# Patient Record
Sex: Female | Born: 1975
Health system: Southern US, Community
[De-identification: ages and names within clinical notes are randomized; demographics above are authoritative.]

## PROBLEM LIST (undated history)

## (undated) ENCOUNTER — Inpatient Hospital Stay (HOSPITAL_COMMUNITY): Payer: 59

## (undated) DIAGNOSIS — M791 Myalgia, unspecified site: Secondary | ICD-10-CM

## (undated) DIAGNOSIS — Z8489 Family history of other specified conditions: Secondary | ICD-10-CM

## (undated) DIAGNOSIS — Z800 Family history of malignant neoplasm of digestive organs: Secondary | ICD-10-CM

## (undated) DIAGNOSIS — F419 Anxiety disorder, unspecified: Secondary | ICD-10-CM

## (undated) DIAGNOSIS — B019 Varicella without complication: Secondary | ICD-10-CM

## (undated) DIAGNOSIS — F32A Depression, unspecified: Secondary | ICD-10-CM

## (undated) DIAGNOSIS — T7840XA Allergy, unspecified, initial encounter: Secondary | ICD-10-CM

## (undated) DIAGNOSIS — N809 Endometriosis, unspecified: Secondary | ICD-10-CM

## (undated) DIAGNOSIS — M1990 Unspecified osteoarthritis, unspecified site: Secondary | ICD-10-CM

## (undated) DIAGNOSIS — G43909 Migraine, unspecified, not intractable, without status migrainosus: Secondary | ICD-10-CM

## (undated) DIAGNOSIS — Z803 Family history of malignant neoplasm of breast: Secondary | ICD-10-CM

## (undated) DIAGNOSIS — Z808 Family history of malignant neoplasm of other organs or systems: Secondary | ICD-10-CM

## (undated) DIAGNOSIS — Z8051 Family history of malignant neoplasm of kidney: Secondary | ICD-10-CM

## (undated) DIAGNOSIS — M545 Low back pain, unspecified: Secondary | ICD-10-CM

## (undated) DIAGNOSIS — K219 Gastro-esophageal reflux disease without esophagitis: Secondary | ICD-10-CM

## (undated) DIAGNOSIS — N390 Urinary tract infection, site not specified: Secondary | ICD-10-CM

## (undated) DIAGNOSIS — J189 Pneumonia, unspecified organism: Secondary | ICD-10-CM

## (undated) DIAGNOSIS — N6019 Diffuse cystic mastopathy of unspecified breast: Secondary | ICD-10-CM

## (undated) DIAGNOSIS — F329 Major depressive disorder, single episode, unspecified: Secondary | ICD-10-CM

## (undated) DIAGNOSIS — C801 Malignant (primary) neoplasm, unspecified: Secondary | ICD-10-CM

## (undated) DIAGNOSIS — C50919 Malignant neoplasm of unspecified site of unspecified female breast: Secondary | ICD-10-CM

## (undated) DIAGNOSIS — M722 Plantar fascial fibromatosis: Secondary | ICD-10-CM

## (undated) DIAGNOSIS — Z923 Personal history of irradiation: Secondary | ICD-10-CM

## (undated) DIAGNOSIS — Z1379 Encounter for other screening for genetic and chromosomal anomalies: Secondary | ICD-10-CM

## (undated) HISTORY — DX: Myalgia, unspecified site: M79.10

## (undated) HISTORY — DX: Low back pain: M54.5

## (undated) HISTORY — DX: Varicella without complication: B01.9

## (undated) HISTORY — PX: BREAST SURGERY: SHX581

## (undated) HISTORY — PX: OTHER SURGICAL HISTORY: SHX169

## (undated) HISTORY — DX: Family history of malignant neoplasm of kidney: Z80.51

## (undated) HISTORY — DX: Plantar fascial fibromatosis: M72.2

## (undated) HISTORY — DX: Migraine, unspecified, not intractable, without status migrainosus: G43.909

## (undated) HISTORY — DX: Family history of malignant neoplasm of breast: Z80.3

## (undated) HISTORY — PX: SHOULDER SURGERY: SHX246

## (undated) HISTORY — DX: Major depressive disorder, single episode, unspecified: F32.9

## (undated) HISTORY — DX: Family history of malignant neoplasm of other organs or systems: Z80.8

## (undated) HISTORY — DX: Urinary tract infection, site not specified: N39.0

## (undated) HISTORY — DX: Depression, unspecified: F32.A

## (undated) HISTORY — DX: Family history of malignant neoplasm of digestive organs: Z80.0

## (undated) HISTORY — DX: Anxiety disorder, unspecified: F41.9

## (undated) HISTORY — DX: Endometriosis, unspecified: N80.9

## (undated) HISTORY — DX: Low back pain, unspecified: M54.50

## (undated) HISTORY — DX: Diffuse cystic mastopathy of unspecified breast: N60.19

## (undated) HISTORY — DX: Allergy, unspecified, initial encounter: T78.40XA

---
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
## 2002-02-24 ENCOUNTER — Encounter: Payer: Self-pay | Admitting: Obstetrics and Gynecology

## 2002-02-24 ENCOUNTER — Encounter: Admission: RE | Admit: 2002-02-24 | Discharge: 2002-02-24 | Payer: Self-pay | Admitting: Obstetrics and Gynecology

## 2002-06-15 HISTORY — PX: LAPAROSCOPY: SHX197

## 2002-09-29 ENCOUNTER — Encounter: Admission: RE | Admit: 2002-09-29 | Discharge: 2002-12-28 | Payer: Self-pay | Admitting: Family Medicine

## 2003-03-24 ENCOUNTER — Encounter: Payer: Self-pay | Admitting: Family Medicine

## 2003-03-24 ENCOUNTER — Encounter: Admission: RE | Admit: 2003-03-24 | Discharge: 2003-03-24 | Payer: Self-pay | Admitting: Family Medicine

## 2003-06-09 ENCOUNTER — Encounter: Payer: Self-pay | Admitting: Obstetrics and Gynecology

## 2003-06-09 ENCOUNTER — Encounter: Admission: RE | Admit: 2003-06-09 | Discharge: 2003-06-09 | Payer: Self-pay | Admitting: Obstetrics and Gynecology

## 2004-02-27 ENCOUNTER — Emergency Department (HOSPITAL_COMMUNITY): Admission: EM | Admit: 2004-02-27 | Discharge: 2004-02-27 | Payer: Self-pay | Admitting: Emergency Medicine

## 2004-07-18 ENCOUNTER — Encounter: Admission: RE | Admit: 2004-07-18 | Discharge: 2004-07-18 | Payer: Self-pay | Admitting: Family Medicine

## 2004-09-27 ENCOUNTER — Ambulatory Visit: Payer: Self-pay | Admitting: *Deleted

## 2004-10-04 ENCOUNTER — Ambulatory Visit: Payer: Self-pay | Admitting: Internal Medicine

## 2004-10-04 ENCOUNTER — Ambulatory Visit: Payer: Self-pay | Admitting: *Deleted

## 2004-10-11 ENCOUNTER — Ambulatory Visit: Payer: Self-pay | Admitting: *Deleted

## 2004-10-18 ENCOUNTER — Ambulatory Visit: Payer: Self-pay | Admitting: *Deleted

## 2004-11-01 ENCOUNTER — Ambulatory Visit: Payer: Self-pay | Admitting: *Deleted

## 2004-11-08 ENCOUNTER — Ambulatory Visit: Payer: Self-pay | Admitting: *Deleted

## 2004-11-15 ENCOUNTER — Ambulatory Visit: Payer: Self-pay | Admitting: *Deleted

## 2004-11-22 ENCOUNTER — Ambulatory Visit: Payer: Self-pay | Admitting: *Deleted

## 2004-11-29 ENCOUNTER — Ambulatory Visit: Payer: Self-pay | Admitting: *Deleted

## 2004-12-26 ENCOUNTER — Ambulatory Visit: Payer: Self-pay | Admitting: *Deleted

## 2005-01-09 ENCOUNTER — Ambulatory Visit: Payer: Self-pay | Admitting: Family Medicine

## 2005-01-20 ENCOUNTER — Ambulatory Visit: Payer: Self-pay | Admitting: *Deleted

## 2005-03-13 ENCOUNTER — Ambulatory Visit: Payer: Self-pay | Admitting: Family Medicine

## 2005-04-21 ENCOUNTER — Ambulatory Visit: Payer: Self-pay | Admitting: Family Medicine

## 2005-04-23 ENCOUNTER — Ambulatory Visit: Payer: Self-pay | Admitting: Family Medicine

## 2005-04-23 ENCOUNTER — Encounter: Admission: RE | Admit: 2005-04-23 | Discharge: 2005-04-23 | Payer: Self-pay | Admitting: Family Medicine

## 2005-05-15 ENCOUNTER — Ambulatory Visit: Payer: Self-pay | Admitting: Family Medicine

## 2005-06-17 ENCOUNTER — Ambulatory Visit: Payer: Self-pay | Admitting: Family Medicine

## 2005-06-23 ENCOUNTER — Ambulatory Visit: Payer: Self-pay | Admitting: Family Medicine

## 2005-08-05 ENCOUNTER — Ambulatory Visit: Payer: Self-pay | Admitting: Family Medicine

## 2005-09-03 ENCOUNTER — Encounter: Payer: Self-pay | Admitting: Family Medicine

## 2005-09-10 ENCOUNTER — Encounter: Admission: RE | Admit: 2005-09-10 | Discharge: 2005-09-10 | Payer: Self-pay | Admitting: Orthopedic Surgery

## 2005-09-12 ENCOUNTER — Ambulatory Visit: Payer: Self-pay | Admitting: Family Medicine

## 2005-09-12 ENCOUNTER — Other Ambulatory Visit: Admission: RE | Admit: 2005-09-12 | Discharge: 2005-09-12 | Payer: Self-pay | Admitting: Family Medicine

## 2005-09-12 ENCOUNTER — Encounter: Payer: Self-pay | Admitting: Family Medicine

## 2005-10-22 ENCOUNTER — Ambulatory Visit: Payer: Self-pay | Admitting: Family Medicine

## 2005-10-28 ENCOUNTER — Encounter: Admission: RE | Admit: 2005-10-28 | Discharge: 2005-10-28 | Payer: Self-pay | Admitting: Orthopedic Surgery

## 2005-11-18 ENCOUNTER — Ambulatory Visit: Payer: Self-pay | Admitting: Family Medicine

## 2006-01-01 ENCOUNTER — Encounter: Admission: RE | Admit: 2006-01-01 | Discharge: 2006-01-01 | Payer: Self-pay | Admitting: Family Medicine

## 2006-01-13 HISTORY — PX: BREAST BIOPSY: SHX20

## 2006-01-15 ENCOUNTER — Encounter (INDEPENDENT_AMBULATORY_CARE_PROVIDER_SITE_OTHER): Payer: Self-pay | Admitting: Specialist

## 2006-01-15 ENCOUNTER — Encounter: Admission: RE | Admit: 2006-01-15 | Discharge: 2006-01-15 | Payer: Self-pay | Admitting: Family Medicine

## 2006-02-12 ENCOUNTER — Ambulatory Visit: Payer: Self-pay | Admitting: Family Medicine

## 2006-03-09 ENCOUNTER — Ambulatory Visit (HOSPITAL_COMMUNITY): Admission: AD | Admit: 2006-03-09 | Discharge: 2006-03-09 | Payer: Self-pay | Admitting: Obstetrics and Gynecology

## 2006-07-22 ENCOUNTER — Ambulatory Visit (HOSPITAL_COMMUNITY): Admission: RE | Admit: 2006-07-22 | Discharge: 2006-07-22 | Payer: Self-pay | Admitting: Obstetrics and Gynecology

## 2006-09-07 ENCOUNTER — Emergency Department (HOSPITAL_COMMUNITY): Admission: EM | Admit: 2006-09-07 | Discharge: 2006-09-07 | Payer: Self-pay | Admitting: Family Medicine

## 2006-09-26 ENCOUNTER — Inpatient Hospital Stay (HOSPITAL_COMMUNITY): Admission: AD | Admit: 2006-09-26 | Discharge: 2006-09-29 | Payer: Self-pay | Admitting: Obstetrics and Gynecology

## 2006-10-28 ENCOUNTER — Ambulatory Visit: Payer: Self-pay | Admitting: Family Medicine

## 2006-11-13 ENCOUNTER — Ambulatory Visit: Payer: Self-pay | Admitting: Family Medicine

## 2006-12-22 ENCOUNTER — Encounter: Payer: Self-pay | Admitting: Family Medicine

## 2006-12-22 DIAGNOSIS — J309 Allergic rhinitis, unspecified: Secondary | ICD-10-CM | POA: Insufficient documentation

## 2006-12-22 DIAGNOSIS — F4323 Adjustment disorder with mixed anxiety and depressed mood: Secondary | ICD-10-CM | POA: Insufficient documentation

## 2006-12-22 DIAGNOSIS — N809 Endometriosis, unspecified: Secondary | ICD-10-CM

## 2006-12-22 DIAGNOSIS — N6019 Diffuse cystic mastopathy of unspecified breast: Secondary | ICD-10-CM

## 2006-12-22 HISTORY — DX: Diffuse cystic mastopathy of unspecified breast: N60.19

## 2006-12-22 HISTORY — DX: Endometriosis, unspecified: N80.9

## 2007-01-08 ENCOUNTER — Ambulatory Visit: Payer: Self-pay | Admitting: Family Medicine

## 2007-01-29 ENCOUNTER — Telehealth (INDEPENDENT_AMBULATORY_CARE_PROVIDER_SITE_OTHER): Payer: Self-pay | Admitting: *Deleted

## 2007-02-10 ENCOUNTER — Ambulatory Visit: Payer: Self-pay | Admitting: Family Medicine

## 2007-02-10 DIAGNOSIS — G4700 Insomnia, unspecified: Secondary | ICD-10-CM | POA: Insufficient documentation

## 2007-02-15 ENCOUNTER — Encounter: Payer: Self-pay | Admitting: Family Medicine

## 2007-03-01 ENCOUNTER — Emergency Department (HOSPITAL_COMMUNITY): Admission: EM | Admit: 2007-03-01 | Discharge: 2007-03-01 | Payer: Self-pay | Admitting: Family Medicine

## 2007-03-17 ENCOUNTER — Encounter: Admission: RE | Admit: 2007-03-17 | Discharge: 2007-03-17 | Payer: Self-pay | Admitting: Orthopedic Surgery

## 2007-04-02 ENCOUNTER — Encounter: Admission: RE | Admit: 2007-04-02 | Discharge: 2007-04-02 | Payer: Self-pay | Admitting: Orthopedic Surgery

## 2007-04-21 ENCOUNTER — Encounter: Admission: RE | Admit: 2007-04-21 | Discharge: 2007-04-21 | Payer: Self-pay | Admitting: Orthopedic Surgery

## 2007-05-04 ENCOUNTER — Telehealth (INDEPENDENT_AMBULATORY_CARE_PROVIDER_SITE_OTHER): Payer: Self-pay | Admitting: *Deleted

## 2007-06-18 ENCOUNTER — Encounter: Payer: Self-pay | Admitting: Family Medicine

## 2007-06-29 ENCOUNTER — Encounter: Payer: Self-pay | Admitting: Family Medicine

## 2007-07-01 ENCOUNTER — Ambulatory Visit: Payer: Self-pay | Admitting: Family Medicine

## 2007-07-01 LAB — CONVERTED CEMR LAB: Rapid Strep: NEGATIVE

## 2007-07-05 ENCOUNTER — Encounter (INDEPENDENT_AMBULATORY_CARE_PROVIDER_SITE_OTHER): Payer: Self-pay | Admitting: Internal Medicine

## 2007-07-16 ENCOUNTER — Ambulatory Visit: Payer: Self-pay | Admitting: Family Medicine

## 2007-07-22 ENCOUNTER — Encounter: Admission: RE | Admit: 2007-07-22 | Discharge: 2007-07-22 | Payer: Self-pay | Admitting: Obstetrics and Gynecology

## 2007-10-22 ENCOUNTER — Telehealth: Payer: Self-pay | Admitting: Family Medicine

## 2007-11-08 ENCOUNTER — Ambulatory Visit: Payer: Self-pay | Admitting: Family Medicine

## 2007-11-09 ENCOUNTER — Telehealth: Payer: Self-pay | Admitting: Family Medicine

## 2007-11-11 ENCOUNTER — Ambulatory Visit: Payer: Self-pay | Admitting: Family Medicine

## 2007-11-13 ENCOUNTER — Ambulatory Visit: Payer: Self-pay | Admitting: Family Medicine

## 2008-05-01 ENCOUNTER — Telehealth: Payer: Self-pay | Admitting: Family Medicine

## 2008-06-30 ENCOUNTER — Ambulatory Visit: Payer: Self-pay | Admitting: Family Medicine

## 2008-12-01 ENCOUNTER — Ambulatory Visit: Payer: Self-pay | Admitting: Family Medicine

## 2008-12-06 ENCOUNTER — Ambulatory Visit: Payer: Self-pay | Admitting: Family Medicine

## 2008-12-07 LAB — CONVERTED CEMR LAB
Basophils Relative: 1.1 % (ref 0.0–3.0)
CO2: 29 meq/L (ref 19–32)
Calcium: 9.1 mg/dL (ref 8.4–10.5)
Creatinine, Ser: 0.7 mg/dL (ref 0.4–1.2)
Eosinophils Relative: 1.5 % (ref 0.0–5.0)
HCT: 42.6 % (ref 36.0–46.0)
Hemoglobin: 14.8 g/dL (ref 12.0–15.0)
Lymphs Abs: 2.7 10*3/uL (ref 0.7–4.0)
Monocytes Absolute: 0.6 10*3/uL (ref 0.1–1.0)
Monocytes Relative: 10.5 % (ref 3.0–12.0)
RBC: 4.73 M/uL (ref 3.87–5.11)
TSH: 0.69 microintl units/mL (ref 0.35–5.50)
WBC: 6.1 10*3/uL (ref 4.5–10.5)

## 2008-12-08 ENCOUNTER — Telehealth (INDEPENDENT_AMBULATORY_CARE_PROVIDER_SITE_OTHER): Payer: Self-pay | Admitting: *Deleted

## 2009-01-08 ENCOUNTER — Telehealth: Payer: Self-pay | Admitting: Family Medicine

## 2009-01-16 ENCOUNTER — Ambulatory Visit: Payer: Self-pay | Admitting: Family Medicine

## 2009-06-28 ENCOUNTER — Telehealth: Payer: Self-pay | Admitting: Family Medicine

## 2009-08-02 ENCOUNTER — Ambulatory Visit: Payer: Self-pay | Admitting: Family Medicine

## 2009-08-02 LAB — CONVERTED CEMR LAB: Beta hcg, urine, semiquantitative: NEGATIVE

## 2009-11-09 ENCOUNTER — Inpatient Hospital Stay (HOSPITAL_COMMUNITY): Admission: AD | Admit: 2009-11-09 | Discharge: 2009-11-09 | Payer: Self-pay | Admitting: Obstetrics and Gynecology

## 2009-12-05 ENCOUNTER — Ambulatory Visit: Payer: Self-pay | Admitting: Family Medicine

## 2009-12-05 LAB — CONVERTED CEMR LAB: Rapid Strep: NEGATIVE

## 2009-12-08 ENCOUNTER — Emergency Department: Payer: Self-pay | Admitting: Emergency Medicine

## 2009-12-11 ENCOUNTER — Ambulatory Visit: Payer: Self-pay | Admitting: Family Medicine

## 2010-04-17 ENCOUNTER — Ambulatory Visit (HOSPITAL_COMMUNITY): Admission: RE | Admit: 2010-04-17 | Discharge: 2010-04-17 | Payer: Self-pay | Admitting: Obstetrics and Gynecology

## 2010-05-29 ENCOUNTER — Inpatient Hospital Stay (HOSPITAL_COMMUNITY): Admission: AD | Admit: 2010-05-29 | Discharge: 2010-05-29 | Payer: Self-pay | Admitting: Obstetrics and Gynecology

## 2010-06-16 ENCOUNTER — Inpatient Hospital Stay (HOSPITAL_COMMUNITY): Admission: AD | Admit: 2010-06-16 | Discharge: 2010-06-18 | Payer: Self-pay | Admitting: Obstetrics and Gynecology

## 2010-08-12 ENCOUNTER — Ambulatory Visit: Payer: Self-pay | Admitting: Family Medicine

## 2010-08-12 DIAGNOSIS — M722 Plantar fascial fibromatosis: Secondary | ICD-10-CM

## 2010-08-12 HISTORY — DX: Plantar fascial fibromatosis: M72.2
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
## 2010-11-11 ENCOUNTER — Encounter: Payer: Self-pay | Admitting: Family Medicine

## 2010-11-26 NOTE — Letter (Signed)
Summary: [facility]   Imported By: Kassie Mends 11/18/2010 10:01:42  _____________________________________________________________________  External Attachment:    Type:   Image     Comment:   External Document

## 2010-11-28 LAB — RH IMMUNE GLOB WKUP(>/=20WKS)(NOT WOMEN'S HOSP)

## 2010-11-28 LAB — GC/CHLAMYDIA PROBE AMP, GENITAL: GC Probe Amp, Genital: NEGATIVE

## 2010-11-28 LAB — URINE CULTURE: Colony Count: 80000

## 2010-11-28 LAB — WET PREP, GENITAL
Clue Cells Wet Prep HPF POC: NONE SEEN
Trich, Wet Prep: NONE SEEN
Yeast Wet Prep HPF POC: NONE SEEN

## 2010-11-28 LAB — CBC
HCT: 35.6 % — ABNORMAL LOW (ref 36.0–46.0)
HCT: 39 % (ref 36.0–46.0)
Hemoglobin: 13.2 g/dL (ref 12.0–15.0)
MCH: 31.6 pg (ref 26.0–34.0)
MCV: 93.1 fL (ref 78.0–100.0)
Platelets: 201 10*3/uL (ref 150–400)
RBC: 3.81 MIL/uL — ABNORMAL LOW (ref 3.87–5.11)
RDW: 13.9 % (ref 11.5–15.5)
WBC: 10 10*3/uL (ref 4.0–10.5)

## 2010-11-28 LAB — STREP B DNA PROBE

## 2010-11-28 LAB — URINALYSIS, ROUTINE W REFLEX MICROSCOPIC
Glucose, UA: NEGATIVE mg/dL
Protein, ur: NEGATIVE mg/dL
Specific Gravity, Urine: 1.01 (ref 1.005–1.030)
Urobilinogen, UA: 0.2 mg/dL (ref 0.0–1.0)

## 2010-11-28 LAB — URINE MICROSCOPIC-ADD ON

## 2010-11-29 LAB — RH IMMUNE GLOBULIN WORKUP (NOT WOMEN'S HOSP): Antibody Screen: NEGATIVE

## 2010-12-04 LAB — HCG, QUANTITATIVE, PREGNANCY: hCG, Beta Chain, Quant, S: 18105 m[IU]/mL — ABNORMAL HIGH (ref <5)

## 2010-12-04 LAB — RH IMMUNE GLOBULIN WORKUP (NOT WOMEN'S HOSP)

## 2011-01-02 ENCOUNTER — Telehealth: Payer: Self-pay | Admitting: *Deleted

## 2011-01-02 NOTE — Telephone Encounter (Signed)
Patient notified as instructed by telephone. Pt already has appt scheduled for 01/20/11. Margaret Rojas put pt on the cancellation list to call if appt becomes available before 01/20/11. Pt also understands if cannot wait until the 7th may possibly be worked in with one of the other drs here.

## 2011-01-02 NOTE — Telephone Encounter (Signed)
Pt is taking 100 mg's of zoloft daily but she is having problems with anxiety. She is asking if she can have something to take in addition to that to help her.  She is losing her job and is having a lot of stress.  [facility].

## 2011-01-02 NOTE — Telephone Encounter (Signed)
Needs office visit to discuss symptoms in detail and make treatment plan

## 2011-01-03 ENCOUNTER — Encounter: Payer: Self-pay | Admitting: Family Medicine

## 2011-01-06 ENCOUNTER — Ambulatory Visit (INDEPENDENT_AMBULATORY_CARE_PROVIDER_SITE_OTHER): Payer: 59 | Admitting: Family Medicine

## 2011-01-06 ENCOUNTER — Encounter: Payer: Self-pay | Admitting: Family Medicine

## 2011-01-06 DIAGNOSIS — F411 Generalized anxiety disorder: Secondary | ICD-10-CM

## 2011-01-06 DIAGNOSIS — L709 Acne, unspecified: Secondary | ICD-10-CM | POA: Insufficient documentation

## 2011-01-06 DIAGNOSIS — G4709 Other insomnia: Secondary | ICD-10-CM

## 2011-01-06 DIAGNOSIS — L708 Other acne: Secondary | ICD-10-CM

## 2011-01-06 MED ORDER — ZOLPIDEM TARTRATE 10 MG PO TABS: 5.0000 mg | ORAL_TABLET | Freq: Every evening | ORAL | Status: DC | PRN

## 2011-01-06 MED ORDER — SERTRALINE HCL 100 MG PO TABS: 150.0000 mg | ORAL_TABLET | Freq: Every day | ORAL | Status: DC

## 2011-01-06 MED ORDER — ALPRAZOLAM 0.5 MG PO TABS: 0.5000 mg | ORAL_TABLET | Freq: Every day | ORAL | Status: DC | PRN

## 2011-01-06 MED ORDER — ADAPALENE-BENZOYL PEROXIDE 0.1-2.5 % EX GEL: 1.0000 | Freq: Every evening | CUTANEOUS | Status: AC | PRN

## 2011-01-06 NOTE — Assessment & Plan Note (Signed)
refil epiduo for prn use on acne/ aff areas Pt is on OC also

## 2011-01-06 NOTE — Assessment & Plan Note (Signed)
Worse with increased situational stressors Overall fair coping techniques Disc opt for tx in detail >25 min spent with face to face with patient counseling and coordinating care  ref to counseling - Dr Laymond Purser if covered Inc zoloft to 150 mg- watching for side eff Xanax .5 for emergencies only-- disc cautions of this - habit forming and sedating - not long term  Also refilled ambien when needed

## 2011-01-06 NOTE — Patient Instructions (Signed)
We will do counseling referral at check out  Increase zoloft to 150 mg - update me if any problems Follow up with me in 3 months Use xanax for emergencies as needed - do not drive or mix with [place]
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
## 2011-01-06 NOTE — Assessment & Plan Note (Signed)
Worsened by stress refil ambien for prn use - will not use on same day as xanax Disc sleep hygiene Counseling ref made

## 2011-01-20 ENCOUNTER — Ambulatory Visit: Payer: Self-pay | Admitting: Family Medicine

## 2011-01-23 ENCOUNTER — Other Ambulatory Visit: Payer: Self-pay | Admitting: *Deleted

## 2011-01-23 MED ORDER — ALPRAZOLAM 0.5 MG PO TABS: 0.5000 mg | ORAL_TABLET | Freq: Every day | ORAL | Status: AC | PRN

## 2011-01-23 NOTE — Telephone Encounter (Signed)
Px written for call in   

## 2011-01-23 NOTE — Telephone Encounter (Signed)
Medication phoned to [facility] as instructed.  

## 2011-01-23 NOTE — Telephone Encounter (Signed)
PLEASE CALL INTO [facility], [facility] IS NOT WORKING.

## 2011-01-24 ENCOUNTER — Other Ambulatory Visit: Payer: Self-pay | Admitting: *Deleted

## 2011-01-24 NOTE — Telephone Encounter (Signed)
Spoke with pharmacist at [facility] and pt did have refills so Kendal Hymen said to disregard refill request.

## 2011-01-24 NOTE — Telephone Encounter (Signed)
Did that just get refilled on 4/23 for 30 with 3 refils?

## 2011-01-28 ENCOUNTER — Ambulatory Visit (INDEPENDENT_AMBULATORY_CARE_PROVIDER_SITE_OTHER): Payer: 59 | Admitting: Psychology

## 2011-01-28 DIAGNOSIS — F4323 Adjustment disorder with mixed anxiety and depressed mood: Secondary | ICD-10-CM

## 2011-01-28 NOTE — Assessment & Plan Note (Signed)
[facility]                                 ON-CALL NOTE   DASJA, BRASE                 MRN:          [id number]  DATE:11/13/2007                            DOB:          Oct 01, 1975    TIME OF CALL:  5:24 p.m.   PHONE NUMBER:  [phone].   OBJECTIVE:  The patient thinks she has pinkeye.  Saw Dr. Milinda Antis on Monday  and saw Everrett Coombe, FNP, yesterday, Friday.  Woke up this morning with  itching.  Works at [employer]. .  Was told by 3 nurses she has pinkeye.   ASSESSMENT:  Upper respiratory infection with possible conjunctivitis.   PLAN:  Come in to be seen.   PRIMARY CARE Trenna Kiely:  Dr. Milinda Antis.  Home office is [facility].     Arta Silence, MD  Electronically Signed    RNS/MedQ  DD: 11/13/2007  DT: 11/14/2007  Job #: [id number]

## 2011-01-28 NOTE — Assessment & Plan Note (Signed)
[facility]                                 ON-CALL NOTE   Margaret Rojas, Margaret Rojas               MRN:          [id number]  DATE:11/12/2007                            DOB:          [birth year]    DATE/TIME:  November 12, 2007 at 5:24 p.m.   PHONE:  [phone].   The patient was seen by Dr. Milinda Antis on Monday with a URI, and then again  yesterday by [facility], has not been given antibiotics and sounds  reasonably clear.  She woke up this morning with itching of her eye  which progressed to erythema throughout the day.  She works at [employer] and was told by 3 nurses that she has pink eye, wants  to know if she can be treated, if I will call in the antibiotic.   OBJECTIVE:  Conjunctivitis, most likely viral versus bacterial.   PLAN:  Need to be seen in order to rule out other things.  Probably will  prescribed antibiotics but want to see her first.  We will see her  tomorrow morning at [facility] at 10:30.   PRIMARY CARE PHYSICIAN:  Dr. Milinda Antis and home office is [facility]     Arta Silence, MD  Electronically Signed    RNS/MedQ  DD: 11/12/2007  DT: 11/13/2007  Job #: [id number]
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
## 2011-02-06 ENCOUNTER — Ambulatory Visit: Payer: 59 | Admitting: Psychology

## 2011-02-12 ENCOUNTER — Ambulatory Visit: Payer: 59 | Admitting: Psychology

## 2011-06-11 ENCOUNTER — Telehealth: Payer: Self-pay | Admitting: *Deleted

## 2011-06-11 NOTE — Telephone Encounter (Signed)
Pt has dropped off a copy of labs from city [place].  She is asking that you review and advise on whether she should be concerned about anything.  This is on your shelf.

## 2011-06-11 NOTE — Telephone Encounter (Signed)
Cholesterol is mildly high with LDL in 150s Avoid red meat/ fried foods/ egg yolks/ fatty breakfast meats/ butter, cheese and high fat dairy/ and shellfish  Come in to disc when able

## 2011-06-13 ENCOUNTER — Telehealth: Payer: Self-pay | Admitting: *Deleted

## 2011-06-13 NOTE — Telephone Encounter (Signed)
Patient advised of results and will schedule appt

## 2011-06-16 NOTE — Telephone Encounter (Signed)
error 

## 2011-07-11 ENCOUNTER — Other Ambulatory Visit: Payer: Self-pay | Admitting: *Deleted

## 2011-07-11 MED ORDER — ZOLPIDEM TARTRATE 10 MG PO TABS: 5.0000 mg | ORAL_TABLET | Freq: Every evening | ORAL | Status: DC | PRN

## 2011-07-11 NOTE — Telephone Encounter (Signed)
Px written for call in   

## 2011-07-11 NOTE — Telephone Encounter (Signed)
Rx called in as directed.   

## 2011-07-11 NOTE — Telephone Encounter (Signed)
Faxed request from [facility] [address], last filled 06/13/11.

## 2011-08-12 ENCOUNTER — Ambulatory Visit (INDEPENDENT_AMBULATORY_CARE_PROVIDER_SITE_OTHER): Payer: PRIVATE HEALTH INSURANCE | Admitting: Family Medicine

## 2011-08-12 ENCOUNTER — Encounter: Payer: Self-pay | Admitting: Family Medicine

## 2011-08-12 DIAGNOSIS — R05 Cough: Secondary | ICD-10-CM

## 2011-08-12 MED ORDER — BENZONATATE 200 MG PO CAPS: 200.0000 mg | ORAL_CAPSULE | Freq: Three times a day (TID) | ORAL | Status: AC | PRN

## 2011-08-12 MED ORDER — AZITHROMYCIN 250 MG PO TABS: ORAL_TABLET | ORAL | Status: AC

## 2011-08-12 NOTE — Progress Notes (Signed)
duration of symptoms: 6 weeks of waxing and waning sx.  Saturday night, vomiting with fever.  [facility] and was told to f/u with PMD or primary clinic.   Rhinorrhea: yes, intermittently Congestion: yes ear pain: no sore throat: yes, from the cough Cough: yes, occ sputum Myalgias: yes, esp in back Fatigued.  No h/o asthma.  No known wheeze.  Pain with deep breath.   Sweats noted by patient.   1 cig a day.  Working on quitting.   ROS: See HPI.  Otherwise negative.    Meds, vitals, and allergies reviewed.   GEN: nad, alert and oriented HEENT: mucous membranes moist, TM w/o erythema, nasal epithelium injected, OP with cobblestoning but no exudates.  NECK: supple w/o LA CV: rrr. PULM: ctab, no inc wob but cough noted ABD: soft, +bs EXT: no edema

## 2011-08-12 NOTE — Patient Instructions (Signed)
Start the zithromax today and this should gradually get better.  Take the tessalon for cough.  Let us know if you don't improve over the next week.

## 2011-08-13 ENCOUNTER — Encounter: Payer: Self-pay | Admitting: Family Medicine

## 2011-08-13 NOTE — Assessment & Plan Note (Signed)
Nontoxic with clear lungs but given the duration, I would treat for a presumed atypical pna and then f/u prn.  She agrees. Tessalon for cough.  Okay for outpatient f/u.

## 2011-09-05 ENCOUNTER — Ambulatory Visit (INDEPENDENT_AMBULATORY_CARE_PROVIDER_SITE_OTHER): Payer: 59 | Admitting: Family Medicine

## 2011-09-05 ENCOUNTER — Encounter: Payer: Self-pay | Admitting: Family Medicine

## 2011-09-05 VITALS — BP 122/76 | HR 72 | Temp 98.4°F | Wt 184.2 lb

## 2011-09-05 DIAGNOSIS — IMO0002 Reserved for concepts with insufficient information to code with codable children: Secondary | ICD-10-CM

## 2011-09-05 DIAGNOSIS — S29011A Strain of muscle and tendon of front wall of thorax, initial encounter: Secondary | ICD-10-CM

## 2011-09-05 MED ORDER — TRAMADOL HCL 50 MG PO TABS: 50.0000 mg | ORAL_TABLET | Freq: Three times a day (TID) | ORAL | Status: AC | PRN

## 2011-09-05 MED ORDER — PSEUDOEPHEDRINE-GUAIFENESIN ER 120-1200 MG PO TB12: 1.0000 | ORAL_TABLET | Freq: Two times a day (BID) | ORAL | Status: DC | PRN

## 2011-09-05 NOTE — Assessment & Plan Note (Signed)
From coughing, lungs clear. Treat with tramadol Update [place] if not improving as expected.

## 2011-09-05 NOTE — Progress Notes (Signed)
  Subjective:    Patient ID: Margaret Rojas, female    DOB: May 23, 1976, 35 y.o.   MRN: [id number]  HPI CC: chest soreness  1 wk h/o left side chest wall pain.  H/o cough for last 1 1/2 mo (see below).  Pain worse with coughing.  Not currently on cough syrup other than robitussin.  Takes ambien at bedtime so hesitant to take stronger med.  Last 2 days noticing increased SOB.  Left chest staying sore.  So far has tried nothing for this other than ibuprofen.  Seen here late 07/2011 with walking PNA, placed on zpack.   08/23/2011 had sinus infection, went to [facility] and placed on augmentin for sinusitis.  Review of Systems Per HPI    Objective:   Physical Exam  Nursing note and vitals reviewed. Constitutional: She appears well-developed and well-nourished. No distress.  HENT:  Head: Normocephalic and atraumatic.  Right Ear: Hearing, tympanic membrane, external ear and ear canal normal.  Left Ear: Hearing, tympanic membrane, external ear and ear canal normal.  Nose: No mucosal edema or rhinorrhea. Right sinus exhibits no maxillary sinus tenderness and no frontal sinus tenderness. Left sinus exhibits no maxillary sinus tenderness and no frontal sinus tenderness.  Mouth/Throat: Uvula is midline, oropharynx is clear and moist and mucous membranes are normal. No oropharyngeal exudate, posterior oropharyngeal edema, posterior oropharyngeal erythema or tonsillar abscesses.  Eyes: Conjunctivae and EOM are normal. Pupils are equal, round, and reactive to light. No scleral icterus.  Neck: Normal range of motion. Neck supple.  Cardiovascular: Normal rate, regular rhythm, normal heart sounds and intact distal pulses.   No murmur heard. Pulmonary/Chest: Effort normal and breath sounds normal. No respiratory distress. She has no wheezes. She has no rales. She exhibits tenderness.       Left intercostal between 3rd/4th ribs chest tender to palpation  Lymphadenopathy:    She has no cervical adenopathy.   Skin: Skin is warm and dry. No rash noted.      Assessment & Plan:

## 2011-09-05 NOTE — Patient Instructions (Signed)
I think yo uhave chest wall strain - pulled muscle. Treat with tramadol.  This should also help as cough suppressant. Update [place] if not improving as expected. Sent in mucinex D as well, use nasal saline as well.

## 2011-09-08 ENCOUNTER — Ambulatory Visit: Payer: PRIVATE HEALTH INSURANCE | Admitting: Family Medicine

## 2011-11-11 ENCOUNTER — Ambulatory Visit: Payer: Self-pay | Admitting: Obstetrics and Gynecology

## 2011-11-24 ENCOUNTER — Ambulatory Visit (INDEPENDENT_AMBULATORY_CARE_PROVIDER_SITE_OTHER): Payer: 59 | Admitting: Obstetrics and Gynecology

## 2011-11-24 DIAGNOSIS — R635 Abnormal weight gain: Secondary | ICD-10-CM

## 2011-11-24 DIAGNOSIS — Z01419 Encounter for gynecological examination (general) (routine) without abnormal findings: Secondary | ICD-10-CM

## 2011-11-24 DIAGNOSIS — R5381 Other malaise: Secondary | ICD-10-CM

## 2011-11-24 DIAGNOSIS — E569 Vitamin deficiency, unspecified: Secondary | ICD-10-CM

## 2011-11-24 DIAGNOSIS — R5383 Other fatigue: Secondary | ICD-10-CM

## 2011-11-28 ENCOUNTER — Other Ambulatory Visit: Payer: Self-pay

## 2011-11-28 MED ORDER — ZOLPIDEM TARTRATE 10 MG PO TABS: 5.0000 mg | ORAL_TABLET | Freq: Every evening | ORAL | Status: DC | PRN

## 2011-11-28 NOTE — Telephone Encounter (Signed)
Px written for call in   

## 2011-11-28 NOTE — Telephone Encounter (Signed)
Medication phoned to [facility] as instructed.  

## 2011-11-28 NOTE — Telephone Encounter (Signed)
[facility] faxed request Zolpidem tartrate 10 mg. Pt last seen 09/05/11.Please advise.

## 2011-12-24 ENCOUNTER — Other Ambulatory Visit: Payer: Self-pay

## 2011-12-24 MED ORDER — NORGESTIM-ETH ESTRAD TRIPHASIC 0.18/0.215/0.25 MG-35 MCG PO TABS: 1.0000 | ORAL_TABLET | Freq: Every day | ORAL | Status: DC

## 2011-12-24 NOTE — Telephone Encounter (Signed)
Pt has written rx for Tri Sprintec good for 1 yr; however, she prefers it to be sent to [facility] due to cost. Informed pt will consult with provider. Spoke with pt informed her rx can be rf at [facility] for $9. Pt states she will try [facility]. Pt will call back if she needs [place] to send [facility].

## 2011-12-25 ENCOUNTER — Telehealth: Payer: Self-pay

## 2011-12-25 NOTE — Telephone Encounter (Signed)
Error

## 2012-01-09 ENCOUNTER — Other Ambulatory Visit: Payer: Self-pay | Admitting: *Deleted

## 2012-01-09 ENCOUNTER — Encounter: Payer: 59 | Admitting: Obstetrics and Gynecology

## 2012-01-09 MED ORDER — SERTRALINE HCL 100 MG PO TABS: ORAL_TABLET | ORAL | Status: DC

## 2012-01-09 NOTE — Telephone Encounter (Signed)
Will refill electronically  Needs to f/u with me this spring or summer please Thanks  I think she takes 11/2 daily- let me know if this is wrong

## 2012-01-09 NOTE — Telephone Encounter (Signed)
Patient advised as instructed via telephone, she stated that she has been trying to cut back and only takes one tablet by mouth.  She will call back to schedule f/u appt.

## 2012-01-09 NOTE — Telephone Encounter (Signed)
Last refilled 10/30/2011.

## 2012-02-23 ENCOUNTER — Ambulatory Visit (INDEPENDENT_AMBULATORY_CARE_PROVIDER_SITE_OTHER): Payer: 59 | Admitting: Family Medicine

## 2012-02-23 ENCOUNTER — Encounter: Payer: Self-pay | Admitting: Family Medicine

## 2012-02-23 VITALS — BP 104/62 | HR 64 | Temp 97.8°F | Ht 68.5 in | Wt 185.2 lb

## 2012-02-23 DIAGNOSIS — L719 Rosacea, unspecified: Secondary | ICD-10-CM

## 2012-02-23 DIAGNOSIS — F411 Generalized anxiety disorder: Secondary | ICD-10-CM

## 2012-02-23 DIAGNOSIS — L710 Perioral dermatitis: Secondary | ICD-10-CM

## 2012-02-23 MED ORDER — ZOLPIDEM TARTRATE 10 MG PO TABS: 5.0000 mg | ORAL_TABLET | Freq: Every evening | ORAL | Status: DC | PRN

## 2012-02-23 MED ORDER — METRONIDAZOLE 1 % EX GEL: Freq: Every day | CUTANEOUS | Status: DC

## 2012-02-23 NOTE — Patient Instructions (Addendum)
Take your remaining 50 mg of zoloft and cut to 25 mg - take that once daily for 2 weeks and then stop  I'm glad you are doing well  Try the metro gel for facial rash and update if not improving
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
## 2012-02-24 ENCOUNTER — Telehealth: Payer: Self-pay

## 2012-02-24 MED ORDER — METRONIDAZOLE 0.75 % EX GEL: Freq: Every day | CUTANEOUS | Status: DC

## 2012-02-24 NOTE — Telephone Encounter (Signed)
What do they recommend? Is is a cost issue - please ask the pharmacist- tell them I am treating peri oral dermatitis thanks

## 2012-02-24 NOTE — Telephone Encounter (Signed)
Px written for call in   Thanks  

## 2012-02-24 NOTE — Telephone Encounter (Signed)
Pt called back about metrogel. Spoke with Tim pharmacist [facility]; he recommends Metronidazole 0.75% gel to apply once daily; is generic and less expensive. Pt will ck with pharmacy later tonight or in AM.Please advise.

## 2012-02-24 NOTE — Telephone Encounter (Signed)
Phoned in to pharmacy/[facility]

## 2012-02-24 NOTE — Telephone Encounter (Signed)
[facility] faxed request alternative med for Metrogel topical 1 % gel. Please advise.

## 2012-02-27 NOTE — Assessment & Plan Note (Signed)
Rev use of products without color or fragrance and imp of sunscreen Px metronizadole gel to try once daily-can adv to bid if needed Update if not starting to improve in a week or if worsening

## 2012-02-27 NOTE — Assessment & Plan Note (Signed)
Doing much better with less situational stress  Will continue to cut zoloft to 25 mg qd for 2 wk and then stop  Update if problems or if mood worsens

## 2012-06-14 ENCOUNTER — Telehealth: Payer: Self-pay | Admitting: Family Medicine

## 2012-06-14 NOTE — Telephone Encounter (Signed)
That is fine with me, thanks

## 2012-06-14 NOTE — Telephone Encounter (Signed)
Margaret Rojas is Dr. Milinda Antis pt. They have moved from [place] to [place], so she would like to transfer to Dr. Selena Batten here at [place]. Please let me know if this is ok with the both of you. Thank you.

## 2012-06-14 NOTE — Telephone Encounter (Signed)
Fine with me. Thanks.   Dr. Selena Batten

## 2012-06-18 ENCOUNTER — Encounter: Payer: Self-pay | Admitting: Family Medicine

## 2012-06-18 ENCOUNTER — Ambulatory Visit (INDEPENDENT_AMBULATORY_CARE_PROVIDER_SITE_OTHER): Payer: 59 | Admitting: Family Medicine

## 2012-06-18 VITALS — BP 102/60 | HR 64 | Temp 97.9°F | Ht 68.5 in | Wt 190.0 lb

## 2012-06-18 DIAGNOSIS — Z23 Encounter for immunization: Secondary | ICD-10-CM

## 2012-06-18 DIAGNOSIS — R5381 Other malaise: Secondary | ICD-10-CM

## 2012-06-18 DIAGNOSIS — R5383 Other fatigue: Secondary | ICD-10-CM

## 2012-06-18 DIAGNOSIS — Z0000 Encounter for general adult medical examination without abnormal findings: Secondary | ICD-10-CM

## 2012-06-18 LAB — LIPID PANEL
Cholesterol: 213 mg/dL — ABNORMAL HIGH (ref 0–200)
HDL: 61.8 mg/dL (ref >39.00)
Total CHOL/HDL Ratio: 3
Triglycerides: 113 mg/dL (ref 0.0–149.0)
VLDL: 22.6 mg/dL (ref 0.0–40.0)

## 2012-06-18 LAB — LDL CHOLESTEROL, DIRECT: Direct LDL: 135.2 mg/dL

## 2012-06-18 NOTE — Patient Instructions (Addendum)
-  We have ordered labs or studies at this visit. It usually takes 1-2 weeks for results and processing. We will contact you with instructions IF your results are abnormal. Normal results will be released to your [facility] in 1-2 weeks. If you have not heard from [place] or can not find your results in [facility] in 2 weeks please contact our office.  -schedule follow up appointment in next month for removal of mole on back - PROCEDURE APPOINTMENT    Thank you for enrolling in MyChart. Please follow the instructions below to securely access your online medical record. MyChart allows you to send messages to your doctor, view your test results, renew your prescriptions, schedule appointments, and more.  How Do I Sign Up? 1. In your Internet browser, go to [email or web address] 2. Click on the New  User? link in the Sign In box.  3. Enter your MyChart Access Code exactly as it appears below. You will not need to use this code after you have completed the sign-up process. If you do not sign up before the expiration date, you must request a new code. MyChart Access Code: [email or web address] Expires: 07/18/2012 10:05 AM  4. Enter the last four digits of your Social Security Number (xxxx) and Date of Birth (mm/dd/yyyy) as indicated and click Next. You will be taken to the next sign-up page. 5. Create a MyChart ID. This will be your MyChart login ID and cannot be changed, so think of one that is secure and easy to remember. 6. Create a MyChart password. You can change your password at any time. 7. Enter your Password Reset Question and Answer and click Next. This can be used at a later time if you forget your password.  8. Select your communication preference, and if applicable enter your e-mail address. You will receive e-mail notification when new information is available in MyChart by choosing to receive e-mail notifications and filling in your e-mail. 9. Click Sign In. You can now view your medical  record.   Additional Information If you have questions, you can email [email or web address] or call [phone] to talk to our MyChart staff. Remember, MyChart is NOT to be used for urgent needs. For medical emergencies, dial 911.
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
## 2012-06-21 NOTE — Progress Notes (Signed)
Quick Note:  Left a message for pt to return call. ______ 

## 2012-06-21 NOTE — Progress Notes (Signed)
Quick Note:  Called and spoke with pt and pt is aware that she needs to contact her insurance to see if nutrition counseling is covered and called the office back and a referral will be placed. Pt is aware of lab results and states she will contact the office to schedule an appt. ______

## 2012-07-07 ENCOUNTER — Telehealth: Payer: Self-pay

## 2012-07-07 ENCOUNTER — Other Ambulatory Visit: Payer: Self-pay

## 2012-07-07 MED ORDER — NORGESTIM-ETH ESTRAD TRIPHASIC 0.18/0.215/0.25 MG-35 MCG PO TABS: 1.0000 | ORAL_TABLET | Freq: Every day | ORAL | Status: DC

## 2012-07-07 NOTE — Telephone Encounter (Signed)
Pt called requesting bcp to be called to [facility] .was doing mail order per EP ok . BCP e prescribed to pharmacy. jm

## 2012-07-09 ENCOUNTER — Ambulatory Visit: Payer: 59 | Admitting: Family Medicine

## 2012-07-15 ENCOUNTER — Encounter: Payer: Self-pay | Admitting: Family Medicine

## 2012-07-15 ENCOUNTER — Ambulatory Visit (INDEPENDENT_AMBULATORY_CARE_PROVIDER_SITE_OTHER): Payer: 59 | Admitting: Family Medicine

## 2012-07-15 VITALS — BP 98/68 | HR 109 | Temp 97.8°F | Wt 190.0 lb

## 2012-07-15 DIAGNOSIS — D239 Other benign neoplasm of skin, unspecified: Secondary | ICD-10-CM

## 2012-07-15 DIAGNOSIS — J329 Chronic sinusitis, unspecified: Secondary | ICD-10-CM

## 2012-07-15 DIAGNOSIS — D229 Melanocytic nevi, unspecified: Secondary | ICD-10-CM

## 2012-07-15 MED ORDER — AMOXICILLIN-POT CLAVULANATE 875-125 MG PO TABS: 1.0000 | ORAL_TABLET | Freq: Two times a day (BID) | ORAL | Status: DC

## 2012-07-15 NOTE — Patient Instructions (Addendum)
SKIN LESION:  -keep area clean and dry for 24 hours -after that bath as usual -let us know immediately if any swelling or redness -we will contact you about result sin 1-2 weeks, call us if you have not heard anything in 2 weeks   INSTRUCTIONS FOR UPPER RESPIRATORY INFECTION:  -plenty of rest and fluids  -take antibiotic with food as instructed  -nasal saline wash 2-3 times daily (use prepackaged nasal saline or bottled/distilled water if making your own)   -can use tylenol or ibuprofen as directed for aches and sorethroat  -in the winter time, using a humidifier at night is helpful (please follow cleaning instructions)  -if you are taking a cough medication - use only as directed, may also try a teaspoon of honey to coat the throat and throat lozenges  -for sore throat, salt water gargles can help  -follow up if you have fevers, are worsening or not getting better in 5-7 days

## 2012-07-15 NOTE — Addendum Note (Signed)
Addended by: Azucena Freed on: 07/15/2012 09:08 AM   Modules accepted: Orders
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
## 2012-07-21 ENCOUNTER — Telehealth: Payer: Self-pay | Admitting: Family Medicine

## 2012-07-21 NOTE — Telephone Encounter (Signed)
Called and spoke with pt. Pt states she will have to call back to schedule this appt.

## 2012-07-21 NOTE — Telephone Encounter (Signed)
Please let patient know:  Mole removed show some atypical cells  - but NO cancer.  I would advise an appointment in 2 months to check this area as there may have been some cells outside of the biopsy. If there is any recurrence of the mole we will plan to remove it at that appointment.  Margaret Rojas,  Please make sure she schedules this appointment. Thanks!

## 2012-07-21 NOTE — Telephone Encounter (Signed)
Left a message for pt to return call 

## 2012-08-18 ENCOUNTER — Telehealth: Payer: Self-pay

## 2012-08-18 DIAGNOSIS — G4700 Insomnia, unspecified: Secondary | ICD-10-CM

## 2012-08-18 MED ORDER — ZOLPIDEM TARTRATE 5 MG PO TABS: 5.0000 mg | ORAL_TABLET | Freq: Every evening | ORAL | Status: DC | PRN

## 2012-08-18 NOTE — Telephone Encounter (Signed)
  Margaret Rojas,  Can refill per order. 5mg  tablets so does not have to break in half. Limited supply. Needs visit for sleep prior to further refills.

## 2012-08-18 NOTE — Telephone Encounter (Signed)
Rx request for ambien 10 mg.  Qty: 15; take 1/2 po hs prn. Last written by M. Tower.  Pls advise.

## 2012-08-18 NOTE — Telephone Encounter (Signed)
Called and spoke with pt and pt is aware that Ambien 5 mg will be faxed to pharmacy and an appt is needed for further refills.

## 2012-11-11 ENCOUNTER — Ambulatory Visit (INDEPENDENT_AMBULATORY_CARE_PROVIDER_SITE_OTHER): Payer: 59 | Admitting: Family Medicine

## 2012-11-11 ENCOUNTER — Encounter: Payer: Self-pay | Admitting: Family Medicine

## 2012-11-11 VITALS — BP 100/70 | HR 90 | Wt 186.0 lb

## 2012-11-11 DIAGNOSIS — F411 Generalized anxiety disorder: Secondary | ICD-10-CM

## 2012-11-11 DIAGNOSIS — D229 Melanocytic nevi, unspecified: Secondary | ICD-10-CM

## 2012-11-11 DIAGNOSIS — D239 Other benign neoplasm of skin, unspecified: Secondary | ICD-10-CM

## 2012-11-11 DIAGNOSIS — L709 Acne, unspecified: Secondary | ICD-10-CM

## 2012-11-11 DIAGNOSIS — L708 Other acne: Secondary | ICD-10-CM

## 2012-11-11 DIAGNOSIS — G4700 Insomnia, unspecified: Secondary | ICD-10-CM

## 2012-11-11 MED ORDER — LORAZEPAM 0.5 MG PO TABS: ORAL_TABLET | ORAL | Status: DC

## 2012-11-11 MED ORDER — ADAPALENE-BENZOYL PEROXIDE 0.1-2.5 % EX GEL: CUTANEOUS | Status: DC

## 2012-11-11 NOTE — Patient Instructions (Signed)
-  We placed a referral for you as discussed to the dermatologist. It usually takes about 1-2 weeks to process and schedule this referral. If you have not heard from [place] regarding this appointment in 2 weeks please contact our office.  -for sleep and anxiety: -regular daily exercise -sleep hygeine - cool dark room -bed is for sleep only -get out of bed if tossing and turning more the 15-20 minutes and journal thoughts -use ambien OR ativan very sparingly and do not use together -get counseling  Follow up in 3 months
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
## 2012-12-13 ENCOUNTER — Encounter: Payer: Self-pay | Admitting: Family Medicine

## 2012-12-13 NOTE — Progress Notes (Signed)
Received OV note from dermatology from 3/24 - mole excised.

## 2013-03-16 ENCOUNTER — Encounter: Payer: Self-pay | Admitting: Family Medicine

## 2013-03-16 DIAGNOSIS — G43009 Migraine without aura, not intractable, without status migrainosus: Secondary | ICD-10-CM | POA: Insufficient documentation

## 2013-03-16 DIAGNOSIS — G43909 Migraine, unspecified, not intractable, without status migrainosus: Secondary | ICD-10-CM

## 2013-03-16 HISTORY — DX: Migraine, unspecified, not intractable, without status migrainosus: G43.909

## 2013-03-16 NOTE — Progress Notes (Signed)
REceived OV note from Dr. Neale Burly in Neurology for Migraines. Starting topamax. Using zomig. Scanned in.

## 2013-04-24 ENCOUNTER — Emergency Department (INDEPENDENT_AMBULATORY_CARE_PROVIDER_SITE_OTHER): Payer: 59

## 2013-04-24 ENCOUNTER — Emergency Department (HOSPITAL_COMMUNITY)
Admission: EM | Admit: 2013-04-24 | Discharge: 2013-04-24 | Disposition: A | Discharge status: Home / Self Care | Payer: 59 | Source: Home / Self Care | Attending: Family Medicine | Admitting: Family Medicine

## 2013-04-24 ENCOUNTER — Encounter (HOSPITAL_COMMUNITY): Payer: Self-pay | Admitting: *Deleted

## 2013-04-24 DIAGNOSIS — J189 Pneumonia, unspecified organism: Secondary | ICD-10-CM

## 2013-04-24 LAB — POCT I-STAT, CHEM 8
Creatinine, Ser: 1 mg/dL (ref 0.50–1.10)
Glucose, Bld: 101 mg/dL — ABNORMAL HIGH (ref 70–99)
HCT: 43 % (ref 36.0–46.0)
Hemoglobin: 14.6 g/dL (ref 12.0–15.0)
Potassium: 3.4 mEq/L — ABNORMAL LOW (ref 3.5–5.1)
Sodium: 138 mEq/L (ref 135–145)
TCO2: 21 mmol/L (ref 0–100)

## 2013-04-24 LAB — CBC WITH DIFFERENTIAL/PLATELET
Basophils Relative: 1 % (ref 0–1)
Eosinophils Absolute: 0.1 10*3/uL (ref 0.0–0.7)
Eosinophils Relative: 2 % (ref 0–5)
Lymphs Abs: 2.2 10*3/uL (ref 0.7–4.0)
MCH: 30.2 pg (ref 26.0–34.0)
MCHC: 34.2 g/dL (ref 30.0–36.0)
MCV: 88.4 fL (ref 78.0–100.0)
Monocytes Relative: 15 % — ABNORMAL HIGH (ref 3–12)
Neutrophils Relative %: 43 % (ref 43–77)
Platelets: 201 10*3/uL (ref 150–400)
RBC: 4.73 MIL/uL (ref 3.87–5.11)

## 2013-04-24 MED ORDER — CEFTRIAXONE SODIUM 1 G IJ SOLR
INTRAMUSCULAR | Status: AC
Start: 2013-04-24 — End: 2013-04-24
  Filled 2013-04-24: qty 10

## 2013-04-24 MED ORDER — AZITHROMYCIN 250 MG PO TABS: ORAL_TABLET | ORAL | Status: DC

## 2013-04-24 MED ORDER — ONDANSETRON 4 MG PO TBDP
8.0000 mg | ORAL_TABLET | Freq: Once | ORAL | Status: AC
  Administered 2013-04-24: 8 mg via ORAL

## 2013-04-24 MED ORDER — ONDANSETRON 4 MG PO TBDP: 4.0000 mg | ORAL_TABLET | Freq: Four times a day (QID) | ORAL | Status: DC | PRN

## 2013-04-24 MED ORDER — METHYLPREDNISOLONE 4 MG PO KIT: PACK | ORAL | Status: DC

## 2013-04-24 MED ORDER — CEFTRIAXONE SODIUM 1 G IJ SOLR
1.0000 g | Freq: Once | INTRAMUSCULAR | Status: AC
  Administered 2013-04-24: 1 g via INTRAMUSCULAR

## 2013-04-24 MED ORDER — DEXAMETHASONE SODIUM PHOSPHATE 10 MG/ML IJ SOLN
10.0000 mg | Freq: Once | INTRAMUSCULAR | Status: AC
  Administered 2013-04-24: 10 mg via INTRAMUSCULAR

## 2013-04-24 MED ORDER — GUAIFENESIN-CODEINE 100-10 MG/5ML PO SOLN: 5.0000 mL | Freq: Three times a day (TID) | ORAL | Status: DC | PRN

## 2013-04-24 MED ORDER — DEXAMETHASONE SODIUM PHOSPHATE 10 MG/ML IJ SOLN
INTRAMUSCULAR | Status: AC
  Filled 2013-04-24: qty 1

## 2013-04-24 MED ORDER — ONDANSETRON 4 MG PO TBDP
ORAL_TABLET | ORAL | Status: AC
  Filled 2013-04-24: qty 2

## 2013-04-24 NOTE — ED Notes (Signed)
Pt  Reports   Cough       Body  Aches          With  Some  Nausea   As  Well    Pt  Reports  Was  Seen  3  Days  Ago  And  Was  Dx  With a  Virus  And a  uti   She  Continues to  Have symptoms     And  The  Cough is  Worse     She  Is  Sitting  Upright on  Exam table  Speaking in  Complete   sentances  And  Is  In no  Severe  Distress
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
## 2013-04-25 NOTE — ED Provider Notes (Signed)
Medical screening examination/treatment/procedure(s) were performed by non-physician practitioner and as supervising physician I was immediately available for consultation/collaboration.   [facility]; MDMedical screening examination/treatment/procedure(s) were performed by non-physician practitioner and as supervising physician I was immediately available for consultation/collaboration.   [facility]; MD  Sharin Grave, MD 04/25/13 [phone]

## 2013-05-10 ENCOUNTER — Ambulatory Visit (INDEPENDENT_AMBULATORY_CARE_PROVIDER_SITE_OTHER): Payer: 59 | Admitting: Family Medicine

## 2013-05-10 ENCOUNTER — Encounter: Payer: Self-pay | Admitting: Family Medicine

## 2013-05-10 ENCOUNTER — Ambulatory Visit (INDEPENDENT_AMBULATORY_CARE_PROVIDER_SITE_OTHER)
Admission: RE | Admit: 2013-05-10 | Discharge: 2013-05-10 | Disposition: A | Discharge status: Home / Self Care | Payer: 59 | Source: Ambulatory Visit | Attending: Family Medicine | Admitting: Family Medicine

## 2013-05-10 VITALS — BP 102/62 | HR 87 | Temp 98.7°F | Ht 68.5 in | Wt 180.2 lb

## 2013-05-10 DIAGNOSIS — J189 Pneumonia, unspecified organism: Secondary | ICD-10-CM

## 2013-05-10 DIAGNOSIS — G4700 Insomnia, unspecified: Secondary | ICD-10-CM

## 2013-05-10 DIAGNOSIS — G4709 Other insomnia: Secondary | ICD-10-CM

## 2013-05-10 MED ORDER — ZOLPIDEM TARTRATE 5 MG PO TABS: 5.0000 mg | ORAL_TABLET | Freq: Every evening | ORAL | Status: DC | PRN

## 2013-05-10 NOTE — Assessment & Plan Note (Signed)
S/p ED visit on 8/10 - reviewed note and studies in detail  Improved after Zpack/ medrol and cough med-now just tired with residual dry cough Will adv activity as tolerated  cxr today  Will plan nurse visit in 1 mo for flu and pneumonia vaccines at the same time
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
## 2013-05-10 NOTE — Assessment & Plan Note (Signed)
Refilled ambien as her insomnia was worse on prednisone and she needed to use more of it  Will update if no improvement

## 2013-05-10 NOTE — Patient Instructions (Addendum)
Chest xray re check today  I'm very glad you are no longer smoking  Schedule nurse visit in about a month for a flu shot and pneumonia shot at the same time  Get rest/ drink fluids/ and take care of yourself

## 2013-06-06 ENCOUNTER — Encounter (HOSPITAL_COMMUNITY): Payer: Self-pay

## 2013-06-06 ENCOUNTER — Emergency Department (HOSPITAL_COMMUNITY)
Admission: EM | Admit: 2013-06-06 | Discharge: 2013-06-06 | Disposition: A | Discharge status: Home / Self Care | Payer: 59 | Source: Home / Self Care

## 2013-06-06 DIAGNOSIS — J329 Chronic sinusitis, unspecified: Secondary | ICD-10-CM

## 2013-06-06 MED ORDER — AMOXICILLIN-POT CLAVULANATE 875-125 MG PO TABS: 1.0000 | ORAL_TABLET | Freq: Two times a day (BID) | ORAL | Status: DC
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
## 2013-06-06 NOTE — ED Notes (Signed)
C/o continued cough, congestion

## 2013-06-07 NOTE — ED Provider Notes (Signed)
Medical screening examination/treatment/procedure(s) were performed by a resident physician or non-physician practitioner and as the supervising physician I was immediately available for consultation/collaboration.  Evan Corey, MD    Evan S Corey, MD 06/07/13 0811 

## 2013-06-23 ENCOUNTER — Ambulatory Visit: Payer: 59

## 2013-06-23 ENCOUNTER — Ambulatory Visit (INDEPENDENT_AMBULATORY_CARE_PROVIDER_SITE_OTHER): Payer: 59 | Admitting: *Deleted

## 2013-06-23 DIAGNOSIS — Z23 Encounter for immunization: Secondary | ICD-10-CM

## 2013-06-25 ENCOUNTER — Encounter: Payer: Self-pay | Admitting: Family Medicine

## 2013-06-25 ENCOUNTER — Ambulatory Visit (INDEPENDENT_AMBULATORY_CARE_PROVIDER_SITE_OTHER): Payer: 59 | Admitting: Family Medicine

## 2013-06-25 VITALS — BP 110/72 | HR 81 | Temp 98.2°F

## 2013-06-25 DIAGNOSIS — T7840XA Allergy, unspecified, initial encounter: Secondary | ICD-10-CM

## 2013-06-25 MED ORDER — CEPHALEXIN 500 MG PO CAPS: 500.0000 mg | ORAL_CAPSULE | Freq: Three times a day (TID) | ORAL | Status: AC

## 2013-06-25 NOTE — Progress Notes (Signed)
  Subjective:    Patient ID: Margaret Rojas, female    DOB: Mar 05, 1976, 37 y.o.   MRN: [id number]  HPI Here for a shot reaction in the left upper arm. On 06-22-13 she received a flu shot and a Pneumovax shot. She felt mildly achy last night but no SOB, fever, etc. Then this am the left upper arm was red, swollen, and painful. She is taking Ibuprofen.    Review of Systems  Constitutional: Negative.   HENT: Negative.   Respiratory: Negative.   Cardiovascular: Negative.   Skin: Positive for color change.  Neurological: Negative.        Objective:   Physical Exam  Constitutional: She appears well-developed and well-nourished. No distress.  Skin:  The left upper arm has areas of erythema, warmth, swelling and tenderness          Assessment & Plan:  Local reaction to a pneumonia vaccine. Use ice packs, Benadryl, and Ibuprofen prn. Cover for any cellulitis with Keflex.

## 2013-07-11 ENCOUNTER — Ambulatory Visit (INDEPENDENT_AMBULATORY_CARE_PROVIDER_SITE_OTHER): Payer: 59 | Admitting: Family Medicine

## 2013-07-11 ENCOUNTER — Encounter: Payer: Self-pay | Admitting: Family Medicine

## 2013-07-11 VITALS — BP 136/84 | HR 90 | Temp 98.7°F | Ht 68.5 in | Wt 176.0 lb

## 2013-07-11 DIAGNOSIS — Z13220 Encounter for screening for lipoid disorders: Secondary | ICD-10-CM

## 2013-07-11 DIAGNOSIS — J189 Pneumonia, unspecified organism: Secondary | ICD-10-CM

## 2013-07-11 DIAGNOSIS — R5381 Other malaise: Secondary | ICD-10-CM | POA: Insufficient documentation

## 2013-07-11 DIAGNOSIS — G4700 Insomnia, unspecified: Secondary | ICD-10-CM

## 2013-07-11 DIAGNOSIS — J069 Acute upper respiratory infection, unspecified: Secondary | ICD-10-CM

## 2013-07-11 DIAGNOSIS — F430 Acute stress reaction: Secondary | ICD-10-CM

## 2013-07-11 LAB — CBC WITH DIFFERENTIAL/PLATELET
Basophils Absolute: 0 10*3/uL (ref 0.0–0.1)
Eosinophils Absolute: 0 10*3/uL (ref 0.0–0.7)
Lymphocytes Relative: 29.9 % (ref 12.0–46.0)
MCHC: 33.8 g/dL (ref 30.0–36.0)
Monocytes Relative: 6.6 % (ref 3.0–12.0)
Neutrophils Relative %: 62.7 % (ref 43.0–77.0)
RDW: 13.4 % (ref 11.5–14.6)

## 2013-07-11 LAB — COMPREHENSIVE METABOLIC PANEL
ALT: 20 U/L (ref 0–35)
AST: 16 U/L (ref 0–37)
Albumin: 3.9 g/dL (ref 3.5–5.2)
CO2: 25 mEq/L (ref 19–32)
Calcium: 8.9 mg/dL (ref 8.4–10.5)
Chloride: 105 mEq/L (ref 96–112)
GFR: 74.72 mL/min (ref >60.00)
Potassium: 3.9 mEq/L (ref 3.5–5.1)
Sodium: 140 mEq/L (ref 135–145)
Total Protein: 7.6 g/dL (ref 6.0–8.3)

## 2013-07-11 LAB — LIPID PANEL
Cholesterol: 235 mg/dL — ABNORMAL HIGH (ref 0–200)
Total CHOL/HDL Ratio: 3
VLDL: 16.6 mg/dL (ref 0.0–40.0)

## 2013-07-11 LAB — LDL CHOLESTEROL, DIRECT: Direct LDL: 154.6 mg/dL

## 2013-07-11 MED ORDER — ZOLPIDEM TARTRATE 5 MG PO TABS: 5.0000 mg | ORAL_TABLET | Freq: Every evening | ORAL | Status: DC | PRN

## 2013-07-11 NOTE — Assessment & Plan Note (Signed)
Viral - with reassuring exam In light of multiple illnesses (? Post viral syndrome) and fatigue-lab today

## 2013-07-11 NOTE — Assessment & Plan Note (Signed)
Difficult marital situaiton Reviewed stressors/ coping techniques/symptoms/ support sources/ tx options and side effects in detail today  She plans to solve some issues soon
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
## 2013-07-11 NOTE — Patient Instructions (Signed)
Work on your stress level  If you want to see a counselor let me know  Lab today Rest/fluids/ good nutrition

## 2013-07-11 NOTE — Assessment & Plan Note (Signed)
Diff incl viral/ post viral syndrome/emotional situational stress  Lab today Reviewed stressors/ coping techniques/symptoms/ support sources/ tx options and side effects in detail today  Offered counseling if she needs it

## 2013-07-11 NOTE — Assessment & Plan Note (Signed)
Pt needs this for work Good diet and exericse  Lab today

## 2013-07-12 ENCOUNTER — Encounter: Payer: Self-pay | Admitting: *Deleted

## 2013-07-15 ENCOUNTER — Telehealth: Payer: Self-pay

## 2013-07-15 NOTE — Telephone Encounter (Signed)
Pt requests 07/11/13 lab results. Patient notified as instructed by telephone from result note.

## 2013-11-11 ENCOUNTER — Encounter (HOSPITAL_COMMUNITY): Payer: Self-pay | Admitting: Emergency Medicine

## 2013-11-11 ENCOUNTER — Emergency Department (INDEPENDENT_AMBULATORY_CARE_PROVIDER_SITE_OTHER): Payer: 59

## 2013-11-11 ENCOUNTER — Emergency Department (HOSPITAL_COMMUNITY)
Admission: EM | Admit: 2013-11-11 | Discharge: 2013-11-11 | Disposition: A | Discharge status: Home / Self Care | Payer: 59 | Source: Home / Self Care | Attending: Family Medicine | Admitting: Family Medicine

## 2013-11-11 DIAGNOSIS — M25539 Pain in unspecified wrist: Secondary | ICD-10-CM
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
## 2013-11-11 NOTE — Discharge Instructions (Signed)
Thank you for coming in today. Take up to 2 Aleve twice daily for pain Followup with your primary care Dr. if not improving in about one or 2 weeks Wrist Pain Wrist injuries are frequent in adults and children. A sprain is an injury to the ligaments that hold your bones together. A strain is an injury to muscle or muscle cord-like structures (tendons) from stretching or pulling. Generally, when wrists are moderately tender to touch following a fall or injury, a break in the bone (fracture) may be present. Most wrist sprains or strains are better in 3 to 5 days, but complete healing may take several weeks. HOME CARE INSTRUCTIONS   Put ice on the injured area.  Put ice in a plastic bag.  Place a towel between your skin and the bag.  Leave the ice on for 15-20 minutes, 03-04 times a day, for the first 2 days.  Keep your arm raised above the level of your heart whenever possible to reduce swelling and pain.  Rest the injured area for at least 48 hours or as directed by your caregiver.  If a splint or elastic bandage has been applied, use it for as long as directed by your caregiver or until seen by a caregiver for a follow-up exam.  Only take over-the-counter or prescription medicines for pain, discomfort, or fever as directed by your caregiver.  Keep all follow-up appointments. You may need to follow up with a specialist or have follow-up X-rays. Improvement in pain level is not a guarantee that you did not fracture a bone in your wrist. The only way to determine whether or not you have a broken bone is by X-ray. SEEK IMMEDIATE MEDICAL CARE IF:   Your fingers are swollen, very red, white, or cold and blue.  Your fingers are numb or tingling.  You have increasing pain.  You have difficulty moving your fingers. MAKE SURE YOU:   Understand these instructions.  Will watch your condition.  Will get help right away if you are not doing well or get worse. Document Released: 06/11/2005  Document Revised: 11/24/2011 Document Reviewed: 10/23/2010 [facility] Patient Information 2014 [place].

## 2013-11-11 NOTE — ED Notes (Signed)
Reports falling on right wrist this a.m around 7:30.   Pain is felt in center of wrist.

## 2013-11-14 ENCOUNTER — Other Ambulatory Visit: Payer: Self-pay | Admitting: Family Medicine

## 2013-11-15 NOTE — Telephone Encounter (Signed)
Electronic refill request, please advise  

## 2013-11-15 NOTE — Telephone Encounter (Signed)
Px written for call in   

## 2013-11-15 NOTE — Telephone Encounter (Signed)
Rx called in as prescribed 

## 2013-11-16 ENCOUNTER — Encounter: Payer: Self-pay | Admitting: Radiology

## 2013-11-17 ENCOUNTER — Encounter: Payer: Self-pay | Admitting: Internal Medicine

## 2013-11-17 ENCOUNTER — Ambulatory Visit (INDEPENDENT_AMBULATORY_CARE_PROVIDER_SITE_OTHER): Payer: 59 | Admitting: Internal Medicine

## 2013-11-17 VITALS — BP 114/70 | HR 85 | Temp 98.5°F | Wt 178.8 lb

## 2013-11-17 DIAGNOSIS — L708 Other acne: Secondary | ICD-10-CM

## 2013-11-17 DIAGNOSIS — K219 Gastro-esophageal reflux disease without esophagitis: Secondary | ICD-10-CM

## 2013-11-17 DIAGNOSIS — L709 Acne, unspecified: Secondary | ICD-10-CM

## 2013-11-17 MED ORDER — OMEPRAZOLE 40 MG PO CPDR: 40.0000 mg | DELAYED_RELEASE_CAPSULE | Freq: Every day | ORAL | Status: DC

## 2013-11-17 MED ORDER — ONDANSETRON HCL 4 MG PO TABS: 4.0000 mg | ORAL_TABLET | Freq: Three times a day (TID) | ORAL | Status: DC | PRN

## 2013-11-17 MED ORDER — ADAPALENE-BENZOYL PEROXIDE 0.1-2.5 % EX GEL: CUTANEOUS | Status: DC

## 2013-11-17 NOTE — Progress Notes (Signed)
Pre visit review using our clinic review tool, if applicable. No additional management support is needed unless otherwise documented below in the visit note. 
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
## 2013-11-17 NOTE — Patient Instructions (Addendum)
Gastroesophageal Reflux Disease, Adult  Gastroesophageal reflux disease (GERD) happens when acid from your stomach flows up into the esophagus. When acid comes in contact with the esophagus, the acid causes soreness (inflammation) in the esophagus. Over time, GERD may create small holes (ulcers) in the lining of the esophagus.  CAUSES   · Increased body weight. This puts pressure on the stomach, making acid rise from the stomach into the esophagus.  · Smoking. This increases acid production in the stomach.  · Drinking alcohol. This causes decreased pressure in the lower esophageal sphincter (valve or ring of muscle between the esophagus and stomach), allowing acid from the stomach into the esophagus.  · Late evening meals and a full stomach. This increases pressure and acid production in the stomach.  · A malformed lower esophageal sphincter.  Sometimes, no cause is found.  SYMPTOMS   · Burning pain in the lower part of the mid-chest behind the breastbone and in the mid-stomach area. This may occur twice a week or more often.  · Trouble swallowing.  · Sore throat.  · Dry cough.  · Asthma-like symptoms including chest tightness, shortness of breath, or wheezing.  DIAGNOSIS   Your caregiver may be able to diagnose GERD based on your symptoms. In some cases, X-rays and other tests may be done to check for complications or to check the condition of your stomach and esophagus.  TREATMENT   Your caregiver may recommend over-the-counter or prescription medicines to help decrease acid production. Ask your caregiver before starting or adding any new medicines.   HOME CARE INSTRUCTIONS   · Change the factors that you can control. Ask your caregiver for guidance concerning weight loss, quitting smoking, and alcohol consumption.  · Avoid foods and drinks that make your symptoms worse, such as:  · Caffeine or alcoholic drinks.  · Chocolate.  · Peppermint or mint flavorings.  · Garlic and onions.  · Spicy foods.  · Citrus fruits,  such as oranges, lemons, or limes.  · Tomato-based foods such as sauce, chili, salsa, and pizza.  · Fried and fatty foods.  · Avoid lying down for the 3 hours prior to your bedtime or prior to taking a nap.  · Eat small, frequent meals instead of large meals.  · Wear loose-fitting clothing. Do not wear anything tight around your waist that causes pressure on your stomach.  · Raise the head of your bed 6 to 8 inches with wood blocks to help you sleep. Extra pillows will not help.  · Only take over-the-counter or prescription medicines for pain, discomfort, or fever as directed by your caregiver.  · Do not take aspirin, ibuprofen, or other nonsteroidal anti-inflammatory drugs (NSAIDs).  SEEK IMMEDIATE MEDICAL CARE IF:   · You have pain in your arms, neck, jaw, teeth, or back.  · Your pain increases or changes in intensity or duration.  · You develop nausea, vomiting, or sweating (diaphoresis).  · You develop shortness of breath, or you faint.  · Your vomit is green, yellow, black, or looks like coffee grounds or blood.  · Your stool is red, bloody, or black.  These symptoms could be signs of other problems, such as heart disease, gastric bleeding, or esophageal bleeding.  MAKE SURE YOU:   · Understand these instructions.  · Will watch your condition.  · Will get help right away if you are not doing well or get worse.  Document Released: 06/11/2005 Document Revised: 11/24/2011 Document Reviewed: 03/21/2011  [employer]® Patient   Information ©2014 [employer].

## 2013-11-18 ENCOUNTER — Telehealth: Payer: Self-pay | Admitting: Family Medicine

## 2013-11-18 NOTE — Telephone Encounter (Signed)
Relevant patient education assigned to patient using Emmi. ° °

## 2013-12-08 ENCOUNTER — Encounter: Payer: Self-pay | Admitting: Radiology

## 2013-12-09 ENCOUNTER — Ambulatory Visit (INDEPENDENT_AMBULATORY_CARE_PROVIDER_SITE_OTHER): Payer: 59 | Admitting: Family Medicine

## 2013-12-09 ENCOUNTER — Encounter: Payer: Self-pay | Admitting: Family Medicine

## 2013-12-09 VITALS — BP 120/68 | HR 65 | Temp 98.0°F | Ht 68.5 in | Wt 185.5 lb

## 2013-12-09 DIAGNOSIS — K219 Gastro-esophageal reflux disease without esophagitis: Secondary | ICD-10-CM

## 2013-12-09 DIAGNOSIS — F430 Acute stress reaction: Secondary | ICD-10-CM

## 2013-12-09 NOTE — Patient Instructions (Signed)
I'm glad the stress at work is about to get better  Say no to the external obligations you need to and take some time for yourself  Continue prilosec 40 until the bottle is gone  Then get prilosec 20 otc and take one daily for 1 month and then one every other day for 2 weeks and then stop it if you can  Watch diet  Get back to exercise when you can  Update if not starting to improve in a week or if worsening    Diet for Gastroesophageal Reflux Disease, Adult Reflux (acid reflux) is when acid from your stomach flows up into the esophagus. When acid comes in contact with the esophagus, the acid causes irritation and soreness (inflammation) in the esophagus. When reflux happens often or so severely that it causes damage to the esophagus, it is called gastroesophageal reflux disease (GERD). Nutrition therapy can help ease the discomfort of GERD. FOODS OR DRINKS TO AVOID OR LIMIT  Smoking or chewing tobacco. Nicotine is one of the most potent stimulants to acid production in the gastrointestinal tract.  Caffeinated and decaffeinated coffee and black tea.  Regular or low-calorie carbonated beverages or energy drinks (caffeine-free carbonated beverages are allowed).   Strong spices, such as black pepper, white pepper, red pepper, cayenne, curry powder, and chili powder.  Peppermint or spearmint.  Chocolate.  High-fat foods, including meats and fried foods. Extra added fats including oils, butter, salad dressings, and nuts. Limit these to less than 8 tsp per day.  Fruits and vegetables if they are not tolerated, such as citrus fruits or tomatoes.  Alcohol.  Any food that seems to aggravate your condition. If you have questions regarding your diet, call your caregiver or a registered dietitian. OTHER THINGS THAT MAY HELP GERD INCLUDE:   Eating your meals slowly, in a relaxed setting.  Eating 5 to 6 small meals per day instead of 3 large meals.  Eliminating food for a period of time if  it causes distress.  Not lying down until 3 hours after eating a meal.  Keeping the head of your bed raised 6 to 9 inches (15 to 23 cm) by using a foam wedge or blocks under the legs of the bed. Lying flat may make symptoms worse.  Being physically active. Weight loss may be helpful in reducing reflux in overweight or obese adults.  Wear loose fitting clothing EXAMPLE MEAL PLAN This meal plan is approximately 2,000 calories based on [email or web address] meal planning guidelines. Breakfast   cup cooked oatmeal.  1 cup strawberries.  1 cup low-fat milk.  1 oz almonds. Snack  1 cup cucumber slices.  6 oz yogurt (made from low-fat or fat-free milk). Lunch  2 slice whole-wheat bread.  2 oz sliced [place].  2 tsp mayonnaise.  1 cup blueberries.  1 cup snap peas. Snack  6 whole-wheat crackers.  1 oz string cheese. Dinner   cup brown rice.  1 cup mixed veggies.  1 tsp olive oil.  3 oz grilled fish. Document Released: 09/01/2005 Document Revised: 11/24/2011 Document Reviewed: 07/18/2011 [facility] Patient Information 2014 [place], [place].

## 2013-12-09 NOTE — Progress Notes (Signed)
Pre visit review using our clinic review tool, if applicable. No additional management support is needed unless otherwise documented below in the visit note. 
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
## 2013-12-11 NOTE — Assessment & Plan Note (Signed)
Suspect this is linked to stress Rev diet for GERD  Will continue prilosec 40 until done with this bottle - then wean to 20 / 20 QOD and eventually stop if tolerated F/u discussed

## 2013-12-11 NOTE — Assessment & Plan Note (Signed)
Reviewed stressors/ coping techniques/symptoms/ support sources/ tx options and side effects in detail today  We came up with many interventions to help dec stress in her life- and work situation stands to improve Pt will call upon help as needed Offered counseling  Suspect GI symptoms will imp with mood >25 minutes spent in face to face time with patient, >50% spent in counselling or coordination of care

## 2014-01-13 ENCOUNTER — Encounter: Payer: Self-pay | Admitting: Family Medicine

## 2014-03-08 ENCOUNTER — Encounter (HOSPITAL_COMMUNITY): Payer: Self-pay | Admitting: Emergency Medicine

## 2014-03-08 ENCOUNTER — Emergency Department (HOSPITAL_COMMUNITY)
Admission: EM | Admit: 2014-03-08 | Discharge: 2014-03-08 | Disposition: A | Discharge status: Home / Self Care | Payer: 59 | Source: Home / Self Care | Attending: Family Medicine | Admitting: Family Medicine

## 2014-03-08 DIAGNOSIS — J0100 Acute maxillary sinusitis, unspecified: Secondary | ICD-10-CM

## 2014-03-08 DIAGNOSIS — J0101 Acute recurrent maxillary sinusitis: Secondary | ICD-10-CM

## 2014-03-08 MED ORDER — AMOXICILLIN-POT CLAVULANATE 875-125 MG PO TABS: 1.0000 | ORAL_TABLET | Freq: Two times a day (BID) | ORAL | Status: DC

## 2014-03-08 MED ORDER — PREDNISONE 10 MG PO TABS: 30.0000 mg | ORAL_TABLET | Freq: Every day | ORAL | Status: DC

## 2014-03-08 MED ORDER — IPRATROPIUM BROMIDE 0.06 % NA SOLN: 2.0000 | Freq: Four times a day (QID) | NASAL | Status: DC

## 2014-03-08 NOTE — Discharge Instructions (Signed)
Thank you for coming in today. Use Atrovent nasal spray and prednisone as directed. Use Augmentin antibiotics if worsening or not improving Call or go to the emergency room if you get worse, have trouble breathing, have chest pains, or palpitations.   Sinusitis Sinusitis is redness, soreness, and swelling (inflammation) of the paranasal sinuses. Paranasal sinuses are air pockets within the bones of your face (beneath the eyes, the middle of the forehead, or above the eyes). In healthy paranasal sinuses, mucus is able to drain out, and air is able to circulate through them by way of your nose. However, when your paranasal sinuses are inflamed, mucus and air can become trapped. This can allow bacteria and other germs to grow and cause infection. Sinusitis can develop quickly and last only a short time (acute) or continue over a long period (chronic). Sinusitis that lasts for more than 12 weeks is considered chronic.  CAUSES  Causes of sinusitis include:  Allergies.  Structural abnormalities, such as displacement of the cartilage that separates your nostrils (deviated septum), which can decrease the air flow through your nose and sinuses and affect sinus drainage.  Functional abnormalities, such as when the small hairs (cilia) that line your sinuses and help remove mucus do not work properly or are not present. SYMPTOMS  Symptoms of acute and chronic sinusitis are the same. The primary symptoms are pain and pressure around the affected sinuses. Other symptoms include:  Upper toothache.  Earache.  Headache.  Bad breath.  Decreased sense of smell and taste.  A cough, which worsens when you are lying flat.  Fatigue.  Fever.  Thick drainage from your nose, which often is green and may contain pus (purulent).  Swelling and warmth over the affected sinuses. DIAGNOSIS  Your caregiver will perform a physical exam. During the exam, your caregiver may:  Look in your nose for signs of  abnormal growths in your nostrils (nasal polyps).  Tap over the affected sinus to check for signs of infection.  View the inside of your sinuses (endoscopy) with a special imaging device with a light attached (endoscope), which is inserted into your sinuses. If your caregiver suspects that you have chronic sinusitis, one or more of the following tests may be recommended:  Allergy tests.  Nasal culture--A sample of mucus is taken from your nose and sent to a lab and screened for bacteria.  Nasal cytology--A sample of mucus is taken from your nose and examined by your caregiver to determine if your sinusitis is related to an allergy. TREATMENT  Most cases of acute sinusitis are related to a viral infection and will resolve on their own within 10 days. Sometimes medicines are prescribed to help relieve symptoms (pain medicine, decongestants, nasal steroid sprays, or saline sprays).  However, for sinusitis related to a bacterial infection, your caregiver will prescribe antibiotic medicines. These are medicines that will help kill the bacteria causing the infection.  Rarely, sinusitis is caused by a fungal infection. In theses cases, your caregiver will prescribe antifungal medicine. For some cases of chronic sinusitis, surgery is needed. Generally, these are cases in which sinusitis recurs more than 3 times per year, despite other treatments. HOME CARE INSTRUCTIONS   Drink plenty of water. Water helps thin the mucus so your sinuses can drain more easily.  Use a humidifier.  Inhale steam 3 to 4 times a day (for example, sit in the bathroom with the shower running).  Apply a warm, moist washcloth to your face 3 to 4  times a day, or as directed by your caregiver.  Use saline nasal sprays to help moisten and clean your sinuses.  Take over-the-counter or prescription medicines for pain, discomfort, or fever only as directed by your caregiver. SEEK IMMEDIATE MEDICAL CARE IF:  You have  increasing pain or severe headaches.  You have nausea, vomiting, or drowsiness.  You have swelling around your face.  You have vision problems.  You have a stiff neck.  You have difficulty breathing. MAKE SURE YOU:   Understand these instructions.  Will watch your condition.  Will get help right away if you are not doing well or get worse. Document Released: 09/01/2005 Document Revised: 11/24/2011 Document Reviewed: 09/16/2011 [facility] Patient Information 2015 [place], [place]. This information is not intended to replace advice given to you by your health care provider. Make sure you discuss any questions you have with your health care provider.

## 2014-03-08 NOTE — ED Notes (Signed)
C/o  Sore throat.  Post nasal drip.  Nasal stuffiness.  Denies fever , n/v/d.   Symptoms present since last Friday.   No relief with otc meds.
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
## 2014-03-16 ENCOUNTER — Encounter (HOSPITAL_COMMUNITY): Payer: Self-pay | Admitting: Emergency Medicine

## 2014-03-16 ENCOUNTER — Emergency Department (HOSPITAL_COMMUNITY)
Admission: EM | Admit: 2014-03-16 | Discharge: 2014-03-16 | Disposition: A | Discharge status: Home / Self Care | Payer: 59 | Source: Home / Self Care | Attending: Family Medicine | Admitting: Family Medicine

## 2014-03-16 DIAGNOSIS — J0300 Acute streptococcal tonsillitis, unspecified: Secondary | ICD-10-CM

## 2014-03-16 DIAGNOSIS — J020 Streptococcal pharyngitis: Secondary | ICD-10-CM

## 2014-03-16 LAB — POCT RAPID STREP A: Streptococcus, Group A Screen (Direct): POSITIVE — AB

## 2014-03-16 MED ORDER — HYDROCODONE-ACETAMINOPHEN 5-325 MG PO TABS: 2.0000 | ORAL_TABLET | ORAL | Status: DC | PRN

## 2014-03-16 MED ORDER — CLINDAMYCIN HCL 300 MG PO CAPS: ORAL_CAPSULE | ORAL | Status: DC

## 2014-03-16 MED ORDER — CEFTRIAXONE SODIUM 1 G IJ SOLR
INTRAMUSCULAR | Status: AC
  Filled 2014-03-16: qty 10

## 2014-03-16 MED ORDER — CEFTRIAXONE SODIUM 1 G IJ SOLR
1.0000 g | Freq: Once | INTRAMUSCULAR | Status: AC
  Administered 2014-03-16: 1 g via INTRAMUSCULAR

## 2014-03-16 NOTE — ED Notes (Signed)
Pt  Has  symptoms  Of  sorethroat   With  Body  Aches  Chills      -  Seen  Last  Week   For  Margaret Rojas  Has  Been taking      augmentin  With  No  releif
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
## 2014-03-16 NOTE — Discharge Instructions (Signed)
Pharyngitis Pharyngitis is redness, pain, and swelling (inflammation) of your pharynx.  CAUSES  Pharyngitis is usually caused by infection. Most of the time, these infections are from viruses (viral) and are part of a cold. However, sometimes pharyngitis is caused by bacteria (bacterial). Pharyngitis can also be caused by allergies. Viral pharyngitis may be spread from person to person by coughing, sneezing, and personal items or utensils (cups, forks, spoons, toothbrushes). Bacterial pharyngitis may be spread from person to person by more intimate contact, such as kissing.  SIGNS AND SYMPTOMS  Symptoms of pharyngitis include:   Sore throat.   Tiredness (fatigue).   Low-grade fever.   Headache.  Joint pain and muscle aches.  Skin rashes.  Swollen lymph nodes.  Plaque-like film on throat or tonsils (often seen with bacterial pharyngitis). DIAGNOSIS  Your health care provider will ask you questions about your illness and your symptoms. Your medical history, along with a physical exam, is often all that is needed to diagnose pharyngitis. Sometimes, a rapid strep test is done. Other lab tests may also be done, depending on the suspected cause.  TREATMENT  Viral pharyngitis will usually get better in 3-4 days without the use of medicine. Bacterial pharyngitis is treated with medicines that kill germs (antibiotics).  HOME CARE INSTRUCTIONS   Drink enough water and fluids to keep your urine clear or pale yellow.   Only take over-the-counter or prescription medicines as directed by your health care provider:   If you are prescribed antibiotics, make sure you finish them even if you start to feel better.   Do not take aspirin.   Get lots of rest.   Gargle with 8 oz of salt water ( tsp of salt per 1 qt of water) as often as every 1-2 hours to soothe your throat.   Throat lozenges (if you are not at risk for choking) or sprays may be used to soothe your throat. SEEK MEDICAL  CARE IF:   You have large, tender lumps in your neck.  You have a rash.  You cough up green, yellow-brown, or bloody spit. SEEK IMMEDIATE MEDICAL CARE IF:   Your neck becomes stiff.  You drool or are unable to swallow liquids.  You vomit or are unable to keep medicines or liquids down.  You have severe pain that does not go away with the use of recommended medicines.  You have trouble breathing (not caused by a stuffy nose). MAKE SURE YOU:   Understand these instructions.  Will watch your condition.  Will get help right away if you are not doing well or get worse. Document Released: 09/01/2005 Document Revised: 06/22/2013 Document Reviewed: 05/09/2013 [facility] Patient Information 2015 [place], [place]. This information is not intended to replace advice given to you by your health care provider. Make sure you discuss any questions you have with your health care provider. Strep Throat Strep throat is an infection of the throat caused by a bacteria named Streptococcus pyogenes. Your caregiver may call the infection streptococcal "tonsillitis" or "pharyngitis" depending on whether there are signs of inflammation in the tonsils or back of the throat. Strep throat is most common in children aged 5-15 years during the cold months of the year, but it can occur in people of any age during any season. This infection is spread from person to person (contagious) through coughing, sneezing, or other close contact. SYMPTOMS   Fever or chills.  Painful, swollen, red tonsils or throat.  Pain or difficulty when swallowing.  White or  yellow spots on the tonsils or throat.  Swollen, tender lymph nodes or "glands" of the neck or under the jaw.  Red rash all over the body (rare). DIAGNOSIS  Many different infections can cause the same symptoms. A test must be done to confirm the diagnosis so the right treatment can be given. A "rapid strep test" can help your caregiver make the diagnosis in a  few minutes. If this test is not available, a light swab of the infected area can be used for a throat culture test. If a throat culture test is done, results are usually available in a day or two. TREATMENT  Strep throat is treated with antibiotic medicine. HOME CARE INSTRUCTIONS   Gargle with 1 tsp of salt in 1 cup of warm water, 3-4 times per day or as needed for comfort.  Family members who also have a sore throat or fever should be tested for strep throat and treated with antibiotics if they have the strep infection.  Make sure everyone in your household washes their hands well.  Do not share food, drinking cups, or personal items that could cause the infection to spread to others.  You may need to eat a soft food diet until your sore throat gets better.  Drink enough water and fluids to keep your urine clear or pale yellow. This will help prevent dehydration.  Get plenty of rest.  Stay home from school, daycare, or work until you have been on antibiotics for 24 hours.  Only take over-the-counter or prescription medicines for pain, discomfort, or fever as directed by your caregiver.  If antibiotics are prescribed, take them as directed. Finish them even if you start to feel better. SEEK MEDICAL CARE IF:   The glands in your neck continue to enlarge.  You develop a rash, cough, or earache.  You cough up green, yellow-brown, or bloody sputum.  You have pain or discomfort not controlled by medicines.  Your problems seem to be getting worse rather than better. SEEK IMMEDIATE MEDICAL CARE IF:   You develop any new symptoms such as vomiting, severe headache, stiff or painful neck, chest pain, shortness of breath, or trouble swallowing.  You develop severe throat pain, drooling, or changes in your voice.  You develop swelling of the neck, or the skin on the neck becomes red and tender.  You have a fever.  You develop signs of dehydration, such as fatigue, dry mouth, and  decreased urination.  You become increasingly sleepy, or you cannot wake up completely. Document Released: 08/29/2000 Document Revised: 08/18/2012 Document Reviewed: 10/31/2010 [facility] Patient Information 2015 [place], [place]. This information is not intended to replace advice given to you by your health care provider. Make sure you discuss any questions you have with your health care provider.

## 2014-03-17 NOTE — ED Provider Notes (Signed)
Medical screening examination/treatment/procedure(s) were performed by a resident physician or non-physician practitioner and as the supervising physician I was immediately available for consultation/collaboration.  Lynne Leader, MD    Gregor Hams, MD 03/17/14 [phone]

## 2014-06-21 ENCOUNTER — Ambulatory Visit (HOSPITAL_COMMUNITY): Payer: 59 | Attending: Emergency Medicine

## 2014-06-21 ENCOUNTER — Emergency Department (HOSPITAL_COMMUNITY)
Admission: EM | Admit: 2014-06-21 | Discharge: 2014-06-21 | Disposition: A | Discharge status: Home / Self Care | Payer: 59 | Source: Home / Self Care | Attending: Family Medicine | Admitting: Family Medicine

## 2014-06-21 ENCOUNTER — Encounter (HOSPITAL_COMMUNITY): Payer: Self-pay | Admitting: Emergency Medicine

## 2014-06-21 DIAGNOSIS — R110 Nausea: Secondary | ICD-10-CM | POA: Diagnosis not present

## 2014-06-21 DIAGNOSIS — R1030 Lower abdominal pain, unspecified: Secondary | ICD-10-CM | POA: Diagnosis present

## 2014-06-21 DIAGNOSIS — M4186 Other forms of scoliosis, lumbar region: Secondary | ICD-10-CM | POA: Insufficient documentation

## 2014-06-21 DIAGNOSIS — K529 Noninfective gastroenteritis and colitis, unspecified: Secondary | ICD-10-CM

## 2014-06-21 LAB — POCT URINALYSIS DIP (DEVICE)
Bilirubin Urine: NEGATIVE
GLUCOSE, UA: NEGATIVE mg/dL
Ketones, ur: NEGATIVE mg/dL
LEUKOCYTES UA: NEGATIVE
Nitrite: NEGATIVE
PROTEIN: NEGATIVE mg/dL
Specific Gravity, Urine: 1.02 (ref 1.005–1.030)
UROBILINOGEN UA: 0.2 mg/dL (ref 0.0–1.0)
pH: 6 (ref 5.0–8.0)

## 2014-06-21 LAB — POCT PREGNANCY, URINE: PREG TEST UR: NEGATIVE

## 2014-06-21 MED ORDER — ONDANSETRON HCL 4 MG PO TABS: 4.0000 mg | ORAL_TABLET | Freq: Three times a day (TID) | ORAL | Status: DC | PRN

## 2014-06-21 NOTE — ED Notes (Addendum)
C/o lower abdominal pain since Sunday PM,  As "intense as labor pain" . Denies n/v/d. LMP  2 weeks ago, BCP for contract

## 2014-06-21 NOTE — Discharge Instructions (Signed)

## 2014-06-21 NOTE — ED Provider Notes (Signed)
Medical screening examination/treatment/procedure(s) were performed by a resident physician or non-physician practitioner and as the supervising physician I was immediately available for consultation/collaboration.  Linna Darner, MD Family Medicine   Waldemar Dickens, MD 06/21/14 [phone]
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
## 2014-07-05 ENCOUNTER — Encounter (HOSPITAL_COMMUNITY): Payer: Self-pay | Admitting: Emergency Medicine

## 2014-07-05 ENCOUNTER — Emergency Department (HOSPITAL_COMMUNITY)
Admission: EM | Admit: 2014-07-05 | Discharge: 2014-07-05 | Disposition: A | Discharge status: Home / Self Care | Payer: 59 | Source: Home / Self Care | Attending: Emergency Medicine | Admitting: Emergency Medicine

## 2014-07-05 DIAGNOSIS — J069 Acute upper respiratory infection, unspecified: Secondary | ICD-10-CM

## 2014-07-05 MED ORDER — PSEUDOEPHEDRINE HCL 60 MG PO TABS: 60.0000 mg | ORAL_TABLET | Freq: Every day | ORAL | Status: DC

## 2014-07-05 MED ORDER — IPRATROPIUM BROMIDE 0.06 % NA SOLN: 2.0000 | Freq: Four times a day (QID) | NASAL | Status: DC

## 2014-07-05 NOTE — ED Notes (Signed)
Pt  Reports  Symptoms of  Sinus  Pressure  /drainage      With  A   Cough    With  symptoms for   Several weeks    Pt  Reports  Taking  Otc   meds        For  The  Symptoms    -  She  Is sitting upright on the  Exam table speaking in   Complete  Sentances

## 2014-07-05 NOTE — Discharge Instructions (Signed)
Upper Respiratory Infection, Adult An upper respiratory infection (URI) is also sometimes known as the common cold. The upper respiratory tract includes the nose, sinuses, throat, trachea, and bronchi. Bronchi are the airways leading to the lungs. Most people improve within 1 week, but symptoms can last up to 2 weeks. A residual cough may last even longer.  CAUSES Many different viruses can infect the tissues lining the upper respiratory tract. The tissues become irritated and inflamed and often become very moist. Mucus production is also common. A cold is contagious. You can easily spread the virus to others by oral contact. This includes kissing, sharing a glass, coughing, or sneezing. Touching your mouth or nose and then touching a surface, which is then touched by another person, can also spread the virus. SYMPTOMS  Symptoms typically develop 1 to 3 days after you come in contact with a cold virus. Symptoms vary from person to person. They may include:  Runny nose.  Sneezing.  Nasal congestion.  Sinus irritation.  Sore throat.  Loss of voice (laryngitis).  Cough.  Fatigue.  Muscle aches.  Loss of appetite.  Headache.  Low-grade fever. DIAGNOSIS  You might diagnose your own cold based on familiar symptoms, since most people get a cold 2 to 3 times a year. Your caregiver can confirm this based on your exam. Most importantly, your caregiver can check that your symptoms are not due to another disease such as strep throat, sinusitis, pneumonia, asthma, or epiglottitis. Blood tests, throat tests, and X-rays are not necessary to diagnose a common cold, but they may sometimes be helpful in excluding other more serious diseases. Your caregiver will decide if any further tests are required. RISKS AND COMPLICATIONS  You may be at risk for a more severe case of the common cold if you smoke cigarettes, have chronic heart disease (such as heart failure) or lung disease (such as asthma), or if  you have a weakened immune system. The very young and very old are also at risk for more serious infections. Bacterial sinusitis, middle ear infections, and bacterial pneumonia can complicate the common cold. The common cold can worsen asthma and chronic obstructive pulmonary disease (COPD). Sometimes, these complications can require emergency medical care and may be life-threatening. PREVENTION  The best way to protect against getting a cold is to practice good hygiene. Avoid oral or hand contact with people with cold symptoms. Wash your hands often if contact occurs. There is no clear evidence that vitamin C, vitamin E, echinacea, or exercise reduces the chance of developing a cold. However, it is always recommended to get plenty of rest and practice good nutrition. TREATMENT  Treatment is directed at relieving symptoms. There is no cure. Antibiotics are not effective, because the infection is caused by a virus, not by bacteria. Treatment may include:  Increased fluid intake. Sports drinks offer valuable electrolytes, sugars, and fluids.  Breathing heated mist or steam (vaporizer or shower).  Eating chicken soup or other clear broths, and maintaining good nutrition.  Getting plenty of rest.  Using gargles or lozenges for comfort.  Controlling fevers with ibuprofen or acetaminophen as directed by your caregiver.  Increasing usage of your inhaler if you have asthma. Zinc gel and zinc lozenges, taken in the first 24 hours of the common cold, can shorten the duration and lessen the severity of symptoms. Pain medicines may help with fever, muscle aches, and throat pain. A variety of non-prescription medicines are available to treat congestion and runny nose. Your caregiver   can make recommendations and may suggest nasal or lung inhalers for other symptoms.  HOME CARE INSTRUCTIONS   Only take over-the-counter or prescription medicines for pain, discomfort, or fever as directed by your  caregiver.  Use a warm mist humidifier or inhale steam from a shower to increase air moisture. This may keep secretions moist and make it easier to breathe.  Drink enough water and fluids to keep your urine clear or pale yellow.  Rest as needed.  Return to work when your temperature has returned to normal or as your caregiver advises. You may need to stay home longer to avoid infecting others. You can also use a face mask and careful hand washing to prevent spread of the virus. SEEK MEDICAL CARE IF:   After the first few days, you feel you are getting worse rather than better.  You need your caregiver's advice about medicines to control symptoms.  You develop chills, worsening shortness of breath, or brown or red sputum. These may be signs of pneumonia.  You develop yellow or brown nasal discharge or pain in the face, especially when you bend forward. These may be signs of sinusitis.  You develop a fever, swollen neck glands, pain with swallowing, or white areas in the back of your throat. These may be signs of strep throat. SEEK IMMEDIATE MEDICAL CARE IF:   You have a fever.  You develop severe or persistent headache, ear pain, sinus pain, or chest pain.  You develop wheezing, a prolonged cough, cough up blood, or have a change in your usual mucus (if you have chronic lung disease).  You develop sore muscles or a stiff neck. Document Released: 02/25/2001 Document Revised: 11/24/2011 Document Reviewed: 12/07/2013 Exit[employer] Patient Information 2015 [employer]. This information is not intended to replace advice given to you by your health care provider. Make sure you discuss any questions you have with your health care provider.  
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
## 2014-07-05 NOTE — ED Provider Notes (Signed)
Medical screening examination/treatment/procedure(s) were performed by resident physician or non-physician practitioner and as supervising physician I was immediately available for consultation/collaboration.  Maryruth Eve, MD     Melony Overly, MD 07/05/14 [phone]

## 2014-07-13 ENCOUNTER — Other Ambulatory Visit: Payer: Self-pay | Admitting: *Deleted

## 2014-07-13 MED ORDER — ZOLPIDEM TARTRATE 5 MG PO TABS: 5.0000 mg | ORAL_TABLET | Freq: Every evening | ORAL | Status: DC | PRN

## 2014-07-13 NOTE — Telephone Encounter (Signed)
Px written for call in   

## 2014-07-13 NOTE — Telephone Encounter (Signed)
Rx called in as prescribed 

## 2014-07-25 ENCOUNTER — Telehealth: Payer: Self-pay | Admitting: *Deleted

## 2014-07-25 NOTE — Telephone Encounter (Signed)
Done and in IN box 

## 2014-07-25 NOTE — Telephone Encounter (Signed)
PA faxed

## 2014-07-25 NOTE — Telephone Encounter (Signed)
Received prior auth request for zolpidem, forms placed in your inbox.

## 2014-08-09 NOTE — Telephone Encounter (Signed)
Additional info form received and placed in your inbox

## 2014-12-01 ENCOUNTER — Encounter: Payer: Self-pay | Admitting: Family Medicine

## 2014-12-01 ENCOUNTER — Ambulatory Visit (INDEPENDENT_AMBULATORY_CARE_PROVIDER_SITE_OTHER): Payer: 59 | Admitting: Family Medicine

## 2014-12-01 VITALS — BP 114/76 | HR 88 | Temp 98.3°F | Wt 183.0 lb

## 2014-12-01 DIAGNOSIS — L709 Acne, unspecified: Secondary | ICD-10-CM

## 2014-12-01 DIAGNOSIS — F4323 Adjustment disorder with mixed anxiety and depressed mood: Secondary | ICD-10-CM

## 2014-12-01 MED ORDER — ZOLPIDEM TARTRATE 10 MG PO TABS: 10.0000 mg | ORAL_TABLET | Freq: Every evening | ORAL | Status: DC | PRN

## 2014-12-01 MED ORDER — SERTRALINE HCL 50 MG PO TABS: 50.0000 mg | ORAL_TABLET | Freq: Every day | ORAL | Status: DC

## 2014-12-01 MED ORDER — ADAPALENE-BENZOYL PEROXIDE 0.1-2.5 % EX GEL: CUTANEOUS | Status: DC

## 2014-12-01 MED ORDER — ALPRAZOLAM 0.5 MG PO TABS: 0.5000 mg | ORAL_TABLET | Freq: Every day | ORAL | Status: DC | PRN

## 2014-12-01 NOTE — Progress Notes (Signed)
Pre visit review using our clinic review tool, if applicable. No additional management support is needed unless otherwise documented below in the visit note. 

## 2014-12-01 NOTE — Patient Instructions (Signed)
Stop at check out for referral to counseling Keep exercising and taking care of yourself  Get back on zoloft -start with 25 and then increase to 50 after a week if doing well  Follow up in about 3 months
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
## 2014-12-03 NOTE — Assessment & Plan Note (Signed)
epiduo works well  This was refilled

## 2014-12-03 NOTE — Assessment & Plan Note (Signed)
Symptoms have worsened Reviewed stressors/ coping techniques/symptoms/ support sources/ tx options and side effects in detail today  Will re start zoloft which she tolerates well  Titrate 25 to 50  F/u planned  Xanax for prn use (does not use often) with warnings of sedation and habit  Self care/exercise Referred to counseling  >25 minutes spent in face to face time with patient, >50% spent in counselling or coordination of care

## 2014-12-04 ENCOUNTER — Telehealth: Payer: Self-pay | Admitting: Family Medicine

## 2014-12-04 NOTE — Telephone Encounter (Signed)
emmi emailed °

## 2014-12-13 ENCOUNTER — Ambulatory Visit: Payer: Self-pay | Admitting: Podiatry

## 2015-01-01 ENCOUNTER — Ambulatory Visit: Payer: 59 | Admitting: Licensed Clinical Social Worker

## 2015-05-30 ENCOUNTER — Other Ambulatory Visit: Payer: Self-pay | Admitting: Family Medicine

## 2015-05-31 NOTE — Telephone Encounter (Signed)
Electronic refill request, last OV 12/01/14, last refilled on 12/01/14 #30 with 0 refills, please advise

## 2015-05-31 NOTE — Telephone Encounter (Signed)
Px written for call in   

## 2015-05-31 NOTE — Telephone Encounter (Signed)
Rx called in as prescribed 

## 2015-10-12 ENCOUNTER — Other Ambulatory Visit: Payer: Self-pay | Admitting: Family Medicine

## 2015-10-15 NOTE — Telephone Encounter (Signed)
Rx called in as prescribed 

## 2015-10-15 NOTE — Telephone Encounter (Signed)
Electronic refill request, med not on med list and last refilled on 12/01/14, and that was her last appt date too and no future appt., please advise

## 2015-10-15 NOTE — Telephone Encounter (Signed)
Px written for call in   

## 2015-12-16 ENCOUNTER — Other Ambulatory Visit: Payer: Self-pay | Admitting: Family Medicine

## 2015-12-17 NOTE — Telephone Encounter (Signed)
Pt hasn't been seen in over a year and no future appt. scheduled please advise

## 2015-12-17 NOTE — Telephone Encounter (Signed)
appt scheduled and med refilled 

## 2015-12-17 NOTE — Telephone Encounter (Signed)
Please schedule a summer f/u and refill unil then

## 2016-02-04 ENCOUNTER — Other Ambulatory Visit: Payer: Self-pay

## 2016-02-04 DIAGNOSIS — Z1231 Encounter for screening mammogram for malignant neoplasm of breast: Secondary | ICD-10-CM

## 2016-03-06 ENCOUNTER — Ambulatory Visit
Admission: RE | Admit: 2016-03-06 | Discharge: 2016-03-06 | Disposition: A | Discharge status: Home / Self Care | Payer: 59 | Source: Ambulatory Visit

## 2016-03-06 DIAGNOSIS — Z1231 Encounter for screening mammogram for malignant neoplasm of breast: Secondary | ICD-10-CM

## 2016-03-10 ENCOUNTER — Other Ambulatory Visit: Payer: Self-pay | Admitting: Obstetrics and Gynecology

## 2016-03-10 DIAGNOSIS — R928 Other abnormal and inconclusive findings on diagnostic imaging of breast: Secondary | ICD-10-CM

## 2016-03-17 ENCOUNTER — Ambulatory Visit
Admission: RE | Admit: 2016-03-17 | Discharge: 2016-03-17 | Disposition: A | Discharge status: Home / Self Care | Payer: 59 | Source: Ambulatory Visit | Attending: Obstetrics and Gynecology | Admitting: Obstetrics and Gynecology

## 2016-03-17 DIAGNOSIS — R928 Other abnormal and inconclusive findings on diagnostic imaging of breast: Secondary | ICD-10-CM

## 2016-04-16 ENCOUNTER — Ambulatory Visit: Payer: 59 | Admitting: Family Medicine

## 2016-04-21 ENCOUNTER — Encounter: Payer: Self-pay | Admitting: Family Medicine

## 2016-04-21 ENCOUNTER — Ambulatory Visit (INDEPENDENT_AMBULATORY_CARE_PROVIDER_SITE_OTHER): Payer: 59 | Admitting: Family Medicine

## 2016-04-21 VITALS — BP 128/64 | HR 62 | Temp 98.1°F | Ht 68.5 in | Wt 190.5 lb

## 2016-04-21 DIAGNOSIS — F4323 Adjustment disorder with mixed anxiety and depressed mood: Secondary | ICD-10-CM | POA: Diagnosis not present

## 2016-04-21 DIAGNOSIS — L709 Acne, unspecified: Secondary | ICD-10-CM | POA: Diagnosis not present

## 2016-04-21 DIAGNOSIS — G4700 Insomnia, unspecified: Secondary | ICD-10-CM | POA: Diagnosis not present

## 2016-04-21 MED ORDER — ZOLPIDEM TARTRATE 10 MG PO TABS: 10.0000 mg | ORAL_TABLET | Freq: Every evening | ORAL | 3 refills | Status: DC | PRN

## 2016-04-21 MED ORDER — ADAPALENE-BENZOYL PEROXIDE 0.1-2.5 % EX GEL: CUTANEOUS | 3 refills | Status: DC

## 2016-04-21 MED ORDER — ALPRAZOLAM 0.5 MG PO TABS: 0.5000 mg | ORAL_TABLET | Freq: Every day | ORAL | 0 refills | Status: DC | PRN

## 2016-04-21 MED ORDER — SERTRALINE HCL 50 MG PO TABS: 75.0000 mg | ORAL_TABLET | Freq: Every day | ORAL | 11 refills | Status: DC

## 2016-04-21 NOTE — Assessment & Plan Note (Signed)
Refilled epiduo which works very well with her Disc sun protection

## 2016-04-21 NOTE — Progress Notes (Signed)
Pre visit review using our clinic review tool, if applicable. No additional management support is needed unless otherwise documented below in the visit note. 

## 2016-04-21 NOTE — Assessment & Plan Note (Signed)
Reviewed stressors/ coping techniques/symptoms/ support sources/ tx options and side effects in detail today  Overall better with zoloft but desires a dose inc for anxious symptoms and insomnia Inc to 75 mg  Discussed expectations of SSRI medication including time to effectiveness and mechanism of action, also poss of side effects (early and late)- including mental fuzziness, weight or appetite change, nausea and poss of worse dep or anxiety (even suicidal thoughts)  Pt voiced understanding and will stop med and update if this occurs   Refilled xanax for prn use (she uses less than 30 a year) ambien for prn use -does not use all the time Disc investigating meditation to help mood and sleep/ resources suggested  Also enc good health habits

## 2016-04-21 NOTE — Patient Instructions (Signed)
Do get to counseling when you can  Exercise as tolerated  The author I like to read about meditation and mindfulness -- is [name] Mult[employer] programmer  Increase zoloft to 75 mg daily  Let me know in the next 1-2 months if no improvement (or if problems)
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
## 2016-04-21 NOTE — Assessment & Plan Note (Signed)
Chronic and multifactorial Will continue to work on stressors and mood Enc exercise and caffeine avoidance and sleep hygiene  Refilled ambien for prn use

## 2016-04-22 ENCOUNTER — Telehealth: Payer: Self-pay | Admitting: *Deleted

## 2016-04-22 NOTE — Telephone Encounter (Signed)
Received a fax from the [facility] pharmacy on file saying that the Epiduo isn't covered but the Rx Differin is, pharmacy is requesting you send in a new Rx for the Differin

## 2016-04-22 NOTE — Telephone Encounter (Signed)
Pt will call pharmacy and see how much med is out of pocket if it's to expensive then she will call us back to prescribe the Differin

## 2016-04-22 NOTE — Telephone Encounter (Signed)
Ask patient first - she really liked the epiduo and may be willing to pay for it- it not, I can change to Differin

## 2016-05-08 ENCOUNTER — Other Ambulatory Visit: Payer: Self-pay | Admitting: Family Medicine

## 2016-09-15 DIAGNOSIS — Z923 Personal history of irradiation: Secondary | ICD-10-CM

## 2016-09-15 HISTORY — DX: Personal history of irradiation: Z92.3

## 2016-09-15 HISTORY — PX: SPINAL FUSION: SHX223

## 2016-09-15 HISTORY — PX: FOOT SURGERY: SHX648

## 2016-10-07 ENCOUNTER — Telehealth (INDEPENDENT_AMBULATORY_CARE_PROVIDER_SITE_OTHER): Payer: Self-pay | Admitting: Physical Medicine and Rehabilitation

## 2016-10-07 ENCOUNTER — Ambulatory Visit (INDEPENDENT_AMBULATORY_CARE_PROVIDER_SITE_OTHER): Payer: 59 | Admitting: Physical Medicine and Rehabilitation

## 2016-10-07 ENCOUNTER — Encounter (INDEPENDENT_AMBULATORY_CARE_PROVIDER_SITE_OTHER): Payer: Self-pay | Admitting: Physical Medicine and Rehabilitation

## 2016-10-07 VITALS — BP 112/67 | HR 92

## 2016-10-07 DIAGNOSIS — G8929 Other chronic pain: Secondary | ICD-10-CM

## 2016-10-07 DIAGNOSIS — M5116 Intervertebral disc disorders with radiculopathy, lumbar region: Secondary | ICD-10-CM | POA: Diagnosis not present

## 2016-10-07 DIAGNOSIS — M5416 Radiculopathy, lumbar region: Secondary | ICD-10-CM

## 2016-10-07 DIAGNOSIS — M545 Low back pain, unspecified: Secondary | ICD-10-CM
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
## 2016-10-09 ENCOUNTER — Ambulatory Visit (INDEPENDENT_AMBULATORY_CARE_PROVIDER_SITE_OTHER): Payer: 59 | Admitting: Physical Medicine and Rehabilitation

## 2016-10-09 ENCOUNTER — Ambulatory Visit (INDEPENDENT_AMBULATORY_CARE_PROVIDER_SITE_OTHER): Payer: Self-pay

## 2016-10-09 ENCOUNTER — Encounter (INDEPENDENT_AMBULATORY_CARE_PROVIDER_SITE_OTHER): Payer: Self-pay | Admitting: Physical Medicine and Rehabilitation

## 2016-10-09 VITALS — BP 116/68 | HR 74

## 2016-10-09 DIAGNOSIS — M5416 Radiculopathy, lumbar region: Secondary | ICD-10-CM | POA: Diagnosis not present

## 2016-10-09 MED ORDER — METHYLPREDNISOLONE ACETATE 80 MG/ML IJ SUSP
80.0000 mg | Freq: Once | INTRAMUSCULAR | Status: AC
  Administered 2016-10-09: 80 mg

## 2016-10-09 MED ORDER — LIDOCAINE HCL (PF) 1 % IJ SOLN
0.3300 mL | Freq: Once | INTRAMUSCULAR | Status: DC
Start: 2016-10-09 — End: 2016-11-20

## 2016-10-09 NOTE — Patient Instructions (Signed)

## 2016-10-09 NOTE — Procedures (Signed)
S1 Lumbosacral Transforaminal Epidural Steroid Injection - Sub-Pedicular Approach with Fluoroscopic Guidance   Patient: Margaret Rojas      Date of Birth: 1976/09/15 MRN: [id number] PCP: Loura Pardon, MD      Visit Date: 10/09/2016   Universal Protocol:    Date/Time: 01/25/183:21 PM  Consent Given By: the patient  Position:  PRONE  Additional Comments: Vital signs were monitored before and after the procedure. Patient was prepped and draped in the usual sterile fashion. The correct patient, procedure, and site was verified.   Injection Procedure Details:  Procedure Site One Meds Administered:  Meds ordered this encounter  Medications  . lidocaine (PF) (XYLOCAINE) 1 % injection 0.3 mL  . methylPREDNISolone acetate (DEPO-MEDROL) injection 80 mg    Laterality: Left  Location/Site:  S1 Foramen   Needle size: 22 G  Needle type: Spinal  Needle Placement: Transforaminal  Findings:  -Contrast Used: 1 mL iohexol 180 mg iodine/mL   -Comments: Excellent flow of contrast along the nerve and into the epidural space.  Procedure Details: After squaring off the sacral end-plate to get a true AP view, the C-arm was positioned so that the best possible view of the S1 foramen was visualized. The soft tissues overlying this structure were infiltrated with 2-3 ml. of 1% Lidocaine without Epinephrine.    The spinal needle was inserted toward the target using a "trajectory" view along the fluoroscope beam.  Under AP and lateral visualization, the needle was advanced so it did not puncture dura. Biplanar projections were used to confirm position. Aspiration was confirmed to be negative for CSF and/or blood. A 1-2 ml. volume of Isovue-250 was injected and flow of contrast was noted at each level. Radiographs were obtained for documentation purposes.   After attaining the desired flow of contrast documented above, a 0.5 to 1.0 ml test dose of 0.25% Marcaine was injected into each  respective transforaminal space.  The patient was observed for 90 seconds post injection.  After no sensory deficits were reported, and normal lower extremity motor function was noted,   the above injectate was administered so that equal amounts of the injectate were placed at each foramen (level) into the transforaminal epidural space.   Additional Comments:  The patient tolerated the procedure well Dressing: Band-Aid and 2x2 sterile gauze     Post-procedure details: Patient was observed during the procedure. Post-procedure instructions were reviewed.  Patient left the clinic in stable condition.
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
## 2016-10-09 NOTE — Telephone Encounter (Signed)
No prior auth required

## 2016-10-14 ENCOUNTER — Telehealth: Payer: Self-pay

## 2016-10-14 MED ORDER — OSELTAMIVIR PHOSPHATE 75 MG PO CAPS: 75.0000 mg | ORAL_CAPSULE | Freq: Every day | ORAL | 0 refills | Status: DC

## 2016-10-14 NOTE — Telephone Encounter (Signed)
Pt left /vm; pt's husband has been diagnosed with the flu; pt and husband are to leave on 10/18/16 on a cruise and pt request preventative tamiflu to [facility] [place]. Pt is presently taking an abx due to cold symptoms and ear infection. Pt request cb.

## 2016-10-14 NOTE — Telephone Encounter (Signed)
I sent it to her pharmacy

## 2016-10-15 NOTE — Telephone Encounter (Signed)
Left voicemail letting pt know Rx sent 

## 2016-10-16 ENCOUNTER — Telehealth (INDEPENDENT_AMBULATORY_CARE_PROVIDER_SITE_OTHER): Payer: Self-pay

## 2016-10-16 DIAGNOSIS — M62838 Other muscle spasm: Secondary | ICD-10-CM

## 2016-10-16 DIAGNOSIS — M545 Low back pain, unspecified: Secondary | ICD-10-CM

## 2016-10-16 DIAGNOSIS — G8929 Other chronic pain: Secondary | ICD-10-CM

## 2016-10-16 MED ORDER — TIZANIDINE HCL 2 MG PO TABS: ORAL_TABLET | ORAL | 0 refills | Status: DC

## 2016-10-16 MED ORDER — TRAMADOL HCL 50 MG PO TABS: 50.0000 mg | ORAL_TABLET | Freq: Three times a day (TID) | ORAL | 0 refills | Status: DC | PRN

## 2016-10-16 NOTE — Telephone Encounter (Signed)
Faxed in Tramadol to [facility] [place] [phone]

## 2016-10-16 NOTE — Telephone Encounter (Signed)
We could try tramadol and muscle relaxer, ask her about that and respond back to me and I can write RX

## 2016-10-16 NOTE — Addendum Note (Signed)
Addended by: Raymondo Band on: 10/16/2016 11:32 AM   Modules accepted: Orders

## 2016-10-16 NOTE — Telephone Encounter (Signed)
Pt left voicemail...had injection on 10/09/16 (left S1 Tf) and still hasn't had a lot of relief. Knows you said it could take up to 2 weeks but she is leaving for vacation on Sunday and will be riding in the car for several hours and says this bothers her a lot and wants to know if there is something she can take "to get her over the hump."

## 2016-10-28 ENCOUNTER — Telehealth (INDEPENDENT_AMBULATORY_CARE_PROVIDER_SITE_OTHER): Payer: Self-pay | Admitting: Physical Medicine and Rehabilitation

## 2016-10-28 DIAGNOSIS — M5416 Radiculopathy, lumbar region: Secondary | ICD-10-CM

## 2016-10-28 NOTE — Telephone Encounter (Signed)
Can you ask where she prefers MRI and if claustrophobic etc, you can order Lspine MRI without contrast with diagnosis of lumbar radiculopathy and I will co-sign

## 2016-10-28 NOTE — Telephone Encounter (Signed)
Patient contacted and order placed.

## 2016-11-02 ENCOUNTER — Ambulatory Visit
Admission: RE | Admit: 2016-11-02 | Discharge: 2016-11-02 | Disposition: A | Discharge status: Home / Self Care | Payer: 59 | Source: Ambulatory Visit | Attending: Physical Medicine and Rehabilitation | Admitting: Physical Medicine and Rehabilitation

## 2016-11-02 DIAGNOSIS — M5416 Radiculopathy, lumbar region: Secondary | ICD-10-CM

## 2016-11-06 ENCOUNTER — Ambulatory Visit (INDEPENDENT_AMBULATORY_CARE_PROVIDER_SITE_OTHER): Payer: 59 | Admitting: Physical Medicine and Rehabilitation

## 2016-11-06 ENCOUNTER — Encounter (INDEPENDENT_AMBULATORY_CARE_PROVIDER_SITE_OTHER): Payer: Self-pay | Admitting: Physical Medicine and Rehabilitation

## 2016-11-06 VITALS — BP 118/77 | HR 79

## 2016-11-06 DIAGNOSIS — M5136 Other intervertebral disc degeneration, lumbar region: Secondary | ICD-10-CM | POA: Diagnosis not present

## 2016-11-06 DIAGNOSIS — M4316 Spondylolisthesis, lumbar region: Secondary | ICD-10-CM

## 2016-11-06 DIAGNOSIS — M545 Low back pain: Secondary | ICD-10-CM | POA: Diagnosis not present

## 2016-11-06 DIAGNOSIS — G8929 Other chronic pain: Secondary | ICD-10-CM | POA: Diagnosis not present

## 2016-11-06 DIAGNOSIS — M5116 Intervertebral disc disorders with radiculopathy, lumbar region: Secondary | ICD-10-CM | POA: Diagnosis not present
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
## 2016-11-12 ENCOUNTER — Ambulatory Visit (INDEPENDENT_AMBULATORY_CARE_PROVIDER_SITE_OTHER): Payer: 59 | Admitting: Physical Medicine and Rehabilitation

## 2016-11-13 ENCOUNTER — Ambulatory Visit (INDEPENDENT_AMBULATORY_CARE_PROVIDER_SITE_OTHER): Payer: 59 | Admitting: Physical Medicine and Rehabilitation

## 2016-11-14 ENCOUNTER — Telehealth (INDEPENDENT_AMBULATORY_CARE_PROVIDER_SITE_OTHER): Payer: Self-pay

## 2016-11-14 DIAGNOSIS — M5136 Other intervertebral disc degeneration, lumbar region: Secondary | ICD-10-CM

## 2016-11-14 DIAGNOSIS — M5416 Radiculopathy, lumbar region: Secondary | ICD-10-CM

## 2016-11-14 NOTE — Telephone Encounter (Addendum)
Patient left message requesting a referral to Dr. Saintclair Halsted. Has an appt with you next week but she wanted to go ahead and see if this can be done since it might take awhile to get scheduled with him.

## 2016-11-17 NOTE — Telephone Encounter (Signed)
I did put a referral into for Dr. Saintclair Halsted.

## 2016-11-17 NOTE — Telephone Encounter (Signed)
Spoke with pt and adv notes and referral form sent to [facility] and they will be contacting her for scheduling

## 2016-11-17 NOTE — Addendum Note (Signed)
Addended by: Raymondo Band on: 11/17/2016 12:05 PM   Modules accepted: Orders

## 2016-11-20 ENCOUNTER — Encounter (INDEPENDENT_AMBULATORY_CARE_PROVIDER_SITE_OTHER): Payer: Self-pay | Admitting: Physical Medicine and Rehabilitation

## 2016-11-20 ENCOUNTER — Ambulatory Visit (INDEPENDENT_AMBULATORY_CARE_PROVIDER_SITE_OTHER): Payer: 59 | Admitting: Physical Medicine and Rehabilitation

## 2016-11-20 ENCOUNTER — Ambulatory Visit (INDEPENDENT_AMBULATORY_CARE_PROVIDER_SITE_OTHER): Payer: Self-pay

## 2016-11-20 VITALS — BP 116/70 | HR 62

## 2016-11-20 DIAGNOSIS — M545 Low back pain, unspecified: Secondary | ICD-10-CM

## 2016-11-20 DIAGNOSIS — M5416 Radiculopathy, lumbar region: Secondary | ICD-10-CM | POA: Diagnosis not present

## 2016-11-20 DIAGNOSIS — M5136 Other intervertebral disc degeneration, lumbar region: Secondary | ICD-10-CM | POA: Diagnosis not present

## 2016-11-20 DIAGNOSIS — M51369 Other intervertebral disc degeneration, lumbar region without mention of lumbar back pain or lower extremity pain: Secondary | ICD-10-CM | POA: Insufficient documentation

## 2016-11-20 DIAGNOSIS — G8929 Other chronic pain: Secondary | ICD-10-CM

## 2016-11-20 MED ORDER — TRAMADOL HCL 50 MG PO TABS: 50.0000 mg | ORAL_TABLET | Freq: Three times a day (TID) | ORAL | 0 refills | Status: DC | PRN

## 2016-11-20 MED ORDER — METHYLPREDNISOLONE ACETATE 80 MG/ML IJ SUSP
80.0000 mg | Freq: Once | INTRAMUSCULAR | Status: AC
  Administered 2016-11-20: 80 mg
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
## 2016-11-20 NOTE — Patient Instructions (Signed)

## 2016-11-24 NOTE — Procedures (Signed)
Lumbar Epidural Steroid Injection - Interlaminar Approach with Fluoroscopic Guidance  Patient: Margaret Rojas      Date of Birth: 20-Jan-1976 MRN: [id number] PCP: Loura Pardon, MD      Visit Date: 11/20/2016   Universal Protocol:    Date/Time: 03/12/185:11 AM  Consent Given By: the patient  Position: PRONE  Additional Comments: Vital signs were monitored before and after the procedure. Patient was prepped and draped in the usual sterile fashion. The correct patient, procedure, and site was verified.   Injection Procedure Details:  Procedure Site One Meds Administered:  Meds ordered this encounter  Medications  . methylPREDNISolone acetate (DEPO-MEDROL) injection 80 mg  . traMADol (ULTRAM) 50 MG tablet    Sig: Take 1 tablet (50 mg total) by mouth every 8 (eight) hours as needed for moderate pain.    Dispense:  40 tablet    Refill:  0     Laterality: Left  Location/Site:  L5-S1  Needle size: 20 G  Needle type: Tuohy  Needle Placement: Paramedian epidural  Findings:  -Contrast Used: 1 mL iohexol 180 mg iodine/mL   -Comments: Excellent flow of contrast into the epidural space.  Procedure Details: Using a paramedian approach from the side mentioned above, the region overlying the inferior lamina was localized under fluoroscopic visualization and the soft tissues overlying this structure were infiltrated with 4 ml. of 1% Lidocaine without Epinephrine. The Tuohy needle was inserted into the epidural space using a paramedian approach.   The epidural space was localized using loss of resistance along with lateral and bi-planar fluoroscopic views.  After negative aspirate for air, blood, and CSF, a 2 ml. volume of Isovue-250 was injected into the epidural space and the flow of contrast was observed. Radiographs were obtained for documentation purposes.    The injectate was administered into the level noted above.   Additional Comments:  The patient tolerated the  procedure well Dressing: Band-Aid    Post-procedure details: Patient was observed during the procedure. Post-procedure instructions were reviewed.  Patient left the clinic in stable condition.

## 2016-12-24 ENCOUNTER — Ambulatory Visit (INDEPENDENT_AMBULATORY_CARE_PROVIDER_SITE_OTHER): Payer: 59 | Admitting: Physical Medicine and Rehabilitation

## 2016-12-24 ENCOUNTER — Ambulatory Visit (INDEPENDENT_AMBULATORY_CARE_PROVIDER_SITE_OTHER): Payer: 59

## 2016-12-24 ENCOUNTER — Encounter (INDEPENDENT_AMBULATORY_CARE_PROVIDER_SITE_OTHER): Payer: Self-pay | Admitting: Physical Medicine and Rehabilitation

## 2016-12-24 VITALS — BP 100/59 | HR 72 | Temp 98.3°F

## 2016-12-24 DIAGNOSIS — M47816 Spondylosis without myelopathy or radiculopathy, lumbar region: Secondary | ICD-10-CM

## 2016-12-24 MED ORDER — METHYLPREDNISOLONE ACETATE 80 MG/ML IJ SUSP
80.0000 mg | Freq: Once | INTRAMUSCULAR | Status: AC
  Administered 2016-12-24: 80 mg

## 2016-12-24 MED ORDER — LIDOCAINE HCL (PF) 1 % IJ SOLN
0.3300 mL | Freq: Once | INTRAMUSCULAR | Status: AC
  Administered 2016-12-24: 0.3 mL

## 2016-12-24 NOTE — Patient Instructions (Signed)

## 2016-12-24 NOTE — Procedures (Signed)
Lumbar Facet Joint Intra-Articular Injection(s) with Fluoroscopic Guidance  Patient: Margaret Rojas      Date of Birth: 11-17-1975 MRN: [id number] PCP: Loura Pardon, MD      Visit Date: 12/24/2016   Universal Protocol:    Date/Time: 04/11/188:41 AM  Consent Given By: the patient  Position: PRONE   Additional Comments: Vital signs were monitored before and after the procedure. Patient was prepped and draped in the usual sterile fashion. The correct patient, procedure, and site was verified.   Injection Procedure Details:  Procedure Site One Meds Administered:  Meds ordered this encounter  Medications  . lidocaine (PF) (XYLOCAINE) 1 % injection 0.3 mL  . methylPREDNISolone acetate (DEPO-MEDROL) injection 80 mg     Laterality: Left  Location/Site:  L5-S1  Needle size: 22 guage  Needle type: Spinal  Needle Placement: Articular  Findings:  -Contrast Used: 1 mL iohexol 180 mg iodine/mL   -Comments: Excellent flow of contrast producing a partial arthrogram.  Procedure Details: The fluoroscope beam is vertically oriented in AP, and the inferior recess is visualized beneath the lower pole of the inferior apophyseal process, which represents the target point for needle insertion. When direct visualization is difficult the target point is located at the medial projection of the vertebral pedicle. The region overlying each aforementioned target is locally anesthetized with a 1 to 2 ml. volume of 1% Lidocaine without Epinephrine.   The spinal needle was inserted into each of the above mentioned facet joints using biplanar fluoroscopic guidance. A 0.25 to 0.5 ml. volume of Isovue-250 was injected and a partial facet joint arthrogram was obtained. A single spot film was obtained of the resulting arthrogram.    One to 1.25 ml of the steroid/anesthetic solution was then injected into each of the facet joints noted above.   Additional Comments:  The patient tolerated the  procedure well Dressing: Band-Aid    Post-procedure details: Patient was observed during the procedure. Post-procedure instructions were reviewed.  Patient left the clinic in stable condition.
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
## 2017-01-16 ENCOUNTER — Other Ambulatory Visit: Payer: Self-pay | Admitting: Neurosurgery

## 2017-01-21 ENCOUNTER — Telehealth: Payer: Self-pay | Admitting: Vascular Surgery

## 2017-01-21 NOTE — Telephone Encounter (Signed)
Spoke to pt about appt date and time, advised to bring spine films and mailed letter for appt on 03/17/17

## 2017-01-21 NOTE — Telephone Encounter (Signed)
-----   Message from Denman George, RN sent at 01/20/2017  4:09 PM EDT ----- Regarding: needs new pt. appt. with Dr. Donnetta Hutching Please schedule a consult with Dr. Donnetta Hutching prior to ALIF on 03/25/17; remind the pt. To bring copy of LS spine films to appt.

## 2017-02-19 ENCOUNTER — Other Ambulatory Visit: Payer: Self-pay | Admitting: Obstetrics and Gynecology

## 2017-02-19 DIAGNOSIS — Z1231 Encounter for screening mammogram for malignant neoplasm of breast: Secondary | ICD-10-CM

## 2017-02-25 ENCOUNTER — Encounter: Payer: Self-pay | Admitting: Vascular Surgery

## 2017-03-06 ENCOUNTER — Encounter: Payer: Self-pay | Admitting: Family Medicine

## 2017-03-06 ENCOUNTER — Ambulatory Visit (INDEPENDENT_AMBULATORY_CARE_PROVIDER_SITE_OTHER): Payer: 59 | Admitting: Family Medicine

## 2017-03-06 VITALS — BP 106/64 | HR 67 | Temp 99.5°F | Ht 68.5 in | Wt 195.5 lb

## 2017-03-06 DIAGNOSIS — F4323 Adjustment disorder with mixed anxiety and depressed mood: Secondary | ICD-10-CM

## 2017-03-06 DIAGNOSIS — M5136 Other intervertebral disc degeneration, lumbar region: Secondary | ICD-10-CM | POA: Diagnosis not present

## 2017-03-06 MED ORDER — ZOLPIDEM TARTRATE 10 MG PO TABS
10.0000 mg | ORAL_TABLET | Freq: Every evening | ORAL | 3 refills | Status: DC | PRN
Start: 2017-03-06 — End: 2018-01-29

## 2017-03-06 MED ORDER — SERTRALINE HCL 50 MG PO TABS: 75.0000 mg | ORAL_TABLET | Freq: Every day | ORAL | 11 refills | Status: DC

## 2017-03-06 MED ORDER — ALPRAZOLAM 0.5 MG PO TABS: 0.5000 mg | ORAL_TABLET | Freq: Every day | ORAL | 0 refills | Status: DC | PRN

## 2017-03-06 NOTE — Patient Instructions (Signed)
Take care of yourself  Make sure to drink enough fluids Your exam is re assuring   No change in medicines   Good luck with your surgery
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
## 2017-03-07 ENCOUNTER — Encounter: Payer: Self-pay | Admitting: Family Medicine

## 2017-03-07 NOTE — Assessment & Plan Note (Signed)
With sleep disorder  Reviewed stressors/ coping techniques/symptoms/ support sources/ tx options and side effects in detail today  Many medical problems and surgeries this year -with a spinal fusion planned next mo  Much chronic pain  Eating for stress  Disc pros/cons of med change-she wants to stay the same with hope that pain relief from surgery will help Refill zoloft at 75 mg daily (opt to inc if needed) Continue counseling  Xanax prn (rarely uses) with caution  ambien prn sleep

## 2017-03-07 NOTE — Assessment & Plan Note (Signed)
For spinal fusion surgery upcoming next mo  Chronic pain

## 2017-03-09 ENCOUNTER — Other Ambulatory Visit: Payer: Self-pay

## 2017-03-10 ENCOUNTER — Encounter: Payer: Self-pay | Admitting: Vascular Surgery

## 2017-03-10 ENCOUNTER — Ambulatory Visit (INDEPENDENT_AMBULATORY_CARE_PROVIDER_SITE_OTHER): Payer: 59 | Admitting: Vascular Surgery

## 2017-03-10 VITALS — BP 106/74 | HR 63 | Temp 97.8°F | Resp 16 | Ht 68.5 in | Wt 195.0 lb

## 2017-03-10 DIAGNOSIS — M5137 Other intervertebral disc degeneration, lumbosacral region: Secondary | ICD-10-CM | POA: Diagnosis not present
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
## 2017-03-13 NOTE — Pre-Procedure Instructions (Signed)
Margaret Rojas  03/13/2017      Your procedure is scheduled on Wednesday July 11.  Report to [facility] Admitting at 6:30 A.M.  Call this number if you have problems the morning of surgery:  [phone]   Remember:  Do not eat food or drink liquids after midnight.  Take these medicines the morning of surgery with A SIP OF WATER: sertraline (zoloft), topiramate (Topamax) tizanidine (Zanaflex) if needed, alprazolam (Xanax) if needed, tramadol (ultram) if needed  7 days prior to surgery STOP taking any Aspirin, Aleve, Naproxen, Ibuprofen, Motrin, Advil, Goody's, BC's, all herbal medications, fish oil, and all vitamins    Do not wear jewelry, make-up or nail polish.  Do not wear lotions, powders, or perfumes, or deoderant.  Do not shave 48 hours prior to surgery.  Men may shave face and neck.  Do not bring valuables to the hospital.  [facility] is not responsible for any belongings or valuables.  Contacts, dentures or bridgework may not be worn into surgery.  Leave your suitcase in the car.  After surgery it may be brought to your room.  For patients admitted to the hospital, discharge time will be determined by your treatment team.  Patients discharged the day of surgery will not be allowed to drive home.    Special instructions:    [place]- Preparing For Surgery  Before surgery, you can play an important role. Because skin is not sterile, your skin needs to be as free of germs as possible. You can reduce the number of germs on your skin by washing with CHG (chlorahexidine gluconate) Soap before surgery.  CHG is an antiseptic cleaner which kills germs and bonds with the skin to continue killing germs even after washing.  Please do not use if you have an allergy to CHG or antibacterial soaps. If your skin becomes reddened/irritated stop using the CHG.  Do not shave (including legs and underarms) for at least 48 hours prior to first CHG shower. It is OK to  shave your face.  Please follow these instructions carefully.   1. Shower the NIGHT BEFORE SURGERY and the MORNING OF SURGERY with CHG.   2. If you chose to wash your hair, wash your hair first as usual with your normal shampoo.  3. After you shampoo, rinse your hair and body thoroughly to remove the shampoo.  4. Use CHG as you would any other liquid soap. You can apply CHG directly to the skin and wash gently with a scrungie or a clean washcloth.   5. Apply the CHG Soap to your body ONLY FROM THE NECK DOWN.  Do not use on open wounds or open sores. Avoid contact with your eyes, ears, mouth and genitals (private parts). Wash genitals (private parts) with your normal soap.  6. Wash thoroughly, paying special attention to the area where your surgery will be performed.  7. Thoroughly rinse your body with warm water from the neck down.  8. DO NOT shower/wash with your normal soap after using and rinsing off the CHG Soap.  9. Pat yourself dry with a CLEAN TOWEL.   10. Wear CLEAN PAJAMAS   11. Place CLEAN SHEETS on your bed the night of your first shower and DO NOT SLEEP WITH PETS.    Day of Surgery: Do not apply any deodorants/lotions. Please wear clean clothes to the hospital/surgery center.      Please read over the following fact sheets that you were given.  Coughing and Deep Breathing

## 2017-03-16 ENCOUNTER — Encounter (HOSPITAL_COMMUNITY): Payer: Self-pay

## 2017-03-16 ENCOUNTER — Encounter (HOSPITAL_COMMUNITY)
Admission: RE | Admit: 2017-03-16 | Discharge: 2017-03-16 | Disposition: A | Discharge status: Home / Self Care | Payer: 59 | Source: Ambulatory Visit | Attending: Neurosurgery | Admitting: Neurosurgery

## 2017-03-16 DIAGNOSIS — Z01812 Encounter for preprocedural laboratory examination: Secondary | ICD-10-CM | POA: Diagnosis not present

## 2017-03-16 DIAGNOSIS — Z0183 Encounter for blood typing: Secondary | ICD-10-CM | POA: Insufficient documentation

## 2017-03-16 DIAGNOSIS — M5136 Other intervertebral disc degeneration, lumbar region: Secondary | ICD-10-CM | POA: Diagnosis not present

## 2017-03-16 HISTORY — DX: Unspecified osteoarthritis, unspecified site: M19.90

## 2017-03-16 HISTORY — DX: Family history of other specified conditions: Z84.89

## 2017-03-16 HISTORY — DX: Gastro-esophageal reflux disease without esophagitis: K21.9

## 2017-03-16 LAB — BASIC METABOLIC PANEL
ANION GAP: 6 (ref 5–15)
BUN: 16 mg/dL (ref 6–20)
CHLORIDE: 107 mmol/L (ref 101–111)
CO2: 26 mmol/L (ref 22–32)
Calcium: 9 mg/dL (ref 8.9–10.3)
Creatinine, Ser: 0.84 mg/dL (ref 0.44–1.00)
GFR calc Af Amer: >60 mL/min (ref >60)
GFR calc non Af Amer: >60 mL/min (ref >60)
GLUCOSE: 78 mg/dL (ref 65–99)
POTASSIUM: 3.7 mmol/L (ref 3.5–5.1)
Sodium: 139 mmol/L (ref 135–145)

## 2017-03-16 LAB — TYPE AND SCREEN
ABO/RH(D): A NEG
Antibody Screen: NEGATIVE

## 2017-03-16 LAB — CBC
HEMATOCRIT: 43.5 % (ref 36.0–46.0)
HEMOGLOBIN: 14.3 g/dL (ref 12.0–15.0)
MCH: 30.9 pg (ref 26.0–34.0)
MCHC: 32.9 g/dL (ref 30.0–36.0)
MCV: 94 fL (ref 78.0–100.0)
Platelets: 247 10*3/uL (ref 150–400)
RBC: 4.63 MIL/uL (ref 3.87–5.11)
RDW: 13 % (ref 11.5–15.5)
WBC: 6.2 10*3/uL (ref 4.0–10.5)

## 2017-03-16 LAB — SURGICAL PCR SCREEN
MRSA, PCR: NEGATIVE
Staphylococcus aureus: POSITIVE — AB

## 2017-03-16 LAB — HCG, SERUM, QUALITATIVE: PREG SERUM: NEGATIVE

## 2017-03-16 LAB — ABO/RH: ABO/RH(D): A NEG

## 2017-03-16 MED ORDER — CHLORHEXIDINE GLUCONATE CLOTH 2 % EX PADS: 6.0000 | MEDICATED_PAD | Freq: Once | CUTANEOUS | Status: DC

## 2017-03-16 MED ORDER — CHLORHEXIDINE GLUCONATE 4 % EX LIQD: 60.0000 mL | Freq: Once | CUTANEOUS | Status: DC

## 2017-03-16 NOTE — Progress Notes (Signed)
PCR positive for MSSA, negative for MRSA. Patient notified and prescription called to pharmacy

## 2017-03-16 NOTE — Progress Notes (Signed)
PCP - Loura Pardon No cardiac hx or cardiac workup per patient.  No complaints of shortness of breath, fever, cough and chest pain at PAT appointment   Patient verbalized understanding of instructions that were given to them at the PAT appointment. Patient was also instructed that they will need to review over the PAT instructions again at home before surgery.

## 2017-03-17 ENCOUNTER — Ambulatory Visit: Payer: 59 | Admitting: Vascular Surgery

## 2017-03-19 ENCOUNTER — Ambulatory Visit
Admission: RE | Admit: 2017-03-19 | Discharge: 2017-03-19 | Disposition: A | Discharge status: Home / Self Care | Payer: 59 | Source: Ambulatory Visit | Attending: Obstetrics and Gynecology | Admitting: Obstetrics and Gynecology

## 2017-03-19 ENCOUNTER — Other Ambulatory Visit: Payer: Self-pay | Admitting: Obstetrics and Gynecology

## 2017-03-19 ENCOUNTER — Ambulatory Visit: Payer: 59

## 2017-03-19 DIAGNOSIS — N63 Unspecified lump in unspecified breast: Secondary | ICD-10-CM

## 2017-03-19 DIAGNOSIS — N6320 Unspecified lump in the left breast, unspecified quadrant: Secondary | ICD-10-CM

## 2017-03-23 ENCOUNTER — Other Ambulatory Visit: Payer: 59

## 2017-03-25 ENCOUNTER — Inpatient Hospital Stay (HOSPITAL_COMMUNITY): Payer: 59

## 2017-03-25 ENCOUNTER — Inpatient Hospital Stay (HOSPITAL_COMMUNITY)
Admission: RE | Admit: 2017-03-25 | Discharge: 2017-03-26 | DRG: 460 | Disposition: A | Discharge status: Home / Self Care | Payer: 59 | Source: Ambulatory Visit | Attending: Neurosurgery | Admitting: Neurosurgery

## 2017-03-25 ENCOUNTER — Encounter (HOSPITAL_COMMUNITY)
Admission: RE | Disposition: A | Discharge status: Home / Self Care | Payer: Self-pay | Source: Ambulatory Visit | Attending: Neurosurgery

## 2017-03-25 ENCOUNTER — Inpatient Hospital Stay (HOSPITAL_COMMUNITY): Payer: 59 | Admitting: Certified Registered Nurse Anesthetist

## 2017-03-25 ENCOUNTER — Encounter (HOSPITAL_COMMUNITY): Payer: Self-pay | Admitting: Urology

## 2017-03-25 DIAGNOSIS — Z823 Family history of stroke: Secondary | ICD-10-CM

## 2017-03-25 DIAGNOSIS — M4807 Spinal stenosis, lumbosacral region: Principal | ICD-10-CM | POA: Diagnosis present

## 2017-03-25 DIAGNOSIS — Z882 Allergy status to sulfonamides status: Secondary | ICD-10-CM | POA: Diagnosis not present

## 2017-03-25 DIAGNOSIS — Z8349 Family history of other endocrine, nutritional and metabolic diseases: Secondary | ICD-10-CM | POA: Diagnosis not present

## 2017-03-25 DIAGNOSIS — M5137 Other intervertebral disc degeneration, lumbosacral region: Secondary | ICD-10-CM | POA: Diagnosis present

## 2017-03-25 DIAGNOSIS — Z793 Long term (current) use of hormonal contraceptives: Secondary | ICD-10-CM | POA: Diagnosis not present

## 2017-03-25 DIAGNOSIS — Z887 Allergy status to serum and vaccine status: Secondary | ICD-10-CM

## 2017-03-25 DIAGNOSIS — Z87891 Personal history of nicotine dependence: Secondary | ICD-10-CM | POA: Diagnosis not present

## 2017-03-25 DIAGNOSIS — Z419 Encounter for procedure for purposes other than remedying health state, unspecified: Secondary | ICD-10-CM

## 2017-03-25 DIAGNOSIS — M4317 Spondylolisthesis, lumbosacral region: Secondary | ICD-10-CM

## 2017-03-25 DIAGNOSIS — M5117 Intervertebral disc disorders with radiculopathy, lumbosacral region: Secondary | ICD-10-CM | POA: Diagnosis present

## 2017-03-25 HISTORY — PX: ANTERIOR LUMBAR FUSION: SHX1170

## 2017-03-25 HISTORY — PX: ABDOMINAL EXPOSURE: SHX5708

## 2017-03-25 SURGERY — ANTERIOR LUMBAR FUSION 1 LEVEL
Anesthesia: General | Site: Spine Lumbar

## 2017-03-25 MED ORDER — ONDANSETRON HCL 4 MG/2ML IJ SOLN
INTRAMUSCULAR | Status: DC | PRN
  Administered 2017-03-25: 4 mg via INTRAVENOUS

## 2017-03-25 MED ORDER — NEOSTIGMINE METHYLSULFATE 5 MG/5ML IV SOSY
PREFILLED_SYRINGE | INTRAVENOUS | Status: AC
  Filled 2017-03-25: qty 5

## 2017-03-25 MED ORDER — ADULT MULTIVITAMIN W/MINERALS CH
1.0000 | ORAL_TABLET | Freq: Every day | ORAL | Status: DC
  Administered 2017-03-25: 1 via ORAL
  Filled 2017-03-25: qty 1

## 2017-03-25 MED ORDER — TIZANIDINE HCL 4 MG PO TABS
4.0000 mg | ORAL_TABLET | Freq: Three times a day (TID) | ORAL | Status: DC | PRN
  Administered 2017-03-25 – 2017-03-26 (×2): 4 mg via ORAL
  Filled 2017-03-25 (×2): qty 1

## 2017-03-25 MED ORDER — ALPRAZOLAM 0.5 MG PO TABS: 0.5000 mg | ORAL_TABLET | Freq: Every evening | ORAL | Status: DC | PRN

## 2017-03-25 MED ORDER — HYDROMORPHONE HCL 1 MG/ML IJ SOLN
0.2500 mg | INTRAMUSCULAR | Status: DC | PRN
  Administered 2017-03-25 (×2): 0.5 mg via INTRAVENOUS

## 2017-03-25 MED ORDER — PHENYLEPHRINE HCL 10 MG/ML IJ SOLN
INTRAMUSCULAR | Status: DC | PRN
  Administered 2017-03-25: 15 ug/min via INTRAVENOUS

## 2017-03-25 MED ORDER — 0.9 % SODIUM CHLORIDE (POUR BTL) OPTIME
TOPICAL | Status: DC | PRN
  Administered 2017-03-25: 1000 mL

## 2017-03-25 MED ORDER — EMERGEN-C VITAMIN C PO PACK: 1.0000 | PACK | Freq: Every day | ORAL | Status: DC

## 2017-03-25 MED ORDER — LACTATED RINGERS IV SOLN
INTRAVENOUS | Status: DC
  Administered 2017-03-25: 08:00:00 via INTRAVENOUS

## 2017-03-25 MED ORDER — DEXAMETHASONE SODIUM PHOSPHATE 10 MG/ML IJ SOLN
10.0000 mg | INTRAMUSCULAR | Status: AC
  Administered 2017-03-25: 10 mg via INTRAVENOUS
  Filled 2017-03-25: qty 1

## 2017-03-25 MED ORDER — TOPIRAMATE 100 MG PO TABS
100.0000 mg | ORAL_TABLET | Freq: Every day | ORAL | Status: DC
  Filled 2017-03-25: qty 1

## 2017-03-25 MED ORDER — ZOLPIDEM TARTRATE 5 MG PO TABS: 10.0000 mg | ORAL_TABLET | Freq: Every evening | ORAL | Status: DC | PRN

## 2017-03-25 MED ORDER — MIDAZOLAM HCL 2 MG/2ML IJ SOLN
INTRAMUSCULAR | Status: AC
  Filled 2017-03-25: qty 2

## 2017-03-25 MED ORDER — HYDROMORPHONE HCL 1 MG/ML IJ SOLN
0.5000 mg | INTRAMUSCULAR | Status: DC | PRN
  Administered 2017-03-25: 0.5 mg via INTRAVENOUS
  Filled 2017-03-25: qty 0.5

## 2017-03-25 MED ORDER — PROMETHAZINE HCL 25 MG/ML IJ SOLN: 6.2500 mg | INTRAMUSCULAR | Status: DC | PRN

## 2017-03-25 MED ORDER — KETAMINE HCL-SODIUM CHLORIDE 100-0.9 MG/10ML-% IV SOSY
PREFILLED_SYRINGE | INTRAVENOUS | Status: AC
  Filled 2017-03-25: qty 10

## 2017-03-25 MED ORDER — PROPOFOL 10 MG/ML IV BOLUS
INTRAVENOUS | Status: DC | PRN
  Administered 2017-03-25: 120 mg via INTRAVENOUS

## 2017-03-25 MED ORDER — CEFAZOLIN SODIUM-DEXTROSE 2-4 GM/100ML-% IV SOLN
2.0000 g | Freq: Three times a day (TID) | INTRAVENOUS | Status: DC
  Administered 2017-03-25 – 2017-03-26 (×3): 2 g via INTRAVENOUS
  Filled 2017-03-25 (×2): qty 100

## 2017-03-25 MED ORDER — ROCURONIUM BROMIDE 10 MG/ML (PF) SYRINGE
PREFILLED_SYRINGE | INTRAVENOUS | Status: DC | PRN
Start: 2017-03-25 — End: 2017-03-25
  Administered 2017-03-25: 30 mg via INTRAVENOUS
  Administered 2017-03-25: 50 mg via INTRAVENOUS

## 2017-03-25 MED ORDER — ONDANSETRON HCL 4 MG/2ML IJ SOLN: 4.0000 mg | Freq: Four times a day (QID) | INTRAMUSCULAR | Status: DC | PRN

## 2017-03-25 MED ORDER — ONDANSETRON HCL 4 MG PO TABS: 4.0000 mg | ORAL_TABLET | Freq: Four times a day (QID) | ORAL | Status: DC | PRN

## 2017-03-25 MED ORDER — ROCURONIUM BROMIDE 50 MG/5ML IV SOLN
INTRAVENOUS | Status: AC
  Filled 2017-03-25: qty 1

## 2017-03-25 MED ORDER — PANTOPRAZOLE SODIUM 40 MG IV SOLR
40.0000 mg | Freq: Every day | INTRAVENOUS | Status: DC
  Administered 2017-03-25: 40 mg via INTRAVENOUS
  Filled 2017-03-25: qty 40

## 2017-03-25 MED ORDER — SERTRALINE HCL 50 MG PO TABS
75.0000 mg | ORAL_TABLET | Freq: Every day | ORAL | Status: DC
  Administered 2017-03-25: 75 mg via ORAL
  Filled 2017-03-25: qty 2
  Filled 2017-03-25: qty 1.5

## 2017-03-25 MED ORDER — PROPOFOL 10 MG/ML IV BOLUS
INTRAVENOUS | Status: AC
  Filled 2017-03-25: qty 40

## 2017-03-25 MED ORDER — GLYCOPYRROLATE 0.2 MG/ML IJ SOLN
INTRAMUSCULAR | Status: DC | PRN
  Administered 2017-03-25: 0.6 mg via INTRAVENOUS
  Administered 2017-03-25: 0.2 mg via INTRAVENOUS

## 2017-03-25 MED ORDER — THROMBIN 20000 UNITS EX SOLR
CUTANEOUS | Status: AC
  Filled 2017-03-25: qty 20000

## 2017-03-25 MED ORDER — TRAMADOL HCL 50 MG PO TABS: 50.0000 mg | ORAL_TABLET | Freq: Three times a day (TID) | ORAL | Status: DC | PRN

## 2017-03-25 MED ORDER — BACITRACIN 50000 UNITS IM SOLR
INTRAMUSCULAR | Status: DC | PRN
  Administered 2017-03-25: 10:00:00

## 2017-03-25 MED ORDER — ACETAMINOPHEN 325 MG PO TABS: 650.0000 mg | ORAL_TABLET | ORAL | Status: DC | PRN

## 2017-03-25 MED ORDER — NEOSTIGMINE METHYLSULFATE 10 MG/10ML IV SOLN
INTRAVENOUS | Status: DC | PRN
  Administered 2017-03-25: 4 mg via INTRAVENOUS

## 2017-03-25 MED ORDER — HYDROMORPHONE HCL 1 MG/ML IJ SOLN
INTRAMUSCULAR | Status: AC
  Filled 2017-03-25: qty 0.5

## 2017-03-25 MED ORDER — MENTHOL 3 MG MT LOZG: 1.0000 | LOZENGE | OROMUCOSAL | Status: DC | PRN

## 2017-03-25 MED ORDER — HYDROMORPHONE HCL 1 MG/ML IJ SOLN: 0.2500 mg | INTRAMUSCULAR | Status: DC | PRN

## 2017-03-25 MED ORDER — ONDANSETRON HCL 4 MG/2ML IJ SOLN
INTRAMUSCULAR | Status: AC
  Filled 2017-03-25: qty 2

## 2017-03-25 MED ORDER — LACTATED RINGERS IV SOLN
INTRAVENOUS | Status: DC | PRN
  Administered 2017-03-25 (×2): via INTRAVENOUS

## 2017-03-25 MED ORDER — FENTANYL CITRATE (PF) 100 MCG/2ML IJ SOLN
INTRAMUSCULAR | Status: DC | PRN
  Administered 2017-03-25: 100 ug via INTRAVENOUS
  Administered 2017-03-25 (×2): 50 ug via INTRAVENOUS
  Administered 2017-03-25: 100 ug via INTRAVENOUS
  Administered 2017-03-25 (×3): 50 ug via INTRAVENOUS

## 2017-03-25 MED ORDER — PHENYLEPHRINE 40 MCG/ML (10ML) SYRINGE FOR IV PUSH (FOR BLOOD PRESSURE SUPPORT)
PREFILLED_SYRINGE | INTRAVENOUS | Status: AC
  Filled 2017-03-25: qty 10

## 2017-03-25 MED ORDER — CEFAZOLIN SODIUM-DEXTROSE 2-4 GM/100ML-% IV SOLN
2.0000 g | INTRAVENOUS | Status: AC
  Administered 2017-03-25: 2 g via INTRAVENOUS
  Filled 2017-03-25: qty 100

## 2017-03-25 MED ORDER — ALBUMIN HUMAN 5 % IV SOLN
INTRAVENOUS | Status: DC | PRN
  Administered 2017-03-25 (×2): via INTRAVENOUS

## 2017-03-25 MED ORDER — KETAMINE HCL 10 MG/ML IJ SOLN
INTRAMUSCULAR | Status: DC | PRN
  Administered 2017-03-25: 10 mg via INTRAVENOUS
  Administered 2017-03-25: 50 mg via INTRAVENOUS

## 2017-03-25 MED ORDER — SODIUM CHLORIDE 0.9% FLUSH
3.0000 mL | Freq: Two times a day (BID) | INTRAVENOUS | Status: DC
  Administered 2017-03-25: 3 mL via INTRAVENOUS

## 2017-03-25 MED ORDER — LACTATED RINGERS IV SOLN: INTRAVENOUS | Status: DC | PRN

## 2017-03-25 MED ORDER — PHENOL 1.4 % MT LIQD: 1.0000 | OROMUCOSAL | Status: DC | PRN

## 2017-03-25 MED ORDER — THROMBIN 20000 UNITS EX SOLR
CUTANEOUS | Status: DC | PRN
  Administered 2017-03-25: 10:00:00 via TOPICAL

## 2017-03-25 MED ORDER — ACETAMINOPHEN 650 MG RE SUPP: 650.0000 mg | RECTAL | Status: DC | PRN

## 2017-03-25 MED ORDER — SODIUM CHLORIDE 0.9 % IV SOLN: 250.0000 mL | INTRAVENOUS | Status: DC

## 2017-03-25 MED ORDER — OXYCODONE HCL 5 MG PO TABS
5.0000 mg | ORAL_TABLET | ORAL | Status: DC | PRN
  Administered 2017-03-25 (×2): 10 mg via ORAL
  Administered 2017-03-25: 5 mg via ORAL
  Administered 2017-03-26 (×3): 10 mg via ORAL
  Filled 2017-03-25 (×5): qty 2

## 2017-03-25 MED ORDER — FENTANYL CITRATE (PF) 250 MCG/5ML IJ SOLN
INTRAMUSCULAR | Status: AC
  Filled 2017-03-25: qty 5

## 2017-03-25 MED ORDER — OXYCODONE HCL 5 MG PO TABS
ORAL_TABLET | ORAL | Status: AC
  Filled 2017-03-25: qty 1

## 2017-03-25 MED ORDER — SODIUM CHLORIDE 0.9% FLUSH: 3.0000 mL | INTRAVENOUS | Status: DC | PRN

## 2017-03-25 MED ORDER — PHENYLEPHRINE 40 MCG/ML (10ML) SYRINGE FOR IV PUSH (FOR BLOOD PRESSURE SUPPORT)
PREFILLED_SYRINGE | INTRAVENOUS | Status: DC | PRN
  Administered 2017-03-25: 80 ug via INTRAVENOUS
  Administered 2017-03-25: 120 ug via INTRAVENOUS

## 2017-03-25 MED ORDER — ALUM & MAG HYDROXIDE-SIMETH 200-200-20 MG/5ML PO SUSP: 30.0000 mL | Freq: Four times a day (QID) | ORAL | Status: DC | PRN

## 2017-03-25 MED ORDER — LIDOCAINE 2% (20 MG/ML) 5 ML SYRINGE
INTRAMUSCULAR | Status: DC | PRN
  Administered 2017-03-25: 100 mg via INTRAVENOUS

## 2017-03-25 MED ORDER — MIDAZOLAM HCL 5 MG/5ML IJ SOLN
INTRAMUSCULAR | Status: DC | PRN
  Administered 2017-03-25: 2 mg via INTRAVENOUS

## 2017-03-25 MED ORDER — CYCLOBENZAPRINE HCL 10 MG PO TABS
10.0000 mg | ORAL_TABLET | Freq: Three times a day (TID) | ORAL | Status: DC | PRN
  Administered 2017-03-25 – 2017-03-26 (×2): 10 mg via ORAL
  Filled 2017-03-25 (×2): qty 1

## 2017-03-25 MED ORDER — LIDOCAINE HCL (CARDIAC) 20 MG/ML IV SOLN
INTRAVENOUS | Status: AC
  Filled 2017-03-25: qty 5

## 2017-03-25 SURGICAL SUPPLY — 86 items
ADH SKN CLS APL DERMABOND .7 (GAUZE/BANDAGES/DRESSINGS) ×2
ALLOFUSE SELECT 5CC (Bone Implant) ×3 IMPLANT
ANCH SPNL 27 LMBR MIS (Anchor) ×6 IMPLANT
ANCHOR LUMBAR 27 (Anchor) ×3 IMPLANT
APL SKNCLS STERI-STRIP NONHPOA (GAUZE/BANDAGES/DRESSINGS)
APPLIER CLIP 11 MED OPEN (CLIP) ×3
APR CLP MED 11 20 MLT OPN (CLIP) ×2
BAG DECANTER FOR FLEXI CONT (MISCELLANEOUS) ×3 IMPLANT
BENZOIN TINCTURE PRP APPL 2/3 (GAUZE/BANDAGES/DRESSINGS) IMPLANT
BUR MATCHSTICK NEURO 3.0 LAGG (BURR) IMPLANT
CANISTER SUCT 3000ML PPV (MISCELLANEOUS) ×3 IMPLANT
CARTRIDGE OIL MAESTRO DRILL (MISCELLANEOUS) ×2 IMPLANT
CLIP APPLIE 11 MED OPEN (CLIP) ×2 IMPLANT
CLIP LIGATING EXTRA MED SLVR (CLIP) ×3 IMPLANT
CLIP LIGATING EXTRA SM BLUE (MISCELLANEOUS) ×3 IMPLANT
COVER BACK TABLE 24X17X13 BIG (DRAPES) IMPLANT
DERMABOND ADVANCED (GAUZE/BANDAGES/DRESSINGS) ×1
DERMABOND ADVANCED .7 DNX12 (GAUZE/BANDAGES/DRESSINGS) ×2 IMPLANT
DIFFUSER DRILL AIR PNEUMATIC (MISCELLANEOUS) ×3 IMPLANT
DRAPE C-ARM 42X72 X-RAY (DRAPES) ×9 IMPLANT
DRAPE LAPAROTOMY 100X72X124 (DRAPES) ×3 IMPLANT
DRSG OPSITE POSTOP 4X8 (GAUZE/BANDAGES/DRESSINGS) ×1 IMPLANT
ELECT BLADE 4.0 EZ CLEAN MEGAD (MISCELLANEOUS) ×3
ELECT BLADE 6.5 EXT (BLADE) ×3 IMPLANT
ELECT REM PT RETURN 9FT ADLT (ELECTROSURGICAL) ×3
ELECTRODE BLDE 4.0 EZ CLN MEGD (MISCELLANEOUS) ×2 IMPLANT
ELECTRODE REM PT RTRN 9FT ADLT (ELECTROSURGICAL) ×2 IMPLANT
GAUZE SPONGE 4X4 12PLY STRL (GAUZE/BANDAGES/DRESSINGS) ×3 IMPLANT
GAUZE SPONGE 4X4 16PLY XRAY LF (GAUZE/BANDAGES/DRESSINGS) IMPLANT
GLOVE BIO SURGEON STRL SZ7 (GLOVE) IMPLANT
GLOVE BIO SURGEON STRL SZ8 (GLOVE) ×3 IMPLANT
GLOVE BIOGEL PI IND STRL 7.0 (GLOVE) IMPLANT
GLOVE BIOGEL PI INDICATOR 7.0 (GLOVE)
GLOVE EXAM NITRILE LRG STRL (GLOVE) IMPLANT
GLOVE EXAM NITRILE XL STR (GLOVE) IMPLANT
GLOVE EXAM NITRILE XS STR PU (GLOVE) IMPLANT
GLOVE INDICATOR 8.5 STRL (GLOVE) ×3 IMPLANT
GLOVE SS BIOGEL STRL SZ 7.5 (GLOVE) ×2 IMPLANT
GLOVE SUPERSENSE BIOGEL SZ 7.5 (GLOVE) ×1
GOWN STRL REUS W/ TWL LRG LVL3 (GOWN DISPOSABLE) ×2 IMPLANT
GOWN STRL REUS W/ TWL XL LVL3 (GOWN DISPOSABLE) ×2 IMPLANT
GOWN STRL REUS W/TWL 2XL LVL3 (GOWN DISPOSABLE) ×3 IMPLANT
GOWN STRL REUS W/TWL LRG LVL3 (GOWN DISPOSABLE) ×3
GOWN STRL REUS W/TWL XL LVL3 (GOWN DISPOSABLE) ×3
INSERT FOGARTY 61MM (MISCELLANEOUS) IMPLANT
INSERT FOGARTY SM (MISCELLANEOUS) IMPLANT
KIT BASIN OR (CUSTOM PROCEDURE TRAY) ×3 IMPLANT
KIT INFUSE XX SMALL 0.7CC (Orthopedic Implant) ×1 IMPLANT
LOOP VESSEL MAXI BLUE (MISCELLANEOUS) IMPLANT
LOOP VESSEL MINI RED (MISCELLANEOUS) IMPLANT
NDL SPNL 18GX3.5 QUINCKE PK (NEEDLE) ×2 IMPLANT
NEEDLE SPNL 18GX3.5 QUINCKE PK (NEEDLE) ×3 IMPLANT
NS IRRIG 1000ML POUR BTL (IV SOLUTION) ×3 IMPLANT
OIL CARTRIDGE MAESTRO DRILL (MISCELLANEOUS) ×3
PACK LAMINECTOMY NEURO (CUSTOM PROCEDURE TRAY) ×3 IMPLANT
PAD ARMBOARD 7.5X6 YLW CONV (MISCELLANEOUS) ×6 IMPLANT
SPACER INDEPEND 29X39MM 15D 13 (Spacer) ×1 IMPLANT
SPONGE INTESTINAL PEANUT (DISPOSABLE) ×3 IMPLANT
SPONGE LAP 18X18 X RAY DECT (DISPOSABLE) IMPLANT
SPONGE LAP 4X18 X RAY DECT (DISPOSABLE) IMPLANT
SPONGE SURGIFOAM ABS GEL 100 (HEMOSTASIS) ×6 IMPLANT
STAPLER VISISTAT 35W (STAPLE) ×3 IMPLANT
STRIP CLOSURE SKIN 1/2X4 (GAUZE/BANDAGES/DRESSINGS) IMPLANT
SUT PROLENE 4 0 RB 1 (SUTURE)
SUT PROLENE 4-0 RB1 .5 CRCL 36 (SUTURE) IMPLANT
SUT PROLENE 5 0 CC1 (SUTURE) IMPLANT
SUT PROLENE 6 0 C 1 30 (SUTURE) ×3 IMPLANT
SUT PROLENE 6 0 CC (SUTURE) IMPLANT
SUT SILK 0 TIES 10X30 (SUTURE) ×3 IMPLANT
SUT SILK 2 0 TIES 10X30 (SUTURE) ×3 IMPLANT
SUT SILK 2 0SH CR/8 30 (SUTURE) IMPLANT
SUT SILK 3 0 SH CR/8 (SUTURE) IMPLANT
SUT SILK 3 0 TIES 10X30 (SUTURE) ×3 IMPLANT
SUT SILK 3 0SH CR/8 30 (SUTURE) IMPLANT
SUT VIC AB 0 CT1 18XCR BRD8 (SUTURE) ×2 IMPLANT
SUT VIC AB 0 CT1 27 (SUTURE) ×6
SUT VIC AB 0 CT1 27XBRD ANBCTR (SUTURE) ×4 IMPLANT
SUT VIC AB 0 CT1 27XBRD ANTBC (SUTURE) IMPLANT
SUT VIC AB 0 CT1 8-18 (SUTURE) ×3
SUT VIC AB 2-0 CT1 18 (SUTURE) ×3 IMPLANT
SUT VICRYL 4-0 PS2 18IN ABS (SUTURE) ×3 IMPLANT
TISSUE ALLOFUSE SELECT 5CC (Bone Implant) IMPLANT
TOWEL GREEN STERILE (TOWEL DISPOSABLE) ×3 IMPLANT
TOWEL GREEN STERILE FF (TOWEL DISPOSABLE) ×3 IMPLANT
TRAY FOLEY W/METER SILVER 16FR (SET/KITS/TRAYS/PACK) ×3 IMPLANT
WATER STERILE IRR 1000ML POUR (IV SOLUTION) ×3 IMPLANT

## 2017-03-25 NOTE — Op Note (Signed)
Preoperative diagnosis: Lumbar degenerative disc disease L5-S1 with bilateral L5 radiculopathies left greater than right slight grade 1 spondylolisthesis L5-S1  Postoperative diagnosis: Same  Procedure: Anterior lumbar interbody fusion utilizing the globus integrated interbody cage packed with Allostem BMP and 3-27 mm titanium [name]  Surgeon: Dominica Severin Rand Etchison  Co-surgeon: Todd early  Asst.: Glenford Peers  Anesthesia: Gen.  EBL: Minimal  History of present illness: Patient is a very pleasant 41 year old female has had long-standing back and bilateral leg pain that's been refractory to all forms of conservative treatment over the years physical therapy anti-inflammatories steroid injections facet blocks. Workup revealed severely degenerative collapse probably progressive over the last several years at L5-S1. Due to patient progressive clinical syndrome imaging findings and failure conservative treatment I recommended anterior lumbar interbody fusion at that level. I extensively went over the risks and benefits of the operation with her as well as perioperative course expectations of outcome and alternatives of surgery and she understood and agreed to proceed forward.  Operative procedure: Patient brought into the or was induced on general anesthesia positioned supine the left lower quadrant was exposed Dr. early identify appropriate landmarks and performed the exposure that this will be handled in a separate dictation. After adequate exposure and retractor and placed exposing the L5-S1 disc space, I brought C-arm fluoroscopy and confirmed midline and identification of the L5-S1 disc space then incised the disc space. Cleaned up the disc space radically pituitary rongeurs scraped all disc prepare the endplates followed down the posterior annulus. Under been both posterior endplates at A1-P3 confirming depth with fluoroscopy. After adequate discectomy and been achieved I sized up to implants utilizing  initially an 11 mm trial and subsequent 13 mm trial large cage 15 lordosis felt this had good approximation of the endplates as well as good opening up of the disc space and indirect decompression of the L5 nerve roots. So I selected a 15 large 13 mm cage packed with allostem and BMP preloaded the Shanks and then inserted this cage under fluoroscopy 1-2 mm deep to the anterior vertebra line. I then advanced to the superior shank and subsequent inferior [name] and then removed the inserter. Upon inspection of the inferior left-sided shank the locking mechanism was partially damaged I was able to engage part of the locking mechanism and the shank was in there solidly and showed no evidence of being loose. The of the locking mechanism was engaged wounds and copious irrigated meticulous hemostasis was maintained retractor was removed under direct visualization and the wound was then closed in layers with interrupted Vicryl and the fascia of the external oblique subcutaneous tissue was closed with after Vicryl running 4 subcuticular Dermabond and stand a honeycomb large was applied. At the end of case all needle counts sponge counts were correct postop x-ray confirmed no residual sponge material in the abdomen.

## 2017-03-25 NOTE — Evaluation (Signed)
Physical Therapy Evaluation Patient Details Name: Margaret Rojas MRN: [id number] DOB: August 09, 1976 Today's Date: 03/25/2017   History of Present Illness  Pt is a 41 y/o female s/p L5-S1 ALIF. PMH includes allergies, anxiety/depression, mibrane, platar fasciitis, R knee surgery, shoulder surgery.   Clinical Impression  Pt is s/p surgery above with deficits below. PTA, pt was independent with functional mobility. Upon evaluation, pt limited by pain, nausea, weakness, and decreased balance. Pt did report pain in LE was not there following surgery. Pt requiring min guard assist for mobility this session. Limited distance secondary to reports of increasing nausea. Pt has all DME at home, and will not need any follow up PT. Encouraged to mobilize with NT/RN later in evening. Will continue to follow acutely to maximize functional mobility independence.     Follow Up Recommendations No PT follow up;Supervision/Assistance - 24 hour    Equipment Recommendations  None recommended by PT    Recommendations for Other Services       Precautions / Restrictions Precautions Precautions: Back Precaution Booklet Issued: Yes (comment) Precaution Comments: Reviewed back precautions with pt and pt's husband.  Required Braces or Orthoses: Spinal Brace Spinal Brace: Lumbar corset;Applied in sitting position Restrictions Weight Bearing Restrictions: No      Mobility  Bed Mobility Overal bed mobility: Needs Assistance Bed Mobility: Rolling;Sidelying to Sit;Sit to Sidelying Rolling: Supervision Sidelying to sit: Min guard     Sit to sidelying: Supervision General bed mobility comments: Supervision to min guard for safety. Verbal cues for use of log roll technique.   Transfers Overall transfer level: Needs assistance Equipment used: None Transfers: Sit to/from Stand Sit to Stand: Min assist         General transfer comment: Min A for steadying once standing. Verbal cues to power through LE.    Ambulation/Gait Ambulation/Gait assistance: Min guard Ambulation Distance (Feet): 50 Feet Assistive device: 1 person hand held assist (IV pole ) Gait Pattern/deviations: Step-through pattern;Decreased stride length Gait velocity: Decreased Gait velocity interpretation: Below normal speed for age/gender General Gait Details: Slow, guarded gait secondry to post op pain and weakness. Requesting to go back to room as pt was nauseous. Encouraged continued mobility throughout the day. HHA and use of IV pole for steadying.   Stairs            Wheelchair Mobility    Modified Rankin (Stroke Patients Only)       Balance Overall balance assessment: Needs assistance Sitting-balance support: No upper extremity supported;Feet supported Sitting balance-Leahy Scale: Good     Standing balance support: Bilateral upper extremity supported;During functional activity Standing balance-Leahy Scale: Fair Standing balance comment: Able to maintain static standing without use of UE                              Pertinent Vitals/Pain Pain Assessment: 0-10 Pain Score: 5  Pain Location: back  Pain Descriptors / Indicators: Aching;Operative site guarding;Sore;Grimacing Pain Intervention(s): Limited activity within patient's tolerance;Monitored during session;Repositioned    Home Living Family/patient expects to be discharged to:: Private residence Living Arrangements: Spouse/significant other Available Help at Discharge: Family;Available 24 hours/day Type of Home: House Home Access: Stairs to enter Entrance Stairs-Rails: None Entrance Stairs-Number of Steps: 2 Home Layout: One level Home Equipment: Shower seat;Other (comment) (adjustable bed. )      Prior Function Level of Independence: Independent               Hand Dominance  Extremity/Trunk Assessment   Upper Extremity Assessment Upper Extremity Assessment: Defer to OT evaluation    Lower Extremity  Assessment Lower Extremity Assessment: Generalized weakness (reports having no LE pain as she did prior )    Cervical / Trunk Assessment Cervical / Trunk Assessment: Other exceptions Cervical / Trunk Exceptions: s/p back surgery   Communication   Communication: No difficulties  Cognition Arousal/Alertness: Awake/alert Behavior During Therapy: WFL for tasks assessed/performed Overall Cognitive Status: Within Functional Limits for tasks assessed                                        General Comments General comments (skin integrity, edema, etc.): Encouraged pt to mobilize with RN/NT later in evening, however, pt saying she did not want to. Educated about importance of mobility throughout the day to decreased pain and for improved recovery. Pt agreeable. Educated about generalized walking program to perform at home.     Exercises     Assessment/Plan    PT Assessment Patient needs continued PT services  PT Problem List Decreased strength;Decreased activity tolerance;Decreased balance;Decreased mobility;Decreased knowledge of use of DME;Pain;Decreased knowledge of precautions       PT Treatment Interventions DME instruction;Gait training;Stair training;Functional mobility training;Therapeutic activities;Neuromuscular re-education;Patient/family education    PT Goals (Current goals can be found in the Care Plan section)  Acute Rehab PT Goals Patient Stated Goal: to go home  PT Goal Formulation: With patient Time For Goal Achievement: 04/01/17 Potential to Achieve Goals: Good    Frequency Min 5X/week   Barriers to discharge        Co-evaluation               AM-PAC PT "6 Clicks" Daily Activity  Outcome Measure Difficulty turning over in bed (including adjusting bedclothes, sheets and blankets)?: A Little Difficulty moving from lying on back to sitting on the side of the bed? : A Little Difficulty sitting down on and standing up from a chair with arms  (e.g., wheelchair, bedside commode, etc,.)?: Total Help needed moving to and from a bed to chair (including a wheelchair)?: A Little Help needed walking in hospital room?: A Little Help needed climbing 3-5 steps with a railing? : A Lot 6 Click Score: 15    End of Session Equipment Utilized During Treatment: Gait belt;Back brace Activity Tolerance: Patient limited by pain;Treatment limited secondary to medical complications (Comment) (nausea) Patient left: in bed;with call bell/phone within reach;with family/visitor present Nurse Communication: Mobility status PT Visit Diagnosis: Other abnormalities of gait and mobility (R26.89);Pain Pain - part of body:  (back )    Time: 2952-8413 PT Time Calculation (min) (ACUTE ONLY): 25 min   Charges:   PT Evaluation $PT Eval Low Complexity: 1 Procedure PT Treatments $Gait Training: 8-22 mins   PT G Codes:        Leighton Ruff, PT, DPT  Acute Rehabilitation Services  Pager: [phone]   Rudean Hitt 03/25/2017, 4:34 PM

## 2017-03-25 NOTE — Transfer of Care (Signed)
Immediate Anesthesia Transfer of Care Note  Patient: Margaret Rojas  Procedure(s) Performed: Procedure(s): LUMBAR FIVE-SACRAL ONE ANTERIOR LUMBAR INTERBODY FUSION (N/A) ABDOMINAL EXPOSURE (N/A)  Patient Location: PACU  Anesthesia Type:General  Level of Consciousness: alert , oriented, drowsy and patient cooperative  Airway & Oxygen Therapy: Patient Spontanous Breathing and Patient connected to nasal cannula oxygen  Post-op Assessment: Report given to RN, Post -op Vital signs reviewed and stable and Patient moving all extremities X 4  Post vital signs: Reviewed and stable  Last Vitals:  Vitals:   03/25/17 0714  BP: (!) 96/56  Pulse: 71  Resp: 16  Temp: 36.7 C    Last Pain:  Vitals:   03/25/17 0714  TempSrc: Oral  PainSc: 7          Complications: No apparent anesthesia complications

## 2017-03-25 NOTE — Anesthesia Preprocedure Evaluation (Addendum)
Anesthesia Evaluation  Patient identified by MRN, date of birth, ID band Patient awake    Reviewed: Allergy & Precautions, H&P , NPO status , Patient's Chart, lab work & pertinent test results  Airway Mallampati: II  TM Distance: >3 FB Neck ROM: Full    Dental no notable dental hx. (+) Teeth Intact, Dental Advisory Given   Pulmonary neg pulmonary ROS, former smoker,    Pulmonary exam normal breath sounds clear to auscultation       Cardiovascular negative cardio ROS Normal cardiovascular exam Rhythm:Regular Rate:Normal     Neuro/Psych Anxiety negative neurological ROS  negative psych ROS   GI/Hepatic negative GI ROS, Neg liver ROS,   Endo/Other  negative endocrine ROS  Renal/GU negative Renal ROS  negative genitourinary   Musculoskeletal negative musculoskeletal ROS (+) Arthritis , Osteoarthritis,    Abdominal   Peds negative pediatric ROS (+)  Hematology negative hematology ROS (+)   Anesthesia Other Findings   Reproductive/Obstetrics negative OB ROS                            Anesthesia Physical Anesthesia Plan  ASA: II  Anesthesia Plan: General   Post-op Pain Management:    Induction: Intravenous  PONV Risk Score and Plan: 4 or greater and Ondansetron, Dexamethasone, Midazolam and Diphenhydramine  Airway Management Planned: Oral ETT  Additional Equipment:   Intra-op Plan:   Post-operative Plan: Extubation in OR  Informed Consent: I have reviewed the patients History and Physical, chart, labs and discussed the procedure including the risks, benefits and alternatives for the proposed anesthesia with the patient or authorized representative who has indicated his/her understanding and acceptance.   Dental advisory given  Plan Discussed with: CRNA and Surgeon  Anesthesia Plan Comments:        Anesthesia Quick Evaluation

## 2017-03-25 NOTE — Anesthesia Preprocedure Evaluation (Signed)
Anesthesia Evaluation  Patient identified by MRN, date of birth, ID band Patient awake    Reviewed: Allergy & Precautions, H&P , NPO status , Patient's Chart, lab work & pertinent test results  History of Anesthesia Complications (+) Emergence Delirium  Airway        Dental no notable dental hx.    Pulmonary neg pulmonary ROS, former smoker,    Pulmonary exam normal        Cardiovascular negative cardio ROS       Neuro/Psych  Headaches, Anxiety Depression negative psych ROS   GI/Hepatic Neg liver ROS, GERD  ,  Endo/Other  negative endocrine ROS  Renal/GU negative Renal ROS  negative genitourinary   Musculoskeletal  (+) Arthritis , Osteoarthritis,    Abdominal   Peds  Hematology negative hematology ROS (+)   Anesthesia Other Findings   Reproductive/Obstetrics negative OB ROS                             Anesthesia Physical Anesthesia Plan  ASA: II  Anesthesia Plan: General   Post-op Pain Management:    Induction: Intravenous  PONV Risk Score and Plan:   Airway Management Planned: Oral ETT  Additional Equipment:   Intra-op Plan:   Post-operative Plan: Extubation in OR  Informed Consent: I have reviewed the patients History and Physical, chart, labs and discussed the procedure including the risks, benefits and alternatives for the proposed anesthesia with the patient or authorized representative who has indicated his/her understanding and acceptance.   Dental advisory given  Plan Discussed with: CRNA  Anesthesia Plan Comments:         Anesthesia Quick Evaluation

## 2017-03-25 NOTE — Op Note (Signed)
    OPERATIVE REPORT  DATE OF SURGERY: 03/25/2017  PATIENT: Margaret Rojas, 41 y.o. female MRN: [id number]  DOB: 1976-08-06  PRE-OPERATIVE DIAGNOSIS: Degenerative disc disease  POST-OPERATIVE DIAGNOSIS:  Same  PROCEDURE: Anterior exposure for L5-S1 disc surgery  SURGEON:  Curt Jews, M.D.  Co-surgeon for the exposure Dr. Dominica Severin cram  ANESTHESIA:  Gen.  EBL: 150 ml  Total I/O In: 2700 [I.V.:2200; IV Piggyback:500] Out: 235 [Urine:85; Blood:150]  BLOOD ADMINISTERED: None  DRAINS: None  SPECIMEN: None  COUNTS CORRECT:  YES  PLAN OF CARE: PACU   PATIENT DISPOSITION:  PACU - hemodynamically stable  PROCEDURE DETAILS: The patient was taken to the operating placed supine position where the area of the abdomen was prepped and draped in usual sterile fashion. Lateral C-arm projection reveal the level of the L5-S1 disc. Incision was made from the midline to the left transversely was carried down through the subcutaneous fat with electrocautery. Anterior rectus sheath was opened in line with the skin incision. The rectus muscle was mobilized circumferentially. The peritoneal space was entered and the left lower quadrant and the intraperitoneal contents were mobilized to the right. The left ureter was identified and was mobilized to the right. Dissection above the L5-S1 disc was continued bluntly. Middle sacral vessels were clipped with ligaclips and divided. Continue blunt dissection was used to give adequate exposure to the right and left of the L5-S1 disc. The Thompson retractor was brought onto the field and the reverse lip 150 blades were positioned to the right and left of the L5-S1 disc and malleable retractors were used for inferior and superior exposure. C-arm was brought back onto the field and a needle was placed in the L5-S1 disc and this was confirmed with fluoroscopy. The remainder the procedure will be dictated separately by Dr. Justus Memory, M.D.,  [facility] 03/25/2017 2:32 PM

## 2017-03-25 NOTE — Anesthesia Procedure Notes (Signed)
Procedure Name: Intubation Date/Time: 03/25/2017 8:59 AM Performed by: Everlean Cherry A Pre-anesthesia Checklist: Patient identified, Emergency Drugs available, Suction available and Patient being monitored Patient Re-evaluated:Patient Re-evaluated prior to inductionOxygen Delivery Method: Circle system utilized Preoxygenation: Pre-oxygenation with 100% oxygen Intubation Type: IV induction Ventilation: Mask ventilation without difficulty Laryngoscope Size: Miller and 2 Grade View: Grade I Tube type: Oral Tube size: 7.5 mm Number of attempts: 1 Airway Equipment and Method: Stylet Placement Confirmation: ETT inserted through vocal cords under direct vision,  positive ETCO2 and breath sounds checked- equal and bilateral Secured at: 23 cm Tube secured with: Tape Dental Injury: Teeth and Oropharynx as per pre-operative assessment
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
## 2017-03-25 NOTE — Anesthesia Postprocedure Evaluation (Signed)
Anesthesia Post Note  Patient: Margaret Rojas  Procedure(s) Performed: Procedure(s) (LRB): LUMBAR FIVE-SACRAL ONE ANTERIOR LUMBAR INTERBODY FUSION (N/A) ABDOMINAL EXPOSURE (N/A)     Patient location during evaluation: PACU Anesthesia Type: General Level of consciousness: awake and alert Pain management: pain level controlled Vital Signs Assessment: post-procedure vital signs reviewed and stable Respiratory status: spontaneous breathing, nonlabored ventilation and respiratory function stable Cardiovascular status: blood pressure returned to baseline and stable Postop Assessment: no signs of nausea or vomiting Anesthetic complications: no    Last Vitals:  Vitals:   03/25/17 1306 03/25/17 1314  BP:  107/62  Pulse:  76  Resp:  18  Temp: (!) 36.4 C 36.9 C    Last Pain:  Vitals:   03/25/17 1239  TempSrc:   PainSc: Asleep                 Lennon Boutwell,W. EDMOND

## 2017-03-26 ENCOUNTER — Encounter (HOSPITAL_COMMUNITY): Payer: Self-pay | Admitting: Neurosurgery

## 2017-03-26 MED ORDER — HYDROXYZINE HCL 50 MG/ML IM SOLN: 50.0000 mg | Freq: Four times a day (QID) | INTRAMUSCULAR | Status: DC | PRN

## 2017-03-26 MED ORDER — OXYCODONE HCL 5 MG PO TABS: 5.0000 mg | ORAL_TABLET | ORAL | 0 refills | Status: DC | PRN

## 2017-03-26 NOTE — Progress Notes (Signed)
Patient ID: Margaret Rojas, female   DOB: [birth year], 41 y.o.   MRN: [id number] Looks great this morning. Reports mild abdominal soreness and moderate back soreness. Abdomen soft and nontender. A dressing is intact. 3+ left posterior tibial pulse. Stable postop day 1. Plan per Dr. Saintclair Halsted

## 2017-03-26 NOTE — Progress Notes (Signed)
Physical Therapy Treatment Patient Details Name: Margaret Rojas MRN: [id number] DOB: 1976/07/21 Today's Date: 03/26/2017    History of Present Illness Pt is a 41 y/o female s/p L5-S1 ALIF. PMH includes allergies, anxiety/depression, mibrane, platar fasciitis, R knee surgery, shoulder surgery.     PT Comments    Pt progressing towards physical therapy goals. Was able to perform transfers and ambulation with gross min guard to supervision for safety. Continues to be nauseated which limited functional mobility this session. Overall is safe to return home with husband to assist, however will continue to follow and progress as able per POC until d/c.    Follow Up Recommendations  No PT follow up;Supervision/Assistance - 24 hour     Equipment Recommendations  None recommended by PT    Recommendations for Other Services       Precautions / Restrictions Precautions Precautions: Back Precaution Booklet Issued: Yes (comment) Precaution Comments: Reviewed back precautions with pt and pt's husband.  Required Braces or Orthoses: Spinal Brace Spinal Brace: Lumbar corset;Applied in sitting position Restrictions Weight Bearing Restrictions: No    Mobility  Bed Mobility Overal bed mobility: Needs Assistance Bed Mobility: Rolling;Sit to Sidelying Rolling: Modified independent (Device/Increase time)       Sit to sidelying: Supervision General bed mobility comments: Gross supervision for safety. Verbal cues for use of proper log roll technique.   Transfers Overall transfer level: Needs assistance Equipment used: None Transfers: Sit to/from Stand Sit to Stand: Supervision         General transfer comment: Pt was able to complete without assist. No unsteadiness/LOB noted.   Ambulation/Gait Ambulation/Gait assistance: Min guard Ambulation Distance (Feet): 75 Feet Assistive device: 1 person hand held assist (Husband) Gait Pattern/deviations: Step-through pattern;Decreased  stride length Gait velocity: Decreased Gait velocity interpretation: Below normal speed for age/gender General Gait Details: Slow, guarded gait secondry to post op pain and weakness. Pt holding husband's arm at times for support and walking without assist at other times. Appeared weak and reports nausea. Pt was already ambulating in hall upon PT arrival to unit.    Stairs Stairs: Yes   Stair Management: Two rails;No rails;Forwards Number of Stairs: 10 General stair comments: VC's for safe negotiation of stairs. Pt first with rails and then with husband's assist. Husband present for education throughout.   Wheelchair Mobility    Modified Rankin (Stroke Patients Only)       Balance Overall balance assessment: Needs assistance Sitting-balance support: No upper extremity supported;Feet supported Sitting balance-Leahy Scale: Good     Standing balance support: Bilateral upper extremity supported;During functional activity Standing balance-Leahy Scale: Fair Standing balance comment: Able to maintain static standing without use of UE                             Cognition Arousal/Alertness: Awake/alert Behavior During Therapy: WFL for tasks assessed/performed Overall Cognitive Status: Within Functional Limits for tasks assessed                                        Exercises      General Comments        Pertinent Vitals/Pain Pain Assessment: Faces Faces Pain Scale: Hurts even more Pain Location: back  Pain Descriptors / Indicators: Aching;Operative site guarding;Sore;Grimacing Pain Intervention(s): Limited activity within patient's tolerance;Monitored during session;Repositioned    Home Living Family/patient expects to  be discharged to:: Private residence Living Arrangements: Spouse/significant other Available Help at Discharge: Family;Available 24 hours/day Type of Home: House Home Access: Stairs to enter Entrance Stairs-Rails: None Home  Layout: One level Home Equipment: Shower seat;Other (comment) (adjustable bed. )      Prior Function Level of Independence: Independent          PT Goals (current goals can now be found in the care plan section) Acute Rehab PT Goals Patient Stated Goal: to go home  PT Goal Formulation: With patient Time For Goal Achievement: 04/01/17 Potential to Achieve Goals: Good Progress towards PT goals: Progressing toward goals    Frequency    Min 5X/week      PT Plan Current plan remains appropriate    Co-evaluation              AM-PAC PT "6 Clicks" Daily Activity  Outcome Measure  Difficulty turning over in bed (including adjusting bedclothes, sheets and blankets)?: A Little Difficulty moving from lying on back to sitting on the side of the bed? : A Little Difficulty sitting down on and standing up from a chair with arms (e.g., wheelchair, bedside commode, etc,.)?: Total Help needed moving to and from a bed to chair (including a wheelchair)?: A Little Help needed walking in hospital room?: A Little Help needed climbing 3-5 steps with a railing? : A Lot 6 Click Score: 15    End of Session Equipment Utilized During Treatment: Gait belt;Back brace Activity Tolerance: Patient limited by pain;Treatment limited secondary to medical complications (Comment) (nausea) Patient left: in bed;with call bell/phone within reach;with family/visitor present Nurse Communication: Mobility status PT Visit Diagnosis: Other abnormalities of gait and mobility (R26.89);Pain Pain - part of body:  (back )     Time: 7972-8206 PT Time Calculation (min) (ACUTE ONLY): 29 min  Charges:  $Gait Training: 23-37 mins                    G Codes:       Rolinda Roan, PT, DPT Acute Rehabilitation Services Pager: [place] 03/26/2017, 11:13 AM

## 2017-03-26 NOTE — Discharge Summary (Signed)
  Physician Discharge Summary  Patient ID: RAELLE CHAMBERS MRN: [id number] DOB/AGE: Apr 26, 1976 41 y.o.  Admit date: 03/25/2017 Discharge date: 03/26/2017  Admission Diagnoses:Degenerative disc disease L5-S1  Discharge Diagnoses: Same  Active Problems:   DDD (degenerative disc disease), lumbosacral   Discharged Condition: good  Hospital Course: Patient admitted hospital underwent anterior lumbar interbody fusion postoperatively patient did very well was angling and voiding spontaneous he tolerating regular diet stable for discharge home. Patient will be discharged her scheduled follow-up in one to 2 weeks.  Consults: Significant Diagnostic Studies: Treatments: Anterior lumbar interbody fusion L5-S1 Discharge Exam: Blood pressure (!) 98/59, pulse 87, temperature (!) 97.5 F (36.4 C), temperature source Oral, resp. rate 18, height 5' 8.5" (1.74 m), weight 88.9 kg (196 lb), SpO2 100 %. Strength out of 5 wound clean dry and intact  Disposition: Home   Allergies as of 03/26/2017      Reactions   Pneumovax [pneumococcal Polysaccharide Vaccine] Swelling   Local Reaction Injection site reaction-red and swollen    Sulfonamide Derivatives Hives      Medication List    TAKE these medications   Adapalene-Benzoyl Peroxide 0.1-2.5 % gel Commonly known as:  EPIDUO Apply once daily as needed What changed:  how much to take  how to take this  when to take this  additional instructions   ALPRAZolam 0.5 MG tablet Commonly known as:  XANAX Take 1 tablet (0.5 mg total) by mouth daily as needed. for anxiety   CHILDRENS GUMMIES Chew Chew 2 each by mouth daily.   EMERGEN-C VITAMIN C Pack Take 1 packet by mouth daily.   MIRENA (52 MG) 20 MCG/24HR IUD Generic drug:  levonorgestrel 1 application by Intrauterine route.   oxyCODONE 5 MG immediate release tablet Commonly known as:  Oxy IR/ROXICODONE Take 1-2 tablets (5-10 mg total) by mouth every 3 (three) hours as needed for  breakthrough pain.   sertraline 50 MG tablet Commonly known as:  ZOLOFT Take 1.5 tablets (75 mg total) by mouth daily.   tiZANidine 2 MG tablet Commonly known as:  ZANAFLEX Take 1-4 mg by mouth every 8 hours when necessary pain and spasm   topiramate 100 MG tablet Commonly known as:  TOPAMAX Take 100 mg by mouth daily.   traMADol 50 MG tablet Commonly known as:  ULTRAM Take 1 tablet (50 mg total) by mouth every 8 (eight) hours as needed for moderate pain.   zolpidem 10 MG tablet Commonly known as:  AMBIEN Take 1 tablet (10 mg total) by mouth at bedtime as needed. for sleep        Signed: Tami Barren P 03/26/2017, 9:32 AM

## 2017-03-26 NOTE — Progress Notes (Signed)
Occupational Therapy Evaluation Patient Details Name: Margaret Rojas MRN: [id number] DOB: 17-Jun-1976 Today's Date: 03/26/2017    History of Present Illness Pt is a 41 y/o female s/p L5-S1 ALIF. PMH includes allergies, anxiety/depression, mibrane, platar fasciitis, R knee surgery, shoulder surgery.    Clinical Impression   Completed all education regarding compensatory techniques for ADL, use of AE as needed and DME for functional transfers. Pt/family verbalized understanding of information. Pt safe to DC home when medically stable.     Follow Up Recommendations  No OT follow up;Supervision - Intermittent    Equipment Recommendations  3 in 1 bedside commode    Recommendations for Other Services       Precautions / Restrictions Precautions Precautions: Back Precaution Booklet Issued: Yes (comment) Precaution Comments: Reviewed back precautions with pt and pt's husband.  Required Braces or Orthoses: Spinal Brace Spinal Brace: Lumbar corset;Applied in sitting position Restrictions Weight Bearing Restrictions: No      Mobility Bed Mobility Overal bed mobility: Needs Assistance Bed Mobility: Rolling;Sit to Sidelying Rolling: Modified independent (Device/Increase time) Sidelying to sit: Supervision     Sit to sidelying: Supervision General bed mobility comments: Gross supervision for safety. Verbal cues for use of proper log roll technique.   Transfers Overall transfer level: Needs assistance Equipment used: None Transfers: Sit to/from Stand Sit to Stand: Supervision         General transfer comment: Able to return demonstrate    Balance Overall balance assessment: Needs assistance Sitting-balance support: No upper extremity supported;Feet supported Sitting balance-Leahy Scale: Good     Standing balance support: Bilateral upper extremity supported;During functional activity Standing balance-Leahy Scale: Fair Standing balance comment: Able to maintain  static standing without use of UE                            ADL either performed or assessed with clinical judgement   ADL Overall ADL's : Needs assistance/impaired                                     Functional mobility during ADLs: Supervision/safety General ADL Comments: completed educatiion regarding compensatory techniques for ADL and functional mobility. Educated on availability of AE for ADL. Educated on modifications of environemtn to increase independence with IADL tasks that pt was concerned about. REcommend pt use 3in1 as shower chair. Educated on transfer techqnieus. Pt/familyi verbalized understanding.     Vision         Perception     Praxis      Pertinent Vitals/Pain Pain Assessment: 0-10 Pain Score: 7  Faces Pain Scale: Hurts even more Pain Location: back  Pain Descriptors / Indicators: Aching;Operative site guarding;Sore;Grimacing Pain Intervention(s): Limited activity within patient's tolerance     Hand Dominance     Extremity/Trunk Assessment Upper Extremity Assessment Upper Extremity Assessment: Overall WFL for tasks assessed   Lower Extremity Assessment Lower Extremity Assessment: Defer to PT evaluation   Cervical / Trunk Assessment Cervical / Trunk Assessment: Other exceptions Cervical / Trunk Exceptions: s/p back surgery    Communication Communication Communication: No difficulties   Cognition Arousal/Alertness: Awake/alert Behavior During Therapy: Anxious Overall Cognitive Status: Within Functional Limits for tasks assessed  General Comments       Exercises     Shoulder Instructions      Home Living Family/patient expects to be discharged to:: Private residence Living Arrangements: Spouse/significant other Available Help at Discharge: Family;Available 24 hours/day Type of Home: House Home Access: Stairs to enter [employer] of Steps:  2 Entrance Stairs-Rails: None Home Layout: One level     Bathroom Shower/Tub: Tub/shower unit;Door   [employer] Toilet: Standard Bathroom Accessibility: Yes How Accessible: Accessible via walker Home Equipment: Shower seat;Other (comment) (adjustable bed. )          Prior Functioning/Environment Level of Independence: Independent                 OT Problem List:        OT Treatment/Interventions:      OT Goals(Current goals can be found in the care plan section) Acute Rehab OT Goals Patient Stated Goal: to go home  OT Goal Formulation: All assessment and education complete, DC therapy  OT Frequency:     Barriers to D/C:            Co-evaluation              AM-PAC PT "6 Clicks" Daily Activity     Outcome Measure Help from another person eating meals?: None Help from another person taking care of personal grooming?: None Help from another person toileting, which includes using toliet, bedpan, or urinal?: A Little Help from another person bathing (including washing, rinsing, drying)?: A Little Help from another person to put on and taking off regular upper body clothing?: None Help from another person to put on and taking off regular lower body clothing?: A Little 6 Click Score: 21   End of Session Equipment Utilized During Treatment: Back brace Nurse Communication: Mobility status  Activity Tolerance: Patient tolerated treatment well Patient left: in bed;with call bell/phone within reach;with family/visitor present  OT Visit Diagnosis: Muscle weakness (generalized) (M62.81);Pain Pain - part of body:  (back)                Time: 4128-7867 OT Time Calculation (min): 35 min Charges:  OT General Charges $OT Visit: 1 Procedure OT Evaluation $OT Eval Low Complexity: 1 Procedure OT Treatments $Self Care/Home Management : 8-22 mins G-Codes:     [facility], OT/L  [phone] 03/26/2017  Analyn Matusek,HILLARY 03/26/2017, 12:37 PM

## 2017-03-26 NOTE — Discharge Instructions (Signed)

## 2017-04-08 ENCOUNTER — Telehealth: Payer: Self-pay

## 2017-04-08 ENCOUNTER — Other Ambulatory Visit: Payer: Self-pay | Admitting: Family Medicine

## 2017-04-08 MED ORDER — SERTRALINE HCL 100 MG PO TABS: 100.0000 mg | ORAL_TABLET | Freq: Every day | ORAL | 11 refills | Status: DC

## 2017-04-08 MED ORDER — ALPRAZOLAM 1 MG PO TABS
1.0000 mg | ORAL_TABLET | Freq: Every evening | ORAL | 1 refills | Status: DC | PRN
Start: 2017-04-08 — End: 2017-05-20

## 2017-04-08 NOTE — Telephone Encounter (Signed)
Pt notified of Dr. Marliss Coots comments and verbalized understanding. Pt isn't using the ambien right now because she just had major back surgery and she is afraid that it will make her to sedated and she will move the wrong way while sleeping or drowsy and re-injure her back. Pt said that the 0.5 mg xanax isn't helping her sleep so she does want to try taking the 1mg  tablet for a short amount of time until her back heals enough and she is off of bedrest and is able to see her psychologist again, pt uses [facility] [place] on file, please send in the new xanax Rx and pt will update [place] in a few weeks on if the new dose of zoloft is helping or not

## 2017-04-08 NOTE — Telephone Encounter (Signed)
Px written for call in   Thanks  

## 2017-04-08 NOTE — Telephone Encounter (Signed)
I wrote for zoloft 100 mg to call in if she wants to Give it 1-2 wk and let me know how it is going  We can increase it more if needed  Is she using ambien?  We can increase xanax to 1 mg short term for sleep but I know she does not want to

## 2017-04-08 NOTE — Telephone Encounter (Signed)
Pt left /vm; pt was seen 03/06/17; pt has had the spinal fusion and cannot be in a car for another 4 wks. Pt having problems sleeping and anxiety. Pt is presently taking zoloft 75 mg. Pt wants to know if can increase the zoloft or does pt need to change to different med. Also it is 1:30 - 2AM before pt can go to sleep and that is after taking 1/2 of Xanax and pt does not want to continue taking xanax to help her sleep. Pt request cb.

## 2017-04-09 NOTE — Telephone Encounter (Signed)
Xanax Rx called in to requested pharmacy

## 2017-04-17 ENCOUNTER — Other Ambulatory Visit: Payer: Self-pay | Admitting: Obstetrics and Gynecology

## 2017-04-17 DIAGNOSIS — N6320 Unspecified lump in the left breast, unspecified quadrant: Secondary | ICD-10-CM

## 2017-05-13 ENCOUNTER — Ambulatory Visit
Admission: RE | Admit: 2017-05-13 | Discharge: 2017-05-13 | Disposition: A | Discharge status: Home / Self Care | Payer: 59 | Source: Ambulatory Visit | Attending: Obstetrics and Gynecology | Admitting: Obstetrics and Gynecology

## 2017-05-13 DIAGNOSIS — N6320 Unspecified lump in the left breast, unspecified quadrant: Secondary | ICD-10-CM

## 2017-05-15 ENCOUNTER — Other Ambulatory Visit: Payer: Self-pay | Admitting: Obstetrics and Gynecology

## 2017-05-15 ENCOUNTER — Telehealth: Payer: Self-pay | Admitting: Hematology and Oncology

## 2017-05-15 DIAGNOSIS — N63 Unspecified lump in unspecified breast: Secondary | ICD-10-CM

## 2017-05-15 NOTE — Telephone Encounter (Signed)
confirmed with patient upcoming Breast Clinic appointment for 05/20/17. 05/15/17

## 2017-05-16 DIAGNOSIS — Z1379 Encounter for other screening for genetic and chromosomal anomalies: Secondary | ICD-10-CM

## 2017-05-16 HISTORY — DX: Encounter for other screening for genetic and chromosomal anomalies: Z13.79

## 2017-05-19 ENCOUNTER — Other Ambulatory Visit: Payer: Self-pay | Admitting: *Deleted

## 2017-05-19 ENCOUNTER — Other Ambulatory Visit: Payer: 59

## 2017-05-19 DIAGNOSIS — C50412 Malignant neoplasm of upper-outer quadrant of left female breast: Secondary | ICD-10-CM | POA: Insufficient documentation

## 2017-05-19 DIAGNOSIS — Z170 Estrogen receptor positive status [ER+]: Principal | ICD-10-CM

## 2017-05-20 ENCOUNTER — Encounter: Payer: Self-pay | Admitting: Physical Therapy

## 2017-05-20 ENCOUNTER — Other Ambulatory Visit (HOSPITAL_BASED_OUTPATIENT_CLINIC_OR_DEPARTMENT_OTHER): Payer: 59

## 2017-05-20 ENCOUNTER — Ambulatory Visit: Payer: 59 | Attending: General Surgery | Admitting: Physical Therapy

## 2017-05-20 ENCOUNTER — Ambulatory Visit (HOSPITAL_BASED_OUTPATIENT_CLINIC_OR_DEPARTMENT_OTHER): Payer: 59 | Admitting: Hematology and Oncology

## 2017-05-20 ENCOUNTER — Encounter: Payer: Self-pay | Admitting: General Practice

## 2017-05-20 ENCOUNTER — Encounter: Payer: Self-pay | Admitting: Hematology and Oncology

## 2017-05-20 ENCOUNTER — Ambulatory Visit
Admission: RE | Admit: 2017-05-20 | Discharge: 2017-05-20 | Disposition: A | Discharge status: Home / Self Care | Payer: 59 | Source: Ambulatory Visit | Attending: Radiation Oncology | Admitting: Radiation Oncology

## 2017-05-20 ENCOUNTER — Other Ambulatory Visit: Payer: Self-pay | Admitting: General Surgery

## 2017-05-20 DIAGNOSIS — Z170 Estrogen receptor positive status [ER+]: Secondary | ICD-10-CM | POA: Insufficient documentation

## 2017-05-20 DIAGNOSIS — C50412 Malignant neoplasm of upper-outer quadrant of left female breast: Secondary | ICD-10-CM | POA: Insufficient documentation

## 2017-05-20 DIAGNOSIS — Z801 Family history of malignant neoplasm of trachea, bronchus and lung: Secondary | ICD-10-CM | POA: Insufficient documentation

## 2017-05-20 DIAGNOSIS — Z8051 Family history of malignant neoplasm of kidney: Secondary | ICD-10-CM | POA: Insufficient documentation

## 2017-05-20 DIAGNOSIS — R293 Abnormal posture: Secondary | ICD-10-CM | POA: Insufficient documentation

## 2017-05-20 DIAGNOSIS — K219 Gastro-esophageal reflux disease without esophagitis: Secondary | ICD-10-CM | POA: Insufficient documentation

## 2017-05-20 DIAGNOSIS — Z800 Family history of malignant neoplasm of digestive organs: Secondary | ICD-10-CM | POA: Insufficient documentation

## 2017-05-20 DIAGNOSIS — F419 Anxiety disorder, unspecified: Secondary | ICD-10-CM | POA: Insufficient documentation

## 2017-05-20 DIAGNOSIS — D0512 Intraductal carcinoma in situ of left breast: Secondary | ICD-10-CM | POA: Insufficient documentation

## 2017-05-20 DIAGNOSIS — Z510 Encounter for antineoplastic radiation therapy: Secondary | ICD-10-CM | POA: Insufficient documentation

## 2017-05-20 DIAGNOSIS — F329 Major depressive disorder, single episode, unspecified: Secondary | ICD-10-CM | POA: Insufficient documentation

## 2017-05-20 DIAGNOSIS — Z87440 Personal history of urinary (tract) infections: Secondary | ICD-10-CM | POA: Insufficient documentation

## 2017-05-20 DIAGNOSIS — Z8249 Family history of ischemic heart disease and other diseases of the circulatory system: Secondary | ICD-10-CM | POA: Insufficient documentation

## 2017-05-20 DIAGNOSIS — Z882 Allergy status to sulfonamides status: Secondary | ICD-10-CM | POA: Insufficient documentation

## 2017-05-20 LAB — COMPREHENSIVE METABOLIC PANEL
ALBUMIN: 3.9 g/dL (ref 3.5–5.0)
ALK PHOS: 62 U/L (ref 40–150)
ALT: 12 U/L (ref 0–55)
ANION GAP: 9 meq/L (ref 3–11)
AST: 15 U/L (ref 5–34)
BUN: 13.7 mg/dL (ref 7.0–26.0)
CALCIUM: 9.4 mg/dL (ref 8.4–10.4)
CO2: 23 mEq/L (ref 22–29)
Chloride: 108 mEq/L (ref 98–109)
Creatinine: 1 mg/dL (ref 0.6–1.1)
EGFR: 69 mL/min/{1.73_m2} — AB (ref >90)
GLUCOSE: 123 mg/dL (ref 70–140)
POTASSIUM: 3.5 meq/L (ref 3.5–5.1)
SODIUM: 140 meq/L (ref 136–145)
Total Bilirubin: 0.48 mg/dL (ref 0.20–1.20)
Total Protein: 7.5 g/dL (ref 6.4–8.3)

## 2017-05-20 LAB — CBC WITH DIFFERENTIAL/PLATELET
BASO%: 0.3 % (ref 0.0–2.0)
Basophils Absolute: 0 10*3/uL (ref 0.0–0.1)
EOS%: 0.9 % (ref 0.0–7.0)
Eosinophils Absolute: 0.1 10*3/uL (ref 0.0–0.5)
HCT: 42.9 % (ref 34.8–46.6)
HGB: 14.1 g/dL (ref 11.6–15.9)
LYMPH%: 33.8 % (ref 14.0–49.7)
MCH: 30.4 pg (ref 25.1–34.0)
MCHC: 32.9 g/dL (ref 31.5–36.0)
MCV: 92.5 fL (ref 79.5–101.0)
MONO#: 0.5 10*3/uL (ref 0.1–0.9)
MONO%: 7.4 % (ref 0.0–14.0)
NEUT#: 4 10*3/uL (ref 1.5–6.5)
NEUT%: 57.6 % (ref 38.4–76.8)
PLATELETS: 260 10*3/uL (ref 145–400)
RBC: 4.64 10*6/uL (ref 3.70–5.45)
RDW: 13.4 % (ref 11.2–14.5)
WBC: 7 10*3/uL (ref 3.9–10.3)
lymph#: 2.4 10*3/uL (ref 0.9–3.3)

## 2017-05-20 MED ORDER — TAMOXIFEN CITRATE 20 MG PO TABS: 20.0000 mg | ORAL_TABLET | Freq: Every day | ORAL | 0 refills | Status: DC

## 2017-05-20 NOTE — Assessment & Plan Note (Signed)
05/13/2017: Palpable left breast mass with distortion at 1:30 position: 1.1 cm with left axillary lymph node, 3.5 mm cortex, biopsy tubular 05/21/2017. 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 After reviewing the pathology in detail, we proceeded to discuss the different treatment options between surgery, radiation, chemotherapy, antiestrogen therapies.  Recommendations: Genetic counseling to be done tomorrow 1. Breast conserving surgery followed by 2. Oncotype DX testing to determine if chemotherapy would be of any benefit followed by 3. Adjuvant radiation therapy followed by 4. Adjuvant antiestrogen therapy  Oncotype counseling: I discussed Oncotype DX test. I explained to the patient that this is a 21 gene panel to evaluate patient tumors DNA to calculate recurrence score. This would help determine whether patient has high risk or intermediate risk or low risk breast cancer. She understands that if her tumor was found to be high risk, she would benefit from systemic chemotherapy. If low risk, no need of chemotherapy. If she was found to be intermediate risk, we would need to evaluate the score as well as other risk factors and determine if an abbreviated chemotherapy may be of benefit.  Return to clinic after surgery to discuss final pathology report and then determine if Oncotype DX testing will need to be sent.

## 2017-05-20 NOTE — Progress Notes (Signed)
Nutrition Assessment  Reason for Assessment:  Pt seen in Breast Clinic  ASSESSMENT:   41 year old female with new diagnosis of breast cancer.  Past medical history of recent degenerative disc surgery (L-5 S-1), endometriosis   Patient reports decreased appetite since surgery and with new diagnosis. Reports some nausea but still trying to get in good sources of protein.  No weight loss from recent decreased appetite.  Medications:  reviewed  Labs: reviewed  Anthropometrics:   Height: 68.5 inches Weight: 196 lb BMI: 29   NUTRITION DIAGNOSIS: Food and nutrition related knowledge deficit related to new diagnosis of breast cancer as evidenced by no prior need for nutrition related information.  INTERVENTION:   Discussed and provided packet of information regarding nutritional tips for breast cancer patients.  Questions answered.  Teachback method used.  Contact information provided and patient knows to contact me with questions/concerns.    MONITORING, EVALUATION, and GOAL: Pt will consume a healthy plant based diet to maintain lean body mass throughout treatment.   Margaret Rojas, [place], [place] Registered Dietitian [phone] (pager)
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
## 2017-05-20 NOTE — Progress Notes (Signed)
[facility] Psychosocial Distress Screening Spiritual Care  Met with Margaret Rojas and husband [name] in [facility] to introduce [name], reviewing distress screen per protocol.  The patient scored a 8 on the Psychosocial Distress Thermometer which indicates severe distress. Also assessed for distress and other psychosocial needs.   ONCBCN DISTRESS SCREENING 05/20/2017  Screening Type Initial Screening  Distress experienced in past week (1-10) 8  Family Problem type Children  Emotional problem type Nervousness/Anxiety;Adjusting to illness;Isolation/feeling alone;Boredom  Spiritual/Religous concerns type Loss of Faith;Facing my mortality  Information Concerns Type Lack of info about diagnosis;Lack of info about treatment  Physical Problem type Pain;Nausea/vomiting;Sleep/insomnia;Loss of appetitie  Referral to support programs Yes  Other referral for [name] (mentor)    [name] shared candidly about distress from two foot surgeries, the back surgery she is still healing from, and now a cancer dx.  Addressing the loss of identity, meaning-making, and sense of contribution due to physical restriction (and thus being out of work and having limited ability to perform many daily acts of parenting), [name] said she feels "useless."  Couple reports great support from friends through her various surgeries.  We talked in detail about their other top concern, how to address dx/tx with their children (6, 10).  Family is already familiar with [facility], because dtr (44) received support there years ago after her friend died, and would be glad to utilize it again.  Couple also knows to reach out to Support Team for further assistance.   Follow up needed: Yes.  We plan for me to f/u by phone on Friday to give [name] opportunity to process and reflect on [name] and where she is after learning the plan.  Further calls/appts may follow for continued emotional support. [name] referral, as well, per request.  Both may help with her boredom and social isolation.   [place], [place], [facility] Pager [phone] Voicemail [phone]

## 2017-05-20 NOTE — Patient Instructions (Signed)
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
## 2017-05-21 ENCOUNTER — Other Ambulatory Visit: Payer: 59

## 2017-05-21 ENCOUNTER — Ambulatory Visit
Admission: RE | Admit: 2017-05-21 | Discharge: 2017-05-21 | Disposition: A | Discharge status: Home / Self Care | Payer: 59 | Source: Ambulatory Visit | Attending: Obstetrics and Gynecology | Admitting: Obstetrics and Gynecology

## 2017-05-21 ENCOUNTER — Encounter: Payer: Self-pay | Admitting: Genetic Counselor

## 2017-05-21 ENCOUNTER — Ambulatory Visit (HOSPITAL_BASED_OUTPATIENT_CLINIC_OR_DEPARTMENT_OTHER): Payer: 59 | Admitting: Genetic Counselor

## 2017-05-21 DIAGNOSIS — C50412 Malignant neoplasm of upper-outer quadrant of left female breast: Secondary | ICD-10-CM

## 2017-05-21 DIAGNOSIS — Z8051 Family history of malignant neoplasm of kidney: Secondary | ICD-10-CM | POA: Diagnosis not present

## 2017-05-21 DIAGNOSIS — Z803 Family history of malignant neoplasm of breast: Secondary | ICD-10-CM | POA: Diagnosis not present

## 2017-05-21 DIAGNOSIS — N63 Unspecified lump in unspecified breast: Secondary | ICD-10-CM

## 2017-05-21 DIAGNOSIS — Z170 Estrogen receptor positive status [ER+]: Secondary | ICD-10-CM | POA: Diagnosis not present

## 2017-05-21 DIAGNOSIS — Z800 Family history of malignant neoplasm of digestive organs: Secondary | ICD-10-CM

## 2017-05-21 DIAGNOSIS — Z808 Family history of malignant neoplasm of other organs or systems: Secondary | ICD-10-CM | POA: Diagnosis not present
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
## 2017-05-22 ENCOUNTER — Encounter: Payer: Self-pay | Admitting: General Practice

## 2017-05-22 NOTE — Progress Notes (Signed)
[facility] Spiritual Care Note  Margaret Rojas by phone for f/u support as planned.  She used the opportunity well to share and process how she is coping and helping her children understand.    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  Mailed a handwritten note of encouragement and plan to f/u ca weekly per her request.   [name], [place], [facility] Pager [phone] Voicemail [phone]

## 2017-05-25 ENCOUNTER — Other Ambulatory Visit: Payer: Self-pay | Admitting: General Surgery

## 2017-05-25 DIAGNOSIS — Z170 Estrogen receptor positive status [ER+]: Principal | ICD-10-CM

## 2017-05-25 DIAGNOSIS — C50412 Malignant neoplasm of upper-outer quadrant of left female breast: Secondary | ICD-10-CM

## 2017-05-26 ENCOUNTER — Telehealth: Payer: Self-pay | Admitting: Genetic Counselor

## 2017-05-26 ENCOUNTER — Telehealth: Payer: Self-pay | Admitting: *Deleted

## 2017-05-26 NOTE — Telephone Encounter (Signed)
Spoke to pt regarding [place] from 05/20/17. Denies questions or concerns regarding dx or treatment care plan. Scheduled and confirmed post-op appt with Dr. Lindi Adie on 06/16/17 at 1115. Encourage pt to call with needs. Received verbal understanding.

## 2017-05-26 NOTE — Telephone Encounter (Signed)
Revealed negative results on the 9 gene STAT test.  We have re-requisitioned to the 46 gene common hereditary cancer panel.  We will call when this is completed.  I shared the results with Dr. Donne Hazel online, so that he can have a copy of these as well.

## 2017-05-28 ENCOUNTER — Encounter: Payer: Self-pay | Admitting: Genetic Counselor

## 2017-05-28 ENCOUNTER — Telehealth: Payer: Self-pay | Admitting: Genetic Counselor

## 2017-05-28 ENCOUNTER — Encounter: Payer: Self-pay | Admitting: General Practice

## 2017-05-28 ENCOUNTER — Ambulatory Visit: Payer: Self-pay | Admitting: Genetic Counselor

## 2017-05-28 DIAGNOSIS — C50412 Malignant neoplasm of upper-outer quadrant of left female breast: Secondary | ICD-10-CM

## 2017-05-28 DIAGNOSIS — Z8051 Family history of malignant neoplasm of kidney: Secondary | ICD-10-CM

## 2017-05-28 DIAGNOSIS — Z808 Family history of malignant neoplasm of other organs or systems: Secondary | ICD-10-CM

## 2017-05-28 DIAGNOSIS — Z803 Family history of malignant neoplasm of breast: Secondary | ICD-10-CM

## 2017-05-28 DIAGNOSIS — Z1379 Encounter for other screening for genetic and chromosomal anomalies: Secondary | ICD-10-CM

## 2017-05-28 DIAGNOSIS — Z170 Estrogen receptor positive status [ER+]: Secondary | ICD-10-CM

## 2017-05-28 DIAGNOSIS — Z800 Family history of malignant neoplasm of digestive organs: Secondary | ICD-10-CM

## 2017-05-28 NOTE — Progress Notes (Signed)
[facility] Spiritual Care Note  LVM of encouragement.  Continuing to follow for support.  [name] also knows to contact Support Team whenever desired.   [place], [place], [facility] Pager [phone] Voicemail [phone]

## 2017-05-28 NOTE — Progress Notes (Signed)
HPI: Margaret Rojas was previously seen in the [place] clinic due to a personal and family history of cancer and concerns regarding a hereditary predisposition to cancer. Please refer to our prior cancer genetics clinic note for more information regarding Margaret Rojas medical, social and family histories, and our assessment and recommendations, at the time. Margaret Rojas recent genetic test results were disclosed to her, as were recommendations warranted by these results. These results and recommendations are discussed in more detail below.  CANCER HISTORY:    Malignant neoplasm of upper-outer quadrant of left breast in female, estrogen receptor positive ([place])   05/13/2017 Initial Diagnosis    Palpable left breast mass with distortion at 1:30 position: 1.1 cm with left axillary lymph node, 3.5 mm cortex, biopsy tubular 05/21/2017. Biopsy of the breast mass revealed grade 1-2 IDC with DCIS ER 95%, PR 95%, Ki-67 20%, HER-2 negative ratio 1.32 T1c N0 stage IA      05/26/2017 Genetic Testing    Negative genetic testing on the 9-gene STAT panel.The STAT Breast cancer panel offered by [facility] includes sequencing and rearrangement analysis for the following 9 genes:  ATM, BRCA1, BRCA2, CDH1, CHEK2, PALB2, PTEN, STK11 and TP53.   The report date is 05/26/2017.  Negative genetic testing on the reflexed common hereditary cancer panel.  The Hereditary Gene Panel offered by [facility] includes sequencing and/or deletion duplication testing of the following 46 genes: APC, ATM, AXIN2, BARD1, BMPR1A, BRCA1, BRCA2, BRIP1, CDH1, CDKN2A (p14ARF), CDKN2A (p16INK4a), CHEK2, CTNNA1, DICER1, EPCAM (Deletion/duplication testing only), GREM1 (promoter region deletion/duplication testing only), KIT, MEN1, MLH1, MSH2, MSH3, MSH6, MUTYH, NBN, NF1, NHTL1, PALB2, PDGFRA, PMS2, POLD1, POLE, PTEN, RAD50, RAD51C, RAD51D, SDHB, SDHC, SDHD, SMAD4, SMARCA4. STK11, TP53, TSC1, TSC2, and VHL.  The following genes were  evaluated for sequence changes only: SDHA and HOXB13 c.251G>A variant only.  The report date is May 26, 2017.       FAMILY HISTORY:  We obtained a detailed, 4-generation family history.  Significant diagnoses are listed below: Family History  Problem Relation Age of Onset  . Hyperlipidemia Mother   . Skin cancer Mother   . Hyperlipidemia Father   . Melanoma Father 64       on back  . Stroke Maternal Grandmother   . Colon cancer Maternal Grandmother        dx in her 44s  . Head & neck cancer Maternal Grandmother        cancer of the jaw  . Stroke Maternal Grandfather   . Heart disease Maternal Grandfather   . COPD Paternal Grandmother   . Kidney cancer Paternal Uncle 87  . Colon cancer Maternal Uncle 23  . Breast cancer Cousin        MGF's sister, dx in her 80s-60s    The patient has two children, a son and daughter, who are cancer free.  She has a full brother and sister and a paternal half brother, who are all cancer free, as are their children.  Both parents are living.  The patient's father was diagnosed with melanoma at age 25.  He had two brothers, one who died of kidney cancer at 96.  The paternal grandparents are deceased.  The grandmother died of COPD and the grandfather died of natural causes.    The patients' mother has had Ramirez-Perez removed, but has no other cancer history.  She had one brother who developed colon cancer at 65 and died at 73.  Both maternal grandparents are deceased.  The grandmother had colon cancer in her 80's and jaw cancer in her 42's.  The grandfather died of strokes and heart disease.  The grandfather had a sister who died of breast cancer in her 17's-60's.  Margaret Rojas is unaware of previous family history of genetic testing for hereditary cancer risks. Patient's maternal ancestors are of [place] descent, and paternal ancestors are of [place] and [place] descent. There is no reported Ashkenazi Jewish ancestry. There is no known  consanguinity.  GENETIC TEST RESULTS: Genetic testing reported out on May 26, 2017 through the common hereditary cancer panel found no deleterious mutations.  The Hereditary Gene Panel offered by [facility] includes sequencing and/or deletion duplication testing of the following 46 genes: APC, ATM, AXIN2, BARD1, BMPR1A, BRCA1, BRCA2, BRIP1, CDH1, CDKN2A (p14ARF), CDKN2A (p16INK4a), CHEK2, CTNNA1, DICER1, EPCAM (Deletion/duplication testing only), GREM1 (promoter region deletion/duplication testing only), KIT, MEN1, MLH1, MSH2, MSH3, MSH6, MUTYH, NBN, NF1, NHTL1, PALB2, PDGFRA, PMS2, POLD1, POLE, PTEN, RAD50, RAD51C, RAD51D, SDHB, SDHC, SDHD, SMAD4, SMARCA4. STK11, TP53, TSC1, TSC2, and VHL.  The following genes were evaluated for sequence changes only: SDHA and HOXB13 c.251G>A variant only.  The test report has been scanned into EPIC and is located under the Molecular Pathology section of the Results Review tab.   We discussed with Margaret Rojas that since the current genetic testing is not perfect, it is possible there may be a gene mutation in one of these genes that current testing cannot detect, but that chance is small. We also discussed, that it is possible that another gene that has not yet been discovered, or that we have not yet tested, is responsible for the cancer diagnoses in the family, and it is, therefore, important to remain in touch with cancer genetics in the future so that we can continue to offer Margaret Rojas the most up to date genetic testing.      CANCER SCREENING RECOMMENDATIONS: This result is reassuring and indicates that Margaret Rojas likely does not have an increased risk for a future cancer due to a mutation in one of these genes. This normal test also suggests that Margaret Rojas cancer was most likely not due to an inherited predisposition associated with one of these genes.  Most cancers happen by chance and this negative test suggests that her cancer falls  into this category.  We, therefore, recommended she continue to follow the cancer management and screening guidelines provided by her oncology and primary healthcare provider.   RECOMMENDATIONS FOR FAMILY MEMBERS: Women in this family might be at some increased risk of developing cancer, over the general population risk, simply due to the family history of cancer. We recommended women in this family have a yearly mammogram beginning at age 4, or 73 years younger than the earliest onset of cancer, an annual clinical breast exam, and perform monthly breast self-exams. Women in this family should also have a gynecological exam as recommended by their primary provider. All family members should have a colonoscopy by age 43.  FOLLOW-UP: Lastly, we discussed with Margaret Rojas that cancer genetics is a rapidly advancing field and it is possible that new genetic tests will be appropriate for her and/or her family members in the future. We encouraged her to remain in contact with cancer genetics on an annual basis so we can update her personal and family histories and let her know of advances in cancer genetics that may benefit this family.   Our contact number was provided. Margaret Rojas questions were answered to  her satisfaction, and she knows she is welcome to call us at anytime with additional questions or concerns.   Roma Kayser, MS, [facility] Certified Genetic Counselor [email or web address]

## 2017-05-28 NOTE — Telephone Encounter (Addendum)
Revealed negative genetic testing.  Discussed that we do not know why she has breast cancer or why there is cancer in the family. It could be due to a different gene that we are not testing, or maybe our current technology may not be able to pick something up.  It will be important for her to keep in contact with genetics to keep up with whether additional testing may be needed. 

## 2017-05-29 ENCOUNTER — Other Ambulatory Visit: Payer: Self-pay | Admitting: General Surgery

## 2017-05-29 DIAGNOSIS — C50412 Malignant neoplasm of upper-outer quadrant of left female breast: Secondary | ICD-10-CM

## 2017-06-01 NOTE — Pre-Procedure Instructions (Signed)
ELMINA HENDEL  06/01/2017      [id number] [place], [place] [address] [place] [address] Phone: [phone] Fax: [phone]  [facility] [place], [address] [place] [address] Phone: [phone] Fax: [phone]  [id number] [place], [place] AT [address] [place] [address] Phone: [phone] Fax: [phone]  [facility] [place] [address] [place] [address] Phone: [phone] Fax: [phone]    Your procedure is scheduled on June 09, 2017.  Report to [facility] Admitting at 1030 AM.  Call this number if you have problems the morning of surgery:  [phone]   Remember:  Do not eat food or drink liquids after midnight.  Take these medicines the morning of surgery with A SIP OF WATER acetaminophen (tylenol), alprazolam (xanax)-if needed, cyclobenzaprine (flexeril)-if needed, hydrocodone-acetaminophen (norco)-if needed, oxycodone (roxicodone)-if needed, sertraline (zoloft), tizanidine (zanaflex).  7 days prior to surgery STOP taking any Aspirin, Aleve, Naproxen, Ibuprofen, Motrin, Advil, Goody's, BC's, all herbal medications, fish oil, and all vitamins   Do not wear jewelry, make-up or nail polish.  Do not wear lotions, powders, or perfumes, or deoderant.  Do not shave 48 hours prior to surgery.    Do not bring valuables to the hospital.  [facility] is not responsible for any belongings or valuables.  Contacts, dentures or bridgework may not be worn into surgery.  Leave your suitcase in the car.  After surgery it may be brought to your room.  For patients admitted to the hospital, discharge time will be determined by your treatment team.  Patients discharged the day of surgery will not be allowed to drive home.    Special instructions:  [facility]- Preparing For Surgery  Before surgery, you can play an important role. Because skin is not sterile, your skin needs to be as free of germs as possible. You can reduce the number of germs on your skin by washing with CHG (chlorahexidine gluconate) Soap before surgery.  CHG is an antiseptic cleaner which kills germs and bonds with the skin to continue killing germs even after washing.  Please do not use if you have an allergy to CHG or antibacterial soaps. If your skin becomes reddened/irritated stop using the CHG.  Do not shave (including legs and underarms) for at least 48 hours prior to first CHG shower. It is OK to shave your face.  Please follow these instructions carefully.   1. Shower the NIGHT BEFORE SURGERY and the MORNING OF SURGERY with CHG.   2. If you chose to wash your hair, wash your hair first as usual with your normal shampoo.  3. After you shampoo, rinse your hair and body thoroughly to remove the shampoo.  4. Use CHG as you would any other liquid soap. You can apply CHG directly to the skin and wash gently with a scrungie or a clean washcloth.   5. Apply the CHG Soap to your body ONLY FROM THE NECK DOWN.  Do not use on open wounds or open sores. Avoid contact with your eyes, ears, mouth and genitals (private parts). Wash genitals (private parts) with your normal soap.  6. Wash thoroughly, paying special attention to the area where your surgery will be performed.  7. Thoroughly rinse your body with warm water from the  neck down.  8. DO NOT shower/wash with your normal soap after using and rinsing off the CHG Soap.  9. Pat yourself dry with a CLEAN TOWEL.   10. Wear CLEAN PAJAMAS   11. Place CLEAN SHEETS on your bed the night of your first shower and DO NOT SLEEP WITH PETS.    Day of Surgery: Do not apply any deodorants/lotions. Please wear clean clothes to the hospital/surgery center.     Please read over the following fact  sheets that you were given. Pain Booklet, Coughing and Deep Breathing and Surgical Site Infection Prevention

## 2017-06-02 ENCOUNTER — Encounter (HOSPITAL_COMMUNITY)
Admission: RE | Admit: 2017-06-02 | Discharge: 2017-06-02 | Disposition: A | Discharge status: Home / Self Care | Payer: 59 | Source: Ambulatory Visit | Attending: General Surgery | Admitting: General Surgery

## 2017-06-02 ENCOUNTER — Encounter (HOSPITAL_COMMUNITY): Payer: Self-pay

## 2017-06-02 ENCOUNTER — Ambulatory Visit
Admission: RE | Admit: 2017-06-02 | Discharge: 2017-06-02 | Disposition: A | Discharge status: Home / Self Care | Payer: 59 | Source: Ambulatory Visit | Attending: General Surgery | Admitting: General Surgery

## 2017-06-02 DIAGNOSIS — Z01818 Encounter for other preprocedural examination: Secondary | ICD-10-CM | POA: Diagnosis not present

## 2017-06-02 DIAGNOSIS — C50412 Malignant neoplasm of upper-outer quadrant of left female breast: Secondary | ICD-10-CM

## 2017-06-02 DIAGNOSIS — C50912 Malignant neoplasm of unspecified site of left female breast: Secondary | ICD-10-CM | POA: Diagnosis not present

## 2017-06-02 LAB — HCG, SERUM, QUALITATIVE: PREG SERUM: NEGATIVE

## 2017-06-02 LAB — BASIC METABOLIC PANEL
ANION GAP: 8 (ref 5–15)
BUN: 17 mg/dL (ref 6–20)
CALCIUM: 9 mg/dL (ref 8.9–10.3)
CO2: 22 mmol/L (ref 22–32)
CREATININE: 0.9 mg/dL (ref 0.44–1.00)
Chloride: 109 mmol/L (ref 101–111)
GLUCOSE: 88 mg/dL (ref 65–99)
Potassium: 3.8 mmol/L (ref 3.5–5.1)
Sodium: 139 mmol/L (ref 135–145)

## 2017-06-02 LAB — CBC
HCT: 41.9 % (ref 36.0–46.0)
HEMOGLOBIN: 13.8 g/dL (ref 12.0–15.0)
MCH: 30.1 pg (ref 26.0–34.0)
MCHC: 32.9 g/dL (ref 30.0–36.0)
MCV: 91.5 fL (ref 78.0–100.0)
PLATELETS: 276 10*3/uL (ref 150–400)
RBC: 4.58 MIL/uL (ref 3.87–5.11)
RDW: 13.1 % (ref 11.5–15.5)
WBC: 9.7 10*3/uL (ref 4.0–10.5)

## 2017-06-02 MED ORDER — GADOBENATE DIMEGLUMINE 529 MG/ML IV SOLN
18.0000 mL | Freq: Once | INTRAVENOUS | Status: AC | PRN
  Administered 2017-06-02: 18 mL via INTRAVENOUS

## 2017-06-02 NOTE — Progress Notes (Signed)
PCP: Dt. [employer] @ [facility]

## 2017-06-03 ENCOUNTER — Other Ambulatory Visit: Payer: Self-pay | Admitting: General Surgery

## 2017-06-03 ENCOUNTER — Other Ambulatory Visit: Payer: Self-pay

## 2017-06-03 DIAGNOSIS — Z170 Estrogen receptor positive status [ER+]: Principal | ICD-10-CM

## 2017-06-03 DIAGNOSIS — C50412 Malignant neoplasm of upper-outer quadrant of left female breast: Secondary | ICD-10-CM

## 2017-06-05 ENCOUNTER — Ambulatory Visit
Admission: RE | Admit: 2017-06-05 | Discharge: 2017-06-05 | Disposition: A | Discharge status: Home / Self Care | Payer: 59 | Source: Ambulatory Visit | Attending: General Surgery | Admitting: General Surgery

## 2017-06-05 ENCOUNTER — Other Ambulatory Visit: Payer: 59

## 2017-06-05 DIAGNOSIS — Z170 Estrogen receptor positive status [ER+]: Principal | ICD-10-CM

## 2017-06-05 DIAGNOSIS — C50412 Malignant neoplasm of upper-outer quadrant of left female breast: Secondary | ICD-10-CM

## 2017-06-05 MED ORDER — GADOBENATE DIMEGLUMINE 529 MG/ML IV SOLN
18.0000 mL | Freq: Once | INTRAVENOUS | Status: AC | PRN
  Administered 2017-06-05: 18 mL via INTRAVENOUS

## 2017-06-08 MED ORDER — ACETAMINOPHEN 500 MG PO TABS: 1000.0000 mg | ORAL_TABLET | ORAL | Status: DC

## 2017-06-08 MED ORDER — CEFAZOLIN SODIUM-DEXTROSE 2-4 GM/100ML-% IV SOLN: 2.0000 g | INTRAVENOUS | Status: DC

## 2017-06-08 MED ORDER — GABAPENTIN 300 MG PO CAPS: 300.0000 mg | ORAL_CAPSULE | ORAL | Status: DC

## 2017-06-09 ENCOUNTER — Ambulatory Visit (HOSPITAL_COMMUNITY): Admission: RE | Admit: 2017-06-09 | Payer: 59 | Source: Ambulatory Visit | Admitting: General Surgery

## 2017-06-09 ENCOUNTER — Encounter (HOSPITAL_COMMUNITY): Payer: 59

## 2017-06-09 ENCOUNTER — Encounter (HOSPITAL_COMMUNITY): Admission: RE | Payer: Self-pay | Source: Ambulatory Visit

## 2017-06-09 SURGERY — BREAST LUMPECTOMY WITH RADIOACTIVE SEED AND SENTINEL LYMPH NODE BIOPSY
Anesthesia: General | Site: Breast | Laterality: Left

## 2017-06-10 ENCOUNTER — Other Ambulatory Visit: Payer: Self-pay | Admitting: General Surgery

## 2017-06-10 DIAGNOSIS — C50919 Malignant neoplasm of unspecified site of unspecified female breast: Secondary | ICD-10-CM

## 2017-06-10 DIAGNOSIS — Z170 Estrogen receptor positive status [ER+]: Principal | ICD-10-CM

## 2017-06-10 DIAGNOSIS — C50011 Malignant neoplasm of nipple and areola, right female breast: Secondary | ICD-10-CM

## 2017-06-10 DIAGNOSIS — C50012 Malignant neoplasm of nipple and areola, left female breast: Principal | ICD-10-CM

## 2017-06-11 ENCOUNTER — Other Ambulatory Visit: Payer: Self-pay | Admitting: General Surgery

## 2017-06-11 DIAGNOSIS — C50012 Malignant neoplasm of nipple and areola, left female breast: Principal | ICD-10-CM

## 2017-06-11 DIAGNOSIS — Z170 Estrogen receptor positive status [ER+]: Principal | ICD-10-CM

## 2017-06-11 DIAGNOSIS — C50011 Malignant neoplasm of nipple and areola, right female breast: Secondary | ICD-10-CM

## 2017-06-12 ENCOUNTER — Telehealth: Payer: Self-pay | Admitting: Hematology and Oncology

## 2017-06-12 NOTE — Telephone Encounter (Signed)
Left message for patient regarding appts per 9/27 sch msg.

## 2017-06-15 ENCOUNTER — Encounter: Payer: Self-pay | Admitting: General Practice

## 2017-06-15 NOTE — Progress Notes (Signed)
[place] Note  Margaret Rojas by phone for f/u support as she prepares for surgery Friday.  She values check-ins as opportunities to receive care while also processing the most recent developments.  Per pt, she and her family are doing well overall.  MIL just visited, which was fun for children and meaningful for [name] and husband [name].  [name] is noticing that the cancer diagnosis is still abstract for the children because they haven't seen big changes, such as those that will come with surgery and radiation.  [name] also notes that her back is improving; she hopes to be able to return to work at least part-time following her bilateral lumpectomy recovery.  Per pt, she wants to resume normalcy in terms of personal schedule and contribution/meaning-making.  We plan to continue phone check-ins to help her reflect on her experience, needs, and feelings through her cancer experience.   [place], [place], [facility] Pager [phone] Voicemail [phone]

## 2017-06-16 ENCOUNTER — Other Ambulatory Visit: Payer: Self-pay | Admitting: Hematology and Oncology

## 2017-06-16 ENCOUNTER — Ambulatory Visit: Payer: 59 | Admitting: Hematology and Oncology

## 2017-06-18 ENCOUNTER — Ambulatory Visit
Admission: RE | Admit: 2017-06-18 | Discharge: 2017-06-18 | Disposition: A | Discharge status: Home / Self Care | Payer: 59 | Source: Ambulatory Visit | Attending: General Surgery | Admitting: General Surgery

## 2017-06-18 ENCOUNTER — Encounter (HOSPITAL_COMMUNITY): Payer: Self-pay | Admitting: *Deleted

## 2017-06-18 DIAGNOSIS — Z170 Estrogen receptor positive status [ER+]: Principal | ICD-10-CM

## 2017-06-18 DIAGNOSIS — C50012 Malignant neoplasm of nipple and areola, left female breast: Principal | ICD-10-CM

## 2017-06-18 DIAGNOSIS — C50011 Malignant neoplasm of nipple and areola, right female breast: Secondary | ICD-10-CM

## 2017-06-18 NOTE — Progress Notes (Signed)
Pt denies SOB, chest pain, and being under the care of a cardiologist. Pt denies having a stress test, echo and cardiac cath. Pt denies having an EKG and chest x ray within the ;last year. Pt made aware to stop taking  Aspirin, vitamins, fish oil and herbal medications. Do not take any NSAIDs ie: Ibuprofen, Advil, Naproxen (Aleve), Motrin, BC and Goody Powder or any medication containing Aspirin. Pt instructed to drink Boost Breeze (given at previous PAT) by 3:30AM DOS for a 5:30 AM arrival. Pt verbalized understanding of all pre-op instructions.

## 2017-06-19 ENCOUNTER — Encounter (HOSPITAL_COMMUNITY): Payer: Self-pay | Admitting: Certified Registered"

## 2017-06-19 ENCOUNTER — Ambulatory Visit
Admission: RE | Admit: 2017-06-19 | Discharge: 2017-06-19 | Disposition: A | Discharge status: Home / Self Care | Payer: 59 | Source: Ambulatory Visit | Attending: General Surgery | Admitting: General Surgery

## 2017-06-19 ENCOUNTER — Ambulatory Visit (HOSPITAL_COMMUNITY)
Admission: RE | Admit: 2017-06-19 | Discharge: 2017-06-19 | Disposition: A | Discharge status: Home / Self Care | Payer: 59 | Source: Ambulatory Visit | Attending: General Surgery | Admitting: General Surgery

## 2017-06-19 ENCOUNTER — Encounter (HOSPITAL_COMMUNITY)
Admission: RE | Disposition: A | Discharge status: Home / Self Care | Payer: Self-pay | Source: Ambulatory Visit | Attending: General Surgery

## 2017-06-19 ENCOUNTER — Encounter (HOSPITAL_COMMUNITY)
Admission: RE | Admit: 2017-06-19 | Discharge: 2017-06-19 | Disposition: A | Discharge status: Home / Self Care | Payer: 59 | Source: Ambulatory Visit | Attending: General Surgery | Admitting: General Surgery

## 2017-06-19 ENCOUNTER — Ambulatory Visit (HOSPITAL_COMMUNITY): Payer: 59 | Admitting: Anesthesiology

## 2017-06-19 DIAGNOSIS — Z90710 Acquired absence of both cervix and uterus: Secondary | ICD-10-CM | POA: Insufficient documentation

## 2017-06-19 DIAGNOSIS — Z841 Family history of disorders of kidney and ureter: Secondary | ICD-10-CM | POA: Diagnosis not present

## 2017-06-19 DIAGNOSIS — Z836 Family history of other diseases of the respiratory system: Secondary | ICD-10-CM | POA: Diagnosis not present

## 2017-06-19 DIAGNOSIS — Z79891 Long term (current) use of opiate analgesic: Secondary | ICD-10-CM | POA: Diagnosis not present

## 2017-06-19 DIAGNOSIS — Z8489 Family history of other specified conditions: Secondary | ICD-10-CM | POA: Insufficient documentation

## 2017-06-19 DIAGNOSIS — Z170 Estrogen receptor positive status [ER+]: Principal | ICD-10-CM

## 2017-06-19 DIAGNOSIS — Z87891 Personal history of nicotine dependence: Secondary | ICD-10-CM | POA: Diagnosis not present

## 2017-06-19 DIAGNOSIS — G43909 Migraine, unspecified, not intractable, without status migrainosus: Secondary | ICD-10-CM | POA: Insufficient documentation

## 2017-06-19 DIAGNOSIS — Z981 Arthrodesis status: Secondary | ICD-10-CM | POA: Insufficient documentation

## 2017-06-19 DIAGNOSIS — C50011 Malignant neoplasm of nipple and areola, right female breast: Secondary | ICD-10-CM

## 2017-06-19 DIAGNOSIS — C50012 Malignant neoplasm of nipple and areola, left female breast: Principal | ICD-10-CM

## 2017-06-19 DIAGNOSIS — C50912 Malignant neoplasm of unspecified site of left female breast: Secondary | ICD-10-CM | POA: Insufficient documentation

## 2017-06-19 DIAGNOSIS — F329 Major depressive disorder, single episode, unspecified: Secondary | ICD-10-CM | POA: Insufficient documentation

## 2017-06-19 DIAGNOSIS — Z800 Family history of malignant neoplasm of digestive organs: Secondary | ICD-10-CM | POA: Insufficient documentation

## 2017-06-19 DIAGNOSIS — Z808 Family history of malignant neoplasm of other organs or systems: Secondary | ICD-10-CM | POA: Diagnosis not present

## 2017-06-19 DIAGNOSIS — Z823 Family history of stroke: Secondary | ICD-10-CM | POA: Insufficient documentation

## 2017-06-19 DIAGNOSIS — C50919 Malignant neoplasm of unspecified site of unspecified female breast: Secondary | ICD-10-CM

## 2017-06-19 DIAGNOSIS — Z79810 Long term (current) use of selective estrogen receptor modulators (SERMs): Secondary | ICD-10-CM | POA: Diagnosis not present

## 2017-06-19 DIAGNOSIS — Z8249 Family history of ischemic heart disease and other diseases of the circulatory system: Secondary | ICD-10-CM | POA: Diagnosis not present

## 2017-06-19 DIAGNOSIS — Z9889 Other specified postprocedural states: Secondary | ICD-10-CM | POA: Insufficient documentation

## 2017-06-19 DIAGNOSIS — Z803 Family history of malignant neoplasm of breast: Secondary | ICD-10-CM | POA: Diagnosis not present

## 2017-06-19 DIAGNOSIS — F419 Anxiety disorder, unspecified: Secondary | ICD-10-CM | POA: Insufficient documentation

## 2017-06-19 DIAGNOSIS — Z8261 Family history of arthritis: Secondary | ICD-10-CM | POA: Diagnosis not present

## 2017-06-19 DIAGNOSIS — Z79899 Other long term (current) drug therapy: Secondary | ICD-10-CM | POA: Diagnosis not present

## 2017-06-19 DIAGNOSIS — C50911 Malignant neoplasm of unspecified site of right female breast: Secondary | ICD-10-CM | POA: Insufficient documentation

## 2017-06-19 HISTORY — DX: Malignant neoplasm of unspecified site of unspecified female breast: C50.919

## 2017-06-19 HISTORY — DX: Malignant (primary) neoplasm, unspecified: C80.1

## 2017-06-19 HISTORY — PX: BREAST LUMPECTOMY: SHX2

## 2017-06-19 HISTORY — PX: BREAST LUMPECTOMY WITH RADIOACTIVE SEED AND SENTINEL LYMPH NODE BIOPSY: SHX6550

## 2017-06-19 LAB — CBC
HCT: 39.8 % (ref 36.0–46.0)
Hemoglobin: 12.5 g/dL (ref 12.0–15.0)
MCH: 29.2 pg (ref 26.0–34.0)
MCHC: 31.4 g/dL (ref 30.0–36.0)
MCV: 93 fL (ref 78.0–100.0)
PLATELETS: 217 10*3/uL (ref 150–400)
RBC: 4.28 MIL/uL (ref 3.87–5.11)
RDW: 13.6 % (ref 11.5–15.5)
WBC: 8.6 10*3/uL (ref 4.0–10.5)

## 2017-06-19 LAB — BASIC METABOLIC PANEL
Anion gap: 3 — ABNORMAL LOW (ref 5–15)
BUN: 13 mg/dL (ref 6–20)
CO2: 22 mmol/L (ref 22–32)
CREATININE: 0.67 mg/dL (ref 0.44–1.00)
Calcium: 8.1 mg/dL — ABNORMAL LOW (ref 8.9–10.3)
Chloride: 110 mmol/L (ref 101–111)
GFR calc Af Amer: >60 mL/min (ref >60)
GLUCOSE: 75 mg/dL (ref 65–99)
POTASSIUM: 3.6 mmol/L (ref 3.5–5.1)
SODIUM: 135 mmol/L (ref 135–145)

## 2017-06-19 LAB — HCG, SERUM, QUALITATIVE: Preg, Serum: NEGATIVE

## 2017-06-19 SURGERY — BREAST LUMPECTOMY WITH RADIOACTIVE SEED AND SENTINEL LYMPH NODE BIOPSY
Anesthesia: General | Site: Breast | Laterality: Bilateral

## 2017-06-19 MED ORDER — KETOROLAC TROMETHAMINE 30 MG/ML IJ SOLN
30.0000 mg | Freq: Once | INTRAMUSCULAR | Status: DC | PRN
  Administered 2017-06-19: 30 mg via INTRAVENOUS

## 2017-06-19 MED ORDER — ACETAMINOPHEN 500 MG PO TABS
1000.0000 mg | ORAL_TABLET | ORAL | Status: AC
  Administered 2017-06-19: 1000 mg via ORAL
  Filled 2017-06-19: qty 2

## 2017-06-19 MED ORDER — SODIUM CHLORIDE 0.9% FLUSH: 3.0000 mL | INTRAVENOUS | Status: DC | PRN

## 2017-06-19 MED ORDER — SODIUM CHLORIDE 0.9 % IV SOLN: 250.0000 mL | INTRAVENOUS | Status: DC | PRN

## 2017-06-19 MED ORDER — HYDROMORPHONE HCL 1 MG/ML IJ SOLN
INTRAMUSCULAR | Status: DC
Start: 2017-06-19 — End: 2017-06-19
  Filled 2017-06-19: qty 1

## 2017-06-19 MED ORDER — SODIUM CHLORIDE 0.9% FLUSH: 3.0000 mL | Freq: Two times a day (BID) | INTRAVENOUS | Status: DC

## 2017-06-19 MED ORDER — BUPIVACAINE-EPINEPHRINE 0.25% -1:200000 IJ SOLN
INTRAMUSCULAR | Status: DC | PRN
Start: 2017-06-19 — End: 2017-06-19
  Administered 2017-06-19: 13 mL

## 2017-06-19 MED ORDER — MIDAZOLAM HCL 5 MG/5ML IJ SOLN
INTRAMUSCULAR | Status: DC | PRN
  Administered 2017-06-19: 2 mg via INTRAVENOUS

## 2017-06-19 MED ORDER — PROMETHAZINE HCL 25 MG/ML IJ SOLN: 6.2500 mg | INTRAMUSCULAR | Status: DC | PRN

## 2017-06-19 MED ORDER — PROPOFOL 10 MG/ML IV BOLUS
INTRAVENOUS | Status: AC
  Filled 2017-06-19: qty 20

## 2017-06-19 MED ORDER — ONDANSETRON HCL 4 MG/2ML IJ SOLN
INTRAMUSCULAR | Status: DC | PRN
  Administered 2017-06-19: 4 mg via INTRAVENOUS

## 2017-06-19 MED ORDER — DEXAMETHASONE SODIUM PHOSPHATE 10 MG/ML IJ SOLN
INTRAMUSCULAR | Status: DC | PRN
  Administered 2017-06-19: 10 mg via INTRAVENOUS

## 2017-06-19 MED ORDER — TECHNETIUM TC 99M SULFUR COLLOID FILTERED
1.0000 | Freq: Once | INTRAVENOUS | Status: AC | PRN
  Administered 2017-06-19: 1 via INTRADERMAL

## 2017-06-19 MED ORDER — FENTANYL CITRATE (PF) 250 MCG/5ML IJ SOLN
INTRAMUSCULAR | Status: AC
  Filled 2017-06-19: qty 5

## 2017-06-19 MED ORDER — SCOPOLAMINE 1 MG/3DAYS TD PT72
MEDICATED_PATCH | TRANSDERMAL | Status: AC
  Filled 2017-06-19: qty 1

## 2017-06-19 MED ORDER — SCOPOLAMINE 1 MG/3DAYS TD PT72
MEDICATED_PATCH | TRANSDERMAL | Status: DC | PRN
  Administered 2017-06-19: 1 via TRANSDERMAL

## 2017-06-19 MED ORDER — 0.9 % SODIUM CHLORIDE (POUR BTL) OPTIME
TOPICAL | Status: DC | PRN
Start: 2017-06-19 — End: 2017-06-19
  Administered 2017-06-19: 1000 mL

## 2017-06-19 MED ORDER — HYDROMORPHONE HCL 1 MG/ML IJ SOLN
0.2500 mg | INTRAMUSCULAR | Status: DC | PRN
  Administered 2017-06-19: 0.5 mg via INTRAVENOUS
  Administered 2017-06-19: 0.25 mg via INTRAVENOUS

## 2017-06-19 MED ORDER — MIDAZOLAM HCL 2 MG/2ML IJ SOLN
INTRAMUSCULAR | Status: AC
  Filled 2017-06-19: qty 2

## 2017-06-19 MED ORDER — KETOROLAC TROMETHAMINE 30 MG/ML IJ SOLN
INTRAMUSCULAR | Status: AC
  Filled 2017-06-19: qty 1

## 2017-06-19 MED ORDER — LACTATED RINGERS IV SOLN
INTRAVENOUS | Status: DC | PRN
  Administered 2017-06-19: 07:00:00 via INTRAVENOUS

## 2017-06-19 MED ORDER — HEMOSTATIC AGENTS (NO CHARGE) OPTIME
TOPICAL | Status: DC | PRN
  Administered 2017-06-19: 1 via TOPICAL

## 2017-06-19 MED ORDER — DEXAMETHASONE SODIUM PHOSPHATE 10 MG/ML IJ SOLN
INTRAMUSCULAR | Status: AC
  Filled 2017-06-19: qty 1

## 2017-06-19 MED ORDER — BUPIVACAINE-EPINEPHRINE (PF) 0.25% -1:200000 IJ SOLN
INTRAMUSCULAR | Status: AC
  Filled 2017-06-19: qty 30

## 2017-06-19 MED ORDER — SODIUM CHLORIDE 0.9 % IV SOLN: INTRAVENOUS | Status: DC

## 2017-06-19 MED ORDER — ACETAMINOPHEN 650 MG RE SUPP: 650.0000 mg | RECTAL | Status: DC | PRN

## 2017-06-19 MED ORDER — FENTANYL CITRATE (PF) 100 MCG/2ML IJ SOLN
INTRAMUSCULAR | Status: DC | PRN
  Administered 2017-06-19 (×2): 100 ug via INTRAVENOUS
  Administered 2017-06-19: 50 ug via INTRAVENOUS
  Administered 2017-06-19: 100 ug via INTRAVENOUS
  Administered 2017-06-19 (×2): 50 ug via INTRAVENOUS

## 2017-06-19 MED ORDER — ACETAMINOPHEN 325 MG PO TABS: 650.0000 mg | ORAL_TABLET | ORAL | Status: DC | PRN

## 2017-06-19 MED ORDER — CEFAZOLIN SODIUM-DEXTROSE 2-4 GM/100ML-% IV SOLN
2.0000 g | INTRAVENOUS | Status: AC
  Administered 2017-06-19: 2 g via INTRAVENOUS
  Filled 2017-06-19: qty 100

## 2017-06-19 MED ORDER — BUPIVACAINE HCL (PF) 0.25 % IJ SOLN
INTRAMUSCULAR | Status: DC | PRN
  Administered 2017-06-19: 60 mL

## 2017-06-19 MED ORDER — LIDOCAINE 2% (20 MG/ML) 5 ML SYRINGE
INTRAMUSCULAR | Status: DC | PRN
  Administered 2017-06-19: 60 mg via INTRAVENOUS

## 2017-06-19 MED ORDER — LIDOCAINE 2% (20 MG/ML) 5 ML SYRINGE
INTRAMUSCULAR | Status: AC
  Filled 2017-06-19: qty 5

## 2017-06-19 MED ORDER — OXYCODONE HCL 5 MG PO TABS
ORAL_TABLET | ORAL | Status: AC
  Filled 2017-06-19: qty 1

## 2017-06-19 MED ORDER — OXYCODONE HCL 5 MG PO TABS: 5.0000 mg | ORAL_TABLET | Freq: Four times a day (QID) | ORAL | 0 refills | Status: DC | PRN

## 2017-06-19 MED ORDER — METHYLENE BLUE 0.5 % INJ SOLN
INTRAVENOUS | Status: AC
  Filled 2017-06-19: qty 10

## 2017-06-19 MED ORDER — ONDANSETRON HCL 4 MG/2ML IJ SOLN
INTRAMUSCULAR | Status: AC
  Filled 2017-06-19: qty 2

## 2017-06-19 MED ORDER — SODIUM CHLORIDE 0.9 % IJ SOLN
INTRAMUSCULAR | Status: AC
  Filled 2017-06-19: qty 10

## 2017-06-19 MED ORDER — PROPOFOL 10 MG/ML IV BOLUS
INTRAVENOUS | Status: DC | PRN
  Administered 2017-06-19: 200 mg via INTRAVENOUS

## 2017-06-19 MED ORDER — OXYCODONE HCL 5 MG PO TABS
5.0000 mg | ORAL_TABLET | ORAL | Status: DC | PRN
  Administered 2017-06-19: 5 mg via ORAL

## 2017-06-19 MED ORDER — MORPHINE SULFATE (PF) 2 MG/ML IV SOLN: 2.0000 mg | INTRAVENOUS | Status: DC | PRN

## 2017-06-19 MED ORDER — GABAPENTIN 300 MG PO CAPS
300.0000 mg | ORAL_CAPSULE | ORAL | Status: AC
  Administered 2017-06-19: 300 mg via ORAL
  Filled 2017-06-19: qty 1

## 2017-06-19 MED ORDER — EPHEDRINE 5 MG/ML INJ
INTRAVENOUS | Status: AC
  Filled 2017-06-19: qty 10

## 2017-06-19 SURGICAL SUPPLY — 51 items
ADH SKN CLS APL DERMABOND .7 (GAUZE/BANDAGES/DRESSINGS) ×1
APPLIER CLIP 9.375 MED OPEN (MISCELLANEOUS) ×3
APR CLP MED 9.3 20 MLT OPN (MISCELLANEOUS) ×1
BINDER BREAST LRG (GAUZE/BANDAGES/DRESSINGS) IMPLANT
BINDER BREAST XLRG (GAUZE/BANDAGES/DRESSINGS) ×2 IMPLANT
BLADE SURG 15 STRL LF DISP TIS (BLADE) ×1 IMPLANT
BLADE SURG 15 STRL SS (BLADE) ×3
CANISTER SUCT 3000ML PPV (MISCELLANEOUS) ×3 IMPLANT
CHLORAPREP W/TINT 26ML (MISCELLANEOUS) ×5 IMPLANT
CLIP APPLIE 9.375 MED OPEN (MISCELLANEOUS) ×1 IMPLANT
CLOSURE WOUND 1/2 X4 (GAUZE/BANDAGES/DRESSINGS) ×1
COVER PROBE W GEL 5X96 (DRAPES) ×3 IMPLANT
COVER SURGICAL LIGHT HANDLE (MISCELLANEOUS) ×3 IMPLANT
DERMABOND ADVANCED (GAUZE/BANDAGES/DRESSINGS) ×2
DERMABOND ADVANCED .7 DNX12 (GAUZE/BANDAGES/DRESSINGS) ×1 IMPLANT
DEVICE DUBIN SPECIMEN MAMMOGRA (MISCELLANEOUS) ×5 IMPLANT
DRAPE CHEST BREAST 15X10 FENES (DRAPES) ×3 IMPLANT
DRAPE UTILITY XL STRL (DRAPES) ×5 IMPLANT
ELECT COATED BLADE 2.86 ST (ELECTRODE) ×3 IMPLANT
ELECT REM PT RETURN 9FT ADLT (ELECTROSURGICAL) ×3
ELECTRODE REM PT RTRN 9FT ADLT (ELECTROSURGICAL) ×1 IMPLANT
GLOVE BIO SURGEON STRL SZ7 (GLOVE) ×3 IMPLANT
GLOVE BIOGEL PI IND STRL 7.5 (GLOVE) ×1 IMPLANT
GLOVE BIOGEL PI INDICATOR 7.5 (GLOVE) ×2
GOWN STRL REUS W/ TWL LRG LVL3 (GOWN DISPOSABLE) ×2 IMPLANT
GOWN STRL REUS W/TWL LRG LVL3 (GOWN DISPOSABLE) ×9
HEMOSTAT ARISTA ABSORB 3G PWDR (MISCELLANEOUS) ×2 IMPLANT
ILLUMINATOR WAVEGUIDE N/F (MISCELLANEOUS) IMPLANT
KIT BASIN OR (CUSTOM PROCEDURE TRAY) ×3 IMPLANT
KIT MARKER MARGIN INK (KITS) ×3 IMPLANT
MARKER SKIN DUAL TIP RULER LAB (MISCELLANEOUS) ×3 IMPLANT
NDL HYPO 25GX1X1/2 BEV (NEEDLE) ×1 IMPLANT
NEEDLE HYPO 25GX1X1/2 BEV (NEEDLE) ×3 IMPLANT
NS IRRIG 1000ML POUR BTL (IV SOLUTION) ×3 IMPLANT
PACK SURGICAL SETUP 50X90 (CUSTOM PROCEDURE TRAY) ×3 IMPLANT
PENCIL BUTTON HOLSTER BLD 10FT (ELECTRODE) ×3 IMPLANT
SPONGE LAP 18X18 X RAY DECT (DISPOSABLE) ×5 IMPLANT
STRIP CLOSURE SKIN 1/2X4 (GAUZE/BANDAGES/DRESSINGS) ×2 IMPLANT
SUT MNCRL AB 4-0 PS2 18 (SUTURE) ×8 IMPLANT
SUT SILK 2 0 FS (SUTURE) ×2 IMPLANT
SUT VIC AB 2-0 SH 27 (SUTURE) ×15
SUT VIC AB 2-0 SH 27XBRD (SUTURE) ×2 IMPLANT
SUT VIC AB 3-0 SH 27 (SUTURE) ×9
SUT VIC AB 3-0 SH 27X BRD (SUTURE) ×2 IMPLANT
SUT VIC AB 3-0 SH 27XBRD (SUTURE) IMPLANT
SYR BULB 3OZ (MISCELLANEOUS) ×3 IMPLANT
SYR CONTROL 10ML LL (SYRINGE) ×3 IMPLANT
TOWEL OR 17X24 6PK STRL BLUE (TOWEL DISPOSABLE) ×3 IMPLANT
TUBE CONNECTING 12'X1/4 (SUCTIONS) ×1
TUBE CONNECTING 12X1/4 (SUCTIONS) ×2 IMPLANT
YANKAUER SUCT BULB TIP NO VENT (SUCTIONS) ×5 IMPLANT

## 2017-06-19 NOTE — Interval H&P Note (Signed)
History and Physical Interval Note:  06/19/2017 7:03 AM  Margaret Rojas  has presented today for surgery, with the diagnosis of BILATERAL BREAST CANCER  The various methods of treatment have been discussed with the patient and family. After consideration of risks, benefits and other options for treatment, the patient has consented to  Procedure(s): BILATERAL BREAST LUMPECTOMIES WITH BILATERAL RADIOACTIVE SEED AND BILATERAL SENTINEL LYMPH NODE BIOPSIES (Bilateral) as a surgical intervention .  The patient's history has been reviewed, patient examined, no change in status, stable for surgery.  I have reviewed the patient's chart and labs.  Questions were answered to the patient's satisfaction.     Ordell Prichett

## 2017-06-19 NOTE — Op Note (Signed)
Preoperative diagnosis: clinical stage I right breast cancer, clinical stage I left breast cancer Postoperative diagnosis: same as above Procedure:  1.  Rightbreast seed guided lumpectomy 2. Right deep axillary sentinel node biopsy 3. Left breast seed guided lumpectomy 4. Left deep axillary sentinel node biopsy Surgeon: Dr Serita Grammes EBL: 50 cc Anes: general with bilateral pectoral block Specimens  1. Right breast tissue marked with paint 2. Rightaxillary sentinel nodes with highest count 591 3. Left breast tissue marked with paint 4. Additional superior margin left breast marked short superior, long lateral, double deep 5. Left axillary sentinel nodes with highest count 5809 Complications none Drains none Sponge count correct Dispo to pacu stable  Indications: This is a 35 yof with bilateral clinical stage I breast cancers. We have discussed all options and she has elected to proceed with bilateral breast conservation therapy. Her genetic testing was negative.   Procedure: After informed consent was obtained the patient was taken to the operating room. She first was given technetium in standard periareolar fashion bilaterally. She had a pectoral block bilaterally. She was given antibiotics. Sequential compression devices were on her legs. She was then placed under general anesthesia with an LMA. Then she was prepped and draped in the standard sterile surgical fashion. Surgical timeout was then performed.  I then located the seed in the upper outer leftbreast. I infiltrated marcaine in the skin and then made a upper outer quadrant breast incision in an attempt to hide the scar later. This needed to be done close to lesion as the seed was close to the skin.  I tunneled to the seed and the mass using the lighted retractor. I then used the neoprobe to remove the seed and the surrounding tissue with attempt to get clear margins. I marked this with paint. MM confirmed removal of  seed and clip.I placed clips in the cavity.I then entered the axilla from the same incision.I carried this through the axillary fascia. I then located the sentinel nodes. I excised the radioactive nodes. There were no enlarged nodes. The background radioactivity was zero. I then obtained hemostasis. This oozed a fair amount and I did place some arista in the axilla. I closed the axillary fascia with 2-0 vicryl and approximated the breast tissue with 2-0 vicryl.  The skin was closed with 3-0 vicryl and 4-0 monocryl. Glue and steristrips were applied.   I then located the seed in the inferior right breast. I infiltrated marcaine in the skin and then made an inframammary incision in an attempt to hide the scar later. I tunneled to the seed and the mass using the lighted retractor. I then used the neoprobe to remove the seed and the surrounding tissue with attempt to get clear margins. I marked this with paint. MM confirmed removal of seed and clip. I did remove additional superior margin as I thought these might be close. I did remove some additional superior margin. The deep margin is the muscle. I placed clips in the cavity. I then obtained hemostasis. I closed this with 2-0 vicryl, 3-0 vicryl and 5-0 monocryl. Glue and steristrips were applied.   I then made an incision in the axilla and carried this through the axillary fascia. I then located the sentinel nodes. I excised the radioactive nodes. There were no enlarged nodes. The background radioactivity was zero. I then obtained hemostasis.  =I closed the fascia with 2-0 vicryl. The skin was closed with 3-0 vicryl and 4-0 monocryl. Glue and steristrips were applied. A  binder was placed. She was extubated and transferred to PACU.

## 2017-06-19 NOTE — Anesthesia Procedure Notes (Signed)
Anesthesia Procedure Image    

## 2017-06-19 NOTE — Progress Notes (Signed)
Patient has had runs of Ventricular Bigeminy. [facility] notified. Instructed patient to follow up with her PCP. Patient has no discomfort in her chest or dizziness to report. VSS

## 2017-06-19 NOTE — Anesthesia Preprocedure Evaluation (Addendum)
Anesthesia Evaluation  Patient identified by MRN, date of birth, ID band Patient awake    Reviewed: Allergy & Precautions, NPO status , Patient's Chart, lab work & pertinent test results  Airway Mallampati: II  TM Distance: >3 FB Neck ROM: Full    Dental no notable dental hx.    Pulmonary neg pulmonary ROS, former smoker,    Pulmonary exam normal breath sounds clear to auscultation       Cardiovascular negative cardio ROS Normal cardiovascular exam Rhythm:Regular Rate:Normal     Neuro/Psych negative neurological ROS  negative psych ROS   GI/Hepatic Neg liver ROS, GERD  ,  Endo/Other  negative endocrine ROS  Renal/GU negative Renal ROS  negative genitourinary   Musculoskeletal negative musculoskeletal ROS (+)   Abdominal   Peds negative pediatric ROS (+)  Hematology negative hematology ROS (+)   Anesthesia Other Findings   Reproductive/Obstetrics negative OB ROS                             Anesthesia Physical Anesthesia Plan  ASA: II  Anesthesia Plan: General   Post-op Pain Management:  Regional for Post-op pain   Induction: Intravenous  PONV Risk Score and Plan: 3 and Ondansetron, Dexamethasone, Midazolam and Scopolamine patch - Pre-op  Airway Management Planned: Oral ETT and LMA  Additional Equipment:   Intra-op Plan:   Post-operative Plan: Extubation in OR  Informed Consent: I have reviewed the patients History and Physical, chart, labs and discussed the procedure including the risks, benefits and alternatives for the proposed anesthesia with the patient or authorized representative who has indicated his/her understanding and acceptance.   Dental advisory given  Plan Discussed with: CRNA and Surgeon  Anesthesia Plan Comments:         Anesthesia Quick Evaluation

## 2017-06-19 NOTE — Discharge Instructions (Signed)
[facility] °Office Phone Number [phone] °POST OP INSTRUCTIONS ° °Always review your discharge instruction sheet given to you by the facility where your surgery was performed. ° °IF YOU HAVE DISABILITY OR FAMILY LEAVE FORMS, YOU MUST BRING THEM TO THE OFFICE FOR PROCESSING.  DO NOT GIVE THEM TO YOUR DOCTOR. ° °1. A prescription for pain medication may be given to you upon discharge.  Take your pain medication as prescribed, if needed.  If narcotic pain medicine is not needed, then you may take acetaminophen (Tylenol), naprosyn (Alleve) or ibuprofen (Advil) as needed. °2. Take your usually prescribed medications unless otherwise directed °3. If you need a refill on your pain medication, please contact your pharmacy.  They will contact our office to request authorization.  Prescriptions will not be filled after 5pm or on week-ends. °4. You should eat very light the first 24 hours after surgery, such as soup, crackers, pudding, etc.  Resume your normal diet the day after surgery. °5. Most patients will experience some swelling and bruising in the breast.  Ice packs and a good support bra will help.  Wear the breast binder provided or a sports bra for 72 hours day and night.  After that wear a sports bra during the day until you return to the office. Swelling and bruising can take several days to resolve.  °6. It is common to experience some constipation if taking pain medication after surgery.  Increasing fluid intake and taking a stool softener will usually help or prevent this problem from occurring.  A mild laxative (Milk of Magnesia or Miralax) should be taken according to package directions if there are no bowel movements after 48 hours. °7. Unless discharge instructions indicate otherwise, you may remove your bandages 48 hours after surgery and you may shower at that time.  You may have steri-strips (small skin tapes) in place directly over the incision.  These strips should be left on the  skin for 7-10 days and will come off on their own.  If your surgeon used skin glue on the incision, you may shower in 24 hours.  The glue will flake off over the next 2-3 weeks.  Any sutures or staples will be removed at the office during your follow-up visit. °8. ACTIVITIES:  You may resume regular daily activities (gradually increasing) beginning the next day.  Wearing a good support bra or sports bra minimizes pain and swelling.  You may have sexual intercourse when it is comfortable. °a. You may drive when you no longer are taking prescription pain medication, you can comfortably wear a seatbelt, and you can safely maneuver your car and apply brakes. °b. RETURN TO WORK:  ______________________________________________________________________________________ °9. You should see your doctor in the office for a follow-up appointment approximately two weeks after your surgery.  Your doctor’s nurse will typically make your follow-up appointment when she calls you with your pathology report.  Expect your pathology report 3-4 business days after your surgery.  You may call to check if you do not hear from us after three days. °10. OTHER INSTRUCTIONS: _______________________________________________________________________________________________ _____________________________________________________________________________________________________________________________________ °_____________________________________________________________________________________________________________________________________ °_____________________________________________________________________________________________________________________________________ ° °WHEN TO CALL DR Pilot Prindle: °1. Fever over 101.0 °2. Nausea and/or vomiting. °3. Extreme swelling or bruising. °4. Continued bleeding from incision. °5. Increased pain, redness, or drainage from the incision. ° °The clinic staff is available to answer your questions during regular  business hours.  Please don’t hesitate to call and ask to speak to one of the nurses for clinical concerns.  If you   have a medical emergency, go to the nearest emergency room or call 911.  A surgeon from [facility] is always on call at the hospital. ° °For further questions, please visit [email or web address] ° °

## 2017-06-19 NOTE — Anesthesia Procedure Notes (Addendum)
Anesthesia Regional Block: Pectoralis block   Pre-Anesthetic Checklist: ,, timeout performed, Correct Patient, Correct Site, Correct Laterality, Correct Procedure, Correct Position, site marked, Risks and benefits discussed,  Surgical consent,  Pre-op evaluation,  At surgeon's request and post-op pain management  Laterality: Left and Right  Prep: chloraprep       Needles:  Injection technique: Single-shot  Needle Type: Echogenic Needle     Needle Length: 9cm      Additional Needles:   Procedures:,,,, ultrasound used (permanent image in chart),,,,  Narrative:  Start time: 06/19/2017 6:45 AM End time: 06/19/2017 7:05 AM Injection made incrementally with aspirations every 5 mL.  Performed by: Personally  Anesthesiologist: Axyl Sitzman  Additional Notes: Patient tolerated the procedure well without complications  Bilateral pec blocks

## 2017-06-19 NOTE — Anesthesia Postprocedure Evaluation (Signed)
Anesthesia Post Note  Patient: Margaret Rojas  Procedure(s) Performed: BILATERAL BREAST LUMPECTOMIES WITH BILATERAL RADIOACTIVE SEED AND BILATERAL SENTINEL LYMPH NODE BIOPSIES (Bilateral Breast)     Patient location during evaluation: PACU Anesthesia Type: General Level of consciousness: awake and alert Pain management: pain level controlled Vital Signs Assessment: post-procedure vital signs reviewed and stable Respiratory status: spontaneous breathing, nonlabored ventilation and respiratory function stable Cardiovascular status: blood pressure returned to baseline and stable Postop Assessment: no apparent nausea or vomiting Anesthetic complications: no    Last Vitals:  Vitals:   06/19/17 1015 06/19/17 1030  BP: 120/65 126/63  Pulse: 100 90  Resp: (!) 9 16  Temp:    SpO2: 100% 100%    Last Pain:  Vitals:   06/19/17 1015  TempSrc:   PainSc: [place]
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
## 2017-06-19 NOTE — Anesthesia Procedure Notes (Signed)
Procedure Name: LMA Insertion Date/Time: 06/19/2017 7:42 AM Performed by: Melina Copa, Jazline Cumbee R Pre-anesthesia Checklist: Patient identified, Emergency Drugs available, Suction available and Patient being monitored Patient Re-evaluated:Patient Re-evaluated prior to induction Oxygen Delivery Method: Circle System Utilized Preoxygenation: Pre-oxygenation with 100% oxygen Induction Type: IV induction Ventilation: Mask ventilation without difficulty LMA: LMA inserted LMA Size: 5.0 Number of attempts: 1 Placement Confirmation: positive ETCO2 Tube secured with: Tape Dental Injury: Teeth and Oropharynx as per pre-operative assessment

## 2017-06-19 NOTE — Transfer of Care (Signed)
Immediate Anesthesia Transfer of Care Note  Patient: Margaret Rojas  Procedure(s) Performed: BILATERAL BREAST LUMPECTOMIES WITH BILATERAL RADIOACTIVE SEED AND BILATERAL SENTINEL LYMPH NODE BIOPSIES (Bilateral Breast)  Patient Location: PACU  Anesthesia Type:GA combined with regional for post-op pain  Level of Consciousness: awake, oriented and patient cooperative  Airway & Oxygen Therapy: Patient Spontanous Breathing and Patient connected to nasal cannula oxygen  Post-op Assessment: Report given to RN, Post -op Vital signs reviewed and stable and Patient moving all extremities  Post vital signs: Reviewed and stable  Last Vitals:  Vitals:   06/19/17 0558 06/19/17 0955  BP: 127/72   Pulse: 62   Resp: 20 (P) 16  Temp: 36.8 C (P) 36.8 C  SpO2: 100%     Last Pain:  Vitals:   06/19/17 0647  TempSrc:   PainSc: 6       Patients Stated Pain Goal: 4 ([id number])  Complications: No apparent anesthesia complications

## 2017-06-20 ENCOUNTER — Encounter (HOSPITAL_COMMUNITY): Payer: Self-pay | Admitting: General Surgery

## 2017-06-23 ENCOUNTER — Other Ambulatory Visit: Payer: Self-pay | Admitting: General Surgery

## 2017-06-23 ENCOUNTER — Telehealth: Payer: Self-pay | Admitting: *Deleted

## 2017-06-23 NOTE — Telephone Encounter (Signed)
Received order for oncotype testing. Requisition sent to pathology. Received by Tammy.  Called pt and discussed oncotype testing and that Dr. Donne Hazel will wait to schedule re-excision until oncotype score has returned. Received verbal understanding. Denies further questions or needs at this time. Confirmed appt with Dr. Lindi Adie on 10/15.

## 2017-06-29 ENCOUNTER — Ambulatory Visit: Payer: 59 | Admitting: Hematology and Oncology

## 2017-06-29 NOTE — Addendum Note (Signed)
Addendum  created 06/29/17 1317 by Myrtie Soman, MD   Anesthesia Intra Blocks edited, Sign clinical note

## 2017-07-01 ENCOUNTER — Encounter: Payer: Self-pay | Admitting: General Practice

## 2017-07-01 ENCOUNTER — Encounter (HOSPITAL_BASED_OUTPATIENT_CLINIC_OR_DEPARTMENT_OTHER): Payer: Self-pay | Admitting: *Deleted

## 2017-07-01 NOTE — Progress Notes (Signed)
[place] [facility]ual [facility] Note  LVM of support and encouragement for [facility] per our plan to keep in touch by phone.  She knows to contact Support Team, as well, at any time with needs or questions.   [place], [place], [facility] Pager [phone] Voicemail [phone]

## 2017-07-02 ENCOUNTER — Encounter: Payer: Self-pay | Admitting: Physical Therapy

## 2017-07-02 ENCOUNTER — Ambulatory Visit: Payer: 59 | Attending: General Surgery | Admitting: Physical Therapy

## 2017-07-02 DIAGNOSIS — R293 Abnormal posture: Secondary | ICD-10-CM

## 2017-07-02 DIAGNOSIS — M25612 Stiffness of left shoulder, not elsewhere classified: Secondary | ICD-10-CM | POA: Insufficient documentation

## 2017-07-02 DIAGNOSIS — M25611 Stiffness of right shoulder, not elsewhere classified: Secondary | ICD-10-CM | POA: Diagnosis present

## 2017-07-02 DIAGNOSIS — Z483 Aftercare following surgery for neoplasm: Secondary | ICD-10-CM | POA: Diagnosis present

## 2017-07-02 DIAGNOSIS — M79602 Pain in left arm: Secondary | ICD-10-CM
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
## 2017-07-02 NOTE — Patient Instructions (Addendum)
Cane Exercise: External Rotation    Lie with elbows on surface, even with shoulders. Hold cane above chest, palms toward toes. Lower arms back as far as possible. Keep elbows on surface. Hold __3__ seconds. Repeat __10__ times. Do __2__ sessions per day.  http://gt2.exer.us/88   Copyright  VHI. All rights reserved.  Cane Exercise: Abduction    Hold cane with right hand over end, palm-up, with other hand palm-down. Move arm out from side and up by pushing with other arm. Hold __3__ seconds. Repeat __10__ times. Do _2___ sessions per day.  http://gt2.exer.us/82   Copyright  VHI. All rights reserved.  Cane Exercise: External Rotation    Lie with elbows on surface, even with shoulders. Hold cane above chest, palms toward toes. Lower arms back as far as possible. Keep elbows on surface. Hold __2__ seconds. Repeat __10__ times. Do __2__ sessions per day.  http://gt2.exer.us/88   Copyright  VHI. All rights reserved.

## 2017-07-02 NOTE — Progress Notes (Signed)
Ensure pre surgery drink given with instructions to complete by [id number] dos, hibliclens soap given with instructions, pt verbalized understanding.

## 2017-07-07 ENCOUNTER — Encounter (HOSPITAL_BASED_OUTPATIENT_CLINIC_OR_DEPARTMENT_OTHER): Payer: Self-pay | Admitting: Anesthesiology

## 2017-07-07 ENCOUNTER — Ambulatory Visit (HOSPITAL_BASED_OUTPATIENT_CLINIC_OR_DEPARTMENT_OTHER)
Admission: RE | Admit: 2017-07-07 | Discharge: 2017-07-07 | Disposition: A | Discharge status: Home / Self Care | Payer: 59 | Source: Ambulatory Visit | Attending: General Surgery | Admitting: General Surgery

## 2017-07-07 ENCOUNTER — Telehealth: Payer: Self-pay | Admitting: *Deleted

## 2017-07-07 ENCOUNTER — Encounter (HOSPITAL_BASED_OUTPATIENT_CLINIC_OR_DEPARTMENT_OTHER)
Admission: RE | Disposition: A | Discharge status: Home / Self Care | Payer: Self-pay | Source: Ambulatory Visit | Attending: General Surgery

## 2017-07-07 ENCOUNTER — Ambulatory Visit (HOSPITAL_BASED_OUTPATIENT_CLINIC_OR_DEPARTMENT_OTHER): Payer: 59 | Admitting: Anesthesiology

## 2017-07-07 DIAGNOSIS — Z887 Allergy status to serum and vaccine status: Secondary | ICD-10-CM | POA: Insufficient documentation

## 2017-07-07 DIAGNOSIS — C50911 Malignant neoplasm of unspecified site of right female breast: Secondary | ICD-10-CM | POA: Diagnosis present

## 2017-07-07 DIAGNOSIS — M1990 Unspecified osteoarthritis, unspecified site: Secondary | ICD-10-CM | POA: Insufficient documentation

## 2017-07-07 DIAGNOSIS — Z170 Estrogen receptor positive status [ER+]: Secondary | ICD-10-CM | POA: Diagnosis not present

## 2017-07-07 DIAGNOSIS — D241 Benign neoplasm of right breast: Secondary | ICD-10-CM | POA: Diagnosis not present

## 2017-07-07 DIAGNOSIS — N6021 Fibroadenosis of right breast: Secondary | ICD-10-CM | POA: Diagnosis not present

## 2017-07-07 DIAGNOSIS — K219 Gastro-esophageal reflux disease without esophagitis: Secondary | ICD-10-CM | POA: Diagnosis not present

## 2017-07-07 DIAGNOSIS — Z87891 Personal history of nicotine dependence: Secondary | ICD-10-CM | POA: Diagnosis not present

## 2017-07-07 DIAGNOSIS — Z888 Allergy status to other drugs, medicaments and biological substances status: Secondary | ICD-10-CM | POA: Diagnosis not present

## 2017-07-07 DIAGNOSIS — Z79899 Other long term (current) drug therapy: Secondary | ICD-10-CM | POA: Insufficient documentation

## 2017-07-07 DIAGNOSIS — C50412 Malignant neoplasm of upper-outer quadrant of left female breast: Secondary | ICD-10-CM | POA: Insufficient documentation

## 2017-07-07 DIAGNOSIS — D0511 Intraductal carcinoma in situ of right breast: Secondary | ICD-10-CM | POA: Insufficient documentation

## 2017-07-07 DIAGNOSIS — Z981 Arthrodesis status: Secondary | ICD-10-CM | POA: Diagnosis not present

## 2017-07-07 DIAGNOSIS — Z882 Allergy status to sulfonamides status: Secondary | ICD-10-CM | POA: Insufficient documentation

## 2017-07-07 DIAGNOSIS — Z803 Family history of malignant neoplasm of breast: Secondary | ICD-10-CM | POA: Diagnosis not present

## 2017-07-07 DIAGNOSIS — F329 Major depressive disorder, single episode, unspecified: Secondary | ICD-10-CM | POA: Diagnosis not present

## 2017-07-07 DIAGNOSIS — F419 Anxiety disorder, unspecified: Secondary | ICD-10-CM | POA: Insufficient documentation

## 2017-07-07 DIAGNOSIS — N6011 Diffuse cystic mastopathy of right breast: Secondary | ICD-10-CM | POA: Insufficient documentation

## 2017-07-07 HISTORY — PX: RE-EXCISION OF BREAST LUMPECTOMY: SHX6048

## 2017-07-07 SURGERY — EXCISION, LESION, BREAST
Anesthesia: General | Site: Breast | Laterality: Right

## 2017-07-07 MED ORDER — MIDAZOLAM HCL 2 MG/2ML IJ SOLN: 1.0000 mg | INTRAMUSCULAR | Status: DC | PRN

## 2017-07-07 MED ORDER — FENTANYL CITRATE (PF) 100 MCG/2ML IJ SOLN: 50.0000 ug | INTRAMUSCULAR | Status: DC | PRN

## 2017-07-07 MED ORDER — LIDOCAINE 2% (20 MG/ML) 5 ML SYRINGE
INTRAMUSCULAR | Status: AC
  Filled 2017-07-07: qty 5

## 2017-07-07 MED ORDER — GLYCOPYRROLATE 0.2 MG/ML IJ SOLN
INTRAMUSCULAR | Status: DC | PRN
  Administered 2017-07-07: 0.1 mg via INTRAVENOUS

## 2017-07-07 MED ORDER — HYDROMORPHONE HCL 1 MG/ML IJ SOLN
INTRAMUSCULAR | Status: AC
  Filled 2017-07-07: qty 0.5

## 2017-07-07 MED ORDER — DEXAMETHASONE SODIUM PHOSPHATE 4 MG/ML IJ SOLN
INTRAMUSCULAR | Status: DC | PRN
  Administered 2017-07-07: 10 mg via INTRAVENOUS

## 2017-07-07 MED ORDER — LACTATED RINGERS IV SOLN
INTRAVENOUS | Status: DC
  Administered 2017-07-07: 12:00:00 via INTRAVENOUS
  Administered 2017-07-07: 10 mL/h via INTRAVENOUS
  Administered 2017-07-07: 11:00:00 via INTRAVENOUS

## 2017-07-07 MED ORDER — ACETAMINOPHEN 500 MG PO TABS
1000.0000 mg | ORAL_TABLET | ORAL | Status: AC
  Administered 2017-07-07: 1000 mg via ORAL

## 2017-07-07 MED ORDER — HYDROMORPHONE HCL 1 MG/ML IJ SOLN
0.2500 mg | INTRAMUSCULAR | Status: DC | PRN
  Administered 2017-07-07 (×4): 0.5 mg via INTRAVENOUS

## 2017-07-07 MED ORDER — PROPOFOL 10 MG/ML IV BOLUS
INTRAVENOUS | Status: AC
  Filled 2017-07-07: qty 20

## 2017-07-07 MED ORDER — FENTANYL CITRATE (PF) 100 MCG/2ML IJ SOLN
INTRAMUSCULAR | Status: DC | PRN
  Administered 2017-07-07: 25 ug via INTRAVENOUS
  Administered 2017-07-07: 50 ug via INTRAVENOUS
  Administered 2017-07-07: 100 ug via INTRAVENOUS
  Administered 2017-07-07: 25 ug via INTRAVENOUS

## 2017-07-07 MED ORDER — CHLORHEXIDINE GLUCONATE CLOTH 2 % EX PADS: 6.0000 | MEDICATED_PAD | Freq: Once | CUTANEOUS | Status: DC

## 2017-07-07 MED ORDER — MIDAZOLAM HCL 5 MG/5ML IJ SOLN
INTRAMUSCULAR | Status: DC | PRN
  Administered 2017-07-07: 2 mg via INTRAVENOUS

## 2017-07-07 MED ORDER — ONDANSETRON HCL 4 MG/2ML IJ SOLN
INTRAMUSCULAR | Status: DC | PRN
  Administered 2017-07-07: 4 mg via INTRAVENOUS

## 2017-07-07 MED ORDER — ONDANSETRON HCL 4 MG/2ML IJ SOLN
INTRAMUSCULAR | Status: AC
  Filled 2017-07-07: qty 2

## 2017-07-07 MED ORDER — GABAPENTIN 300 MG PO CAPS
ORAL_CAPSULE | ORAL | Status: AC
  Filled 2017-07-07: qty 1

## 2017-07-07 MED ORDER — GABAPENTIN 300 MG PO CAPS
300.0000 mg | ORAL_CAPSULE | ORAL | Status: AC
  Administered 2017-07-07: 300 mg via ORAL

## 2017-07-07 MED ORDER — BUPIVACAINE HCL (PF) 0.25 % IJ SOLN
INTRAMUSCULAR | Status: DC | PRN
  Administered 2017-07-07: 10 mL

## 2017-07-07 MED ORDER — CEFAZOLIN SODIUM-DEXTROSE 2-4 GM/100ML-% IV SOLN
INTRAVENOUS | Status: AC
  Filled 2017-07-07: qty 100

## 2017-07-07 MED ORDER — PROPOFOL 10 MG/ML IV BOLUS
INTRAVENOUS | Status: DC | PRN
  Administered 2017-07-07: 200 mg via INTRAVENOUS

## 2017-07-07 MED ORDER — FENTANYL CITRATE (PF) 100 MCG/2ML IJ SOLN
INTRAMUSCULAR | Status: AC
  Filled 2017-07-07: qty 2

## 2017-07-07 MED ORDER — HYDROCODONE-ACETAMINOPHEN 10-325 MG PO TABS: 1.0000 | ORAL_TABLET | Freq: Four times a day (QID) | ORAL | 0 refills | Status: DC | PRN

## 2017-07-07 MED ORDER — EPHEDRINE SULFATE 50 MG/ML IJ SOLN
INTRAMUSCULAR | Status: DC | PRN
  Administered 2017-07-07: 10 mg via INTRAVENOUS

## 2017-07-07 MED ORDER — PROMETHAZINE HCL 25 MG/ML IJ SOLN: 6.2500 mg | INTRAMUSCULAR | Status: DC | PRN

## 2017-07-07 MED ORDER — DEXAMETHASONE SODIUM PHOSPHATE 10 MG/ML IJ SOLN
INTRAMUSCULAR | Status: AC
  Filled 2017-07-07: qty 1

## 2017-07-07 MED ORDER — MIDAZOLAM HCL 2 MG/2ML IJ SOLN
INTRAMUSCULAR | Status: AC
  Filled 2017-07-07: qty 2

## 2017-07-07 MED ORDER — SCOPOLAMINE 1 MG/3DAYS TD PT72: 1.0000 | MEDICATED_PATCH | Freq: Once | TRANSDERMAL | Status: DC | PRN

## 2017-07-07 MED ORDER — ACETAMINOPHEN 500 MG PO TABS
ORAL_TABLET | ORAL | Status: AC
  Filled 2017-07-07: qty 2

## 2017-07-07 MED ORDER — EPHEDRINE 5 MG/ML INJ
INTRAVENOUS | Status: AC
  Filled 2017-07-07: qty 10

## 2017-07-07 MED ORDER — CEFAZOLIN SODIUM-DEXTROSE 2-4 GM/100ML-% IV SOLN
2.0000 g | INTRAVENOUS | Status: AC
  Administered 2017-07-07: 2 g via INTRAVENOUS

## 2017-07-07 SURGICAL SUPPLY — 47 items
ADH SKN CLS APL DERMABOND .7 (GAUZE/BANDAGES/DRESSINGS) ×1
BINDER BREAST LRG (GAUZE/BANDAGES/DRESSINGS) ×2 IMPLANT
BLADE SURG 15 STRL LF DISP TIS (BLADE) ×1 IMPLANT
BLADE SURG 15 STRL SS (BLADE) ×3
CANISTER SUCT 1200ML W/VALVE (MISCELLANEOUS) ×3 IMPLANT
CHLORAPREP W/TINT 26ML (MISCELLANEOUS) ×3 IMPLANT
CLIP VESOCCLUDE SM WIDE 6/CT (CLIP) ×2 IMPLANT
CLOSURE WOUND 1/2 X4 (GAUZE/BANDAGES/DRESSINGS) ×1
COVER BACK TABLE 60X90IN (DRAPES) ×3 IMPLANT
COVER MAYO STAND STRL (DRAPES) ×3 IMPLANT
DERMABOND ADVANCED (GAUZE/BANDAGES/DRESSINGS) ×2
DERMABOND ADVANCED .7 DNX12 (GAUZE/BANDAGES/DRESSINGS) IMPLANT
DRAPE LAPAROSCOPIC ABDOMINAL (DRAPES) ×3 IMPLANT
DRAPE UTILITY XL STRL (DRAPES) ×3 IMPLANT
ELECT COATED BLADE 2.86 ST (ELECTRODE) ×3 IMPLANT
ELECT REM PT RETURN 9FT ADLT (ELECTROSURGICAL) ×3
ELECTRODE REM PT RTRN 9FT ADLT (ELECTROSURGICAL) ×1 IMPLANT
GAUZE SPONGE 4X4 12PLY STRL LF (GAUZE/BANDAGES/DRESSINGS) ×3 IMPLANT
GLOVE BIO SURGEON STRL SZ7 (GLOVE) ×5 IMPLANT
GLOVE BIOGEL PI IND STRL 7.0 (GLOVE) IMPLANT
GLOVE BIOGEL PI IND STRL 7.5 (GLOVE) ×1 IMPLANT
GLOVE BIOGEL PI INDICATOR 7.0 (GLOVE) ×4
GLOVE BIOGEL PI INDICATOR 7.5 (GLOVE) ×2
GOWN STRL REUS W/ TWL LRG LVL3 (GOWN DISPOSABLE) ×3 IMPLANT
GOWN STRL REUS W/TWL LRG LVL3 (GOWN DISPOSABLE) ×6
HEMOSTAT ARISTA ABSORB 3G PWDR (MISCELLANEOUS) ×2 IMPLANT
ILLUMINATOR WAVEGUIDE N/F (MISCELLANEOUS) ×2 IMPLANT
KIT MARKER MARGIN INK (KITS) ×2 IMPLANT
NDL HYPO 25X1 1.5 SAFETY (NEEDLE) ×1 IMPLANT
NEEDLE HYPO 25X1 1.5 SAFETY (NEEDLE) ×3 IMPLANT
NS IRRIG 1000ML POUR BTL (IV SOLUTION) ×2 IMPLANT
PACK BASIN DAY SURGERY FS (CUSTOM PROCEDURE TRAY) ×3 IMPLANT
PENCIL BUTTON HOLSTER BLD 10FT (ELECTRODE) ×3 IMPLANT
SLEEVE SCD COMPRESS KNEE MED (MISCELLANEOUS) ×3 IMPLANT
SPONGE LAP 4X18 X RAY DECT (DISPOSABLE) ×3 IMPLANT
STRIP CLOSURE SKIN 1/2X4 (GAUZE/BANDAGES/DRESSINGS) ×1 IMPLANT
SUT MNCRL AB 4-0 PS2 18 (SUTURE) ×2 IMPLANT
SUT VIC AB 2-0 SH 27 (SUTURE) ×3
SUT VIC AB 2-0 SH 27XBRD (SUTURE) ×1 IMPLANT
SUT VIC AB 3-0 SH 27 (SUTURE) ×3
SUT VIC AB 3-0 SH 27X BRD (SUTURE) ×1 IMPLANT
SYR CONTROL 10ML LL (SYRINGE) ×3 IMPLANT
TOWEL OR 17X24 6PK STRL BLUE (TOWEL DISPOSABLE) ×3 IMPLANT
TOWEL OR NON WOVEN STRL DISP B (DISPOSABLE) ×3 IMPLANT
TUBE CONNECTING 20'X1/4 (TUBING) ×1
TUBE CONNECTING 20X1/4 (TUBING) ×2 IMPLANT
YANKAUER SUCT BULB TIP NO VENT (SUCTIONS) ×3 IMPLANT

## 2017-07-07 NOTE — Transfer of Care (Signed)
Immediate Anesthesia Transfer of Care Note  Patient: Margaret Rojas  Procedure(s) Performed: RE-EXCISION OF RIGHT BREAST LUMPECTOMY (Right Breast)  Patient Location: PACU  Anesthesia Type:General  Level of Consciousness: awake and patient cooperative  Airway & Oxygen Therapy: Patient Spontanous Breathing and Patient connected to face mask oxygen  Post-op Assessment: Report given to RN and Post -op Vital signs reviewed and stable  Post vital signs: Reviewed and stable  Last Vitals:  Vitals:   07/07/17 1039  BP: 105/60  Pulse: 100  Resp: 18  Temp: 36.5 C  SpO2: 100%    Last Pain:  Vitals:   07/07/17 1039  TempSrc: Oral         Complications: No apparent anesthesia complications

## 2017-07-07 NOTE — H&P (View-Only) (Signed)
41 yof referred by Dr Loura Pardon for newly diagnosed left breast cancer. she has recently had spinal fusion with Dr Saintclair Halsted. she felt a left breast mass in June but delayed evaluation until after spinal fusion. she had a left breast mass noted on mammogram. this is an uoq distortion. she has c density breasts. on [place] this is a 1.1x0.8x0.7 cm. US of the node has a 3.5 mm cortex of one node. biopsy of the node is negative. biopsy was done and shows grade I-II IDC with DCIS, er/pr pos, her2 negative, Ki is 20%. she has two prior fa removed from left side in past. she has no fh of breast and ovarian cancer. she underwent mri and had known biopsy proven malignancy within the upper outer left breast. there is indeterminate area of nme at 12 oclock in the left breast subareolar position. there is an indeterminate area of nme without outer,upper right breast and there is mass in lower, outer right breast. all these have been biopsied. the two areas of nme are concordant and fcc with adenosis. the mass is idc that is er/pr positive, Ki of 3%. she has just seen plastic surgery as well. her genetics are negative  Past Surgical History Breast Biopsy  Left. multiple Foot Surgery  Left. Hysterectomy (not due to cancer) - Partial  Knee Surgery  Right. Oral Surgery  Shoulder Surgery  Right. Spinal Surgery - Lower Back   Diagnostic Studies History  Colonoscopy  never Mammogram  within last year Pap Smear  1-5 years ago  Medication History Medications Reconciled  Social History Alcohol use  Occasional alcohol use. No caffeine use  No drug use  Tobacco use  Former smoker.  Family History  Anesthetic complications  Father. Arthritis  Family Members In General, Father, Mother. Breast Cancer  Family Members In General. Cerebrovascular Accident  Family Members In [place] Members In General. Hypertension  Father. Kidney Disease  Family Members In  General. Melanoma  Father, Mother. Migraine Headache  Mother. Respiratory Condition  Family Members In General.  Pregnancy / Birth History  Age at menarche  50 years. Contraceptive History  Intrauterine device, Oral contraceptives. Gravida  2 Irregular periods  Length (months) of breastfeeding  3-6 Maternal age  22-30 Para  2  Other Problems  Anxiety Disorder  Arthritis  Back Pain  Breast Cancer  Depression  Gastroesophageal Reflux Disease  Lump In Breast  Migraine Headache   Review of Systems  General Present- Appetite Loss, Fatigue and Night Sweats. Not Present- Chills, Fever, Weight Gain and Weight Loss. Skin Not Present- Change in Wart/Mole, Dryness, Hives, Jaundice, New Lesions, Non-Healing Wounds, Rash and Ulcer. HEENT Present- Hoarseness, Ringing in the Ears, Sore Throat and Wears glasses/contact lenses. Not Present- Earache, Hearing Loss, Nose Bleed, Oral Ulcers, Seasonal Allergies, Sinus Pain, Visual Disturbances and Yellow Eyes. Respiratory Not Present- Bloody sputum, Chronic Cough, Difficulty Breathing, Snoring and Wheezing. Breast Present- Breast Mass and Breast Pain. Not Present- Nipple Discharge and Skin Changes. Cardiovascular Present- Leg Cramps and Swelling of Extremities. Not Present- Chest Pain, Difficulty Breathing Lying Down, Palpitations, Rapid Heart Rate and Shortness of Breath. Gastrointestinal Present- Difficulty Swallowing. Not Present- Abdominal Pain, Bloating, Bloody Stool, Change in Bowel Habits, Chronic diarrhea, Constipation, Excessive gas, Gets full quickly at meals, Hemorrhoids, Indigestion, Nausea, Rectal Pain and Vomiting. Female Genitourinary Not Present- Frequency, Nocturia, Painful Urination, Pelvic Pain and Urgency. Musculoskeletal Present- Back Pain, Joint Pain and Muscle Pain. Not Present- Joint Stiffness, Muscle Weakness and  Swelling of Extremities. Neurological Present- Headaches, Numbness and Tingling. Not Present-  Decreased Memory, Fainting, Seizures, Tremor, Trouble walking and Weakness. Psychiatric Present- Anxiety, Change in Sleep Pattern, Depression and Fearful. Not Present- Bipolar and Frequent crying. Endocrine Present- Hair Changes. Not Present- Cold Intolerance, Excessive Hunger, Heat Intolerance, Hot flashes and New Diabetes. Hematology Present- Easy Bruising. Not Present- Blood Thinners, Excessive bleeding, Gland problems, HIV and Persistent Infections.   Physical Exam  General Mental Status-Alert. Orientation-Oriented X3. Head and Neck Trachea-midline. Thyroid Gland Characteristics - normal size and consistency. Chest and Lung Exam Chest and lung exam reveals -quiet, even and easy respiratory effort with no use of accessory muscles and on auscultation, normal breath sounds, no adventitious sounds and normal vocal resonance. Breast Nipples-No Discharge. Note: left uoq 1cm mass mobile nontender Cardiovascular Cardiovascular examination reveals -normal heart sounds, regular rate and rhythm with no murmurs. Abdomen Note: soft nt/nd Lymphatic Head & Neck General Head & Neck Lymphatics: Bilateral - Description - Normal. Axillary General Axillary Region: Bilateral - Description - Normal. Note: no [place] adenopathy   Assessment & Plan Rolm Bookbinder MD; 06/09/2017 4:23 PM) BILATERAL BREAST CANCER (C50.911) Story: discussed mri found right breast cancer. she saw plastics today to understand about reconstruction after mastectomy. I think if we decided mastectomies would need to stay on tamoxfen for a time due to spinal fusion recovery. I still think bilateral lumpectomy/sn biopsy with radiation would be equivalent to bilateral mastectomy in terms of local recurrence and survival. her genetic panel was negative although I do have some concern given her age, general good health and her bilateral cancers. she is going to consider options but is leaning towards bilateral bct.  Will  proceed with bilateral lump/sn today

## 2017-07-07 NOTE — Anesthesia Preprocedure Evaluation (Signed)
Anesthesia Evaluation  Patient identified by MRN, date of birth, ID band Patient awake    Reviewed: Allergy & Precautions, NPO status , Patient's Chart, lab work & pertinent test results  Airway Mallampati: II  TM Distance: >3 FB Neck ROM: Full    Dental no notable dental hx.    Pulmonary neg pulmonary ROS, Current Smoker, former smoker,    Pulmonary exam normal breath sounds clear to auscultation       Cardiovascular negative cardio ROS Normal cardiovascular exam Rhythm:Regular Rate:Normal     Neuro/Psych  Headaches, Anxiety Depression negative neurological ROS  negative psych ROS   GI/Hepatic Neg liver ROS, GERD  ,  Endo/Other  negative endocrine ROS  Renal/GU negative Renal ROS  negative genitourinary   Musculoskeletal negative musculoskeletal ROS (+)   Abdominal   Peds negative pediatric ROS (+)  Hematology negative hematology ROS (+)   Anesthesia Other Findings   Reproductive/Obstetrics negative OB ROS                             Lab Results  Component Value Date   WBC 8.6 06/19/2017   HGB 12.5 06/19/2017   HCT 39.8 06/19/2017   MCV 93.0 06/19/2017   PLT 217 06/19/2017   Lab Results  Component Value Date   CREATININE 0.67 06/19/2017   BUN 13 06/19/2017   NA 135 06/19/2017   K 3.6 06/19/2017   CL 110 06/19/2017   CO2 22 06/19/2017    Anesthesia Physical  Anesthesia Plan  ASA: II  Anesthesia Plan: General   Post-op Pain Management:    Induction: Intravenous  PONV Risk Score and Plan: 3 and Ondansetron, Dexamethasone, Midazolam and Scopolamine patch - Pre-op  Airway Management Planned: Oral ETT and LMA  Additional Equipment:   Intra-op Plan:   Post-operative Plan: Extubation in OR  Informed Consent: I have reviewed the patients History and Physical, chart, labs and discussed the procedure including the risks, benefits and alternatives for the proposed  anesthesia with the patient or authorized representative who has indicated his/her understanding and acceptance.   Dental advisory given  Plan Discussed with: CRNA and Surgeon  Anesthesia Plan Comments:         Anesthesia Quick Evaluation

## 2017-07-07 NOTE — Interval H&P Note (Signed)
History and Physical Interval Note:  07/07/2017 10:56 AM Needs reexcision of right breast anterior and lateral margins.  Margaret Rojas  has presented today for surgery, with the diagnosis of RIGHT BREAST CANCER WITH POSITIVE MARGINS  The various methods of treatment have been discussed with the patient and family. After consideration of risks, benefits and other options for treatment, the patient has consented to  Procedure(s): RE-EXCISION OF RIGHT BREAST LUMPECTOMY (Right) as a surgical intervention .  The patient's history has been reviewed, patient examined, no change in status, stable for surgery.  I have reviewed the patient's chart and labs.  Questions were answered to the patient's satisfaction.     Trenise Turay

## 2017-07-07 NOTE — Discharge Instructions (Signed)
[facility] °Office Phone Number [phone] ° °POST OP INSTRUCTIONS ° °Always review your discharge instruction sheet given to you by the facility where your surgery was performed. ° °IF YOU HAVE DISABILITY OR FAMILY LEAVE FORMS, YOU MUST BRING THEM TO THE OFFICE FOR PROCESSING.  DO NOT GIVE THEM TO YOUR DOCTOR. ° °1. A prescription for pain medication may be given to you upon discharge.  Take your pain medication as prescribed, if needed.  If narcotic pain medicine is not needed, then you may take acetaminophen (Tylenol), naprosyn (Alleve) or ibuprofen (Advil) as needed. °2. Take your usually prescribed medications unless otherwise directed °3. If you need a refill on your pain medication, please contact your pharmacy.  They will contact our office to request authorization.  Prescriptions will not be filled after 5pm or on week-ends. °4. You should eat very light the first 24 hours after surgery, such as soup, crackers, pudding, etc.  Resume your normal diet the day after surgery. °5. Most patients will experience some swelling and bruising in the breast.  Ice packs and a good support bra will help.  Wear the breast binder provided or a sports bra for 72 hours day and night.  After that wear a sports bra during the day until you return to the office. Swelling and bruising can take several days to resolve.  °6. It is common to experience some constipation if taking pain medication after surgery.  Increasing fluid intake and taking a stool softener will usually help or prevent this problem from occurring.  A mild laxative (Milk of Magnesia or Miralax) should be taken according to package directions if there are no bowel movements after 48 hours. °7. Unless discharge instructions indicate otherwise, you may remove your bandages 48 hours after surgery and you may shower at that time.  You may have steri-strips (small skin tapes) in place directly over the incision.  These strips should be left on the  skin for 7-10 days and will come off on their own.  If your surgeon used skin glue on the incision, you may shower in 24 hours.  The glue will flake off over the next 2-3 weeks.  Any sutures or staples will be removed at the office during your follow-up visit. °8. ACTIVITIES:  You may resume regular daily activities (gradually increasing) beginning the next day.  Wearing a good support bra or sports bra minimizes pain and swelling.  You may have sexual intercourse when it is comfortable. °a. You may drive when you no longer are taking prescription pain medication, you can comfortably wear a seatbelt, and you can safely maneuver your car and apply brakes. °b. RETURN TO WORK:  ______________________________________________________________________________________ °9. You should see your doctor in the office for a follow-up appointment approximately two weeks after your surgery.  Your doctor’s nurse will typically make your follow-up appointment when she calls you with your pathology report.  Expect your pathology report 3-4 business days after your surgery.  You may call to check if you do not hear from us after three days. °10. OTHER INSTRUCTIONS: _______________________________________________________________________________________________ _____________________________________________________________________________________________________________________________________ °_____________________________________________________________________________________________________________________________________ °_____________________________________________________________________________________________________________________________________ ° °WHEN TO CALL DR WAKEFIELD: °1. Fever over 101.0 °2. Nausea and/or vomiting. °3. Extreme swelling or bruising. °4. Continued bleeding from incision. °5. Increased pain, redness, or drainage from the incision. ° °The clinic staff is available to answer your questions during regular  business hours.  Please don’t hesitate to call and ask to speak to one of the nurses for clinical concerns.  If   you have a medical emergency, go to the nearest emergency room or call 911.  A surgeon from [facility] is always on call at the hospital. ° °For further questions, please visit [email or web address] ° ° ° ° ° °Post Anesthesia Home Care Instructions ° °Activity: °Get plenty of rest for the remainder of the day. A responsible individual must stay with you for 24 hours following the procedure.  °For the next 24 hours, DO NOT: °-Drive a car °-Operate machinery °-Drink alcoholic beverages °-Take any medication unless instructed by your physician °-Make any legal decisions or sign important papers. ° °Meals: °Start with liquid foods such as gelatin or soup. Progress to regular foods as tolerated. Avoid greasy, spicy, heavy foods. If nausea and/or vomiting occur, drink only clear liquids until the nausea and/or vomiting subsides. Call your physician if vomiting continues. ° °Special Instructions/Symptoms: °Your throat may feel dry or sore from the anesthesia or the breathing tube placed in your throat during surgery. If this causes discomfort, gargle with warm salt water. The discomfort should disappear within 24 hours. ° °If you had a scopolamine patch placed behind your ear for the management of post- operative nausea and/or vomiting: ° °1. The medication in the patch is effective for 72 hours, after which it should be removed.  Wrap patch in a tissue and discard in the trash. Wash hands thoroughly with soap and water. °2. You may remove the patch earlier than 72 hours if you experience unpleasant side effects which may include dry mouth, dizziness or visual disturbances. °3. Avoid touching the patch. Wash your hands with soap and water after contact with the patch. °  ° °

## 2017-07-07 NOTE — Anesthesia Procedure Notes (Signed)
Procedure Name: LMA Insertion Date/Time: 07/07/2017 11:22 AM Performed by: Toula Moos L Pre-anesthesia Checklist: Patient identified, Emergency Drugs available, Suction available, Patient being monitored and Timeout performed Patient Re-evaluated:Patient Re-evaluated prior to induction Oxygen Delivery Method: Circle system utilized Preoxygenation: Pre-oxygenation with 100% oxygen Induction Type: IV induction Ventilation: Mask ventilation without difficulty LMA: LMA inserted LMA Size: 4.0 Number of attempts: 1 Airway Equipment and Method: Bite block Placement Confirmation: positive ETCO2 Tube secured with: Tape Dental Injury: Teeth and Oropharynx as per pre-operative assessment

## 2017-07-07 NOTE — Op Note (Signed)
Preoperative diagnosis: Bilateral breast cancer status post lumpectomy and sentinel node, right breast lumpectomy with positive anterior and lateral margins Postoperative diagnosis: Same as above Procedure: Reexcision of anterior and lateral margins right breast Surgeon: Dr. Serita Grammes Anesthesia: General Estimated blood loss: 20 cc Specimens: 1.  Right lateral margin marked with paint 2.  Right anterior margin marked with paint Complications: None Drains: None Sponge needle count was correct at completion Disposition to recovery in stable condition  Indications: This is a 41 year old female who has undergone bilateral lumpectomies and sentinel node biopsies for bilateral breast cancers.  On the right side her anterior and lateral margins are positive.  We discussed returning to the operating room for excisions of margins.  Procedure: After informed consent was obtained the patient was taken to the operating room.  She was given Ancef.  SCDs were in place.  She was then placed under general anesthesia without complication.  She was prepped and draped in the standard sterile surgical fashion.  A surgical timeout was then performed.  I reentered her old incision.  I infiltrated Marcaine throughout this area.  I then released all the sutures where I  had reattached her breast tissue.  I identified the lateral margin and used cautery to excise this.  This all appeared normal to my eyes.  I marked this with paint and passed this off the table.  I then identified the anterior margin and excised this in a similar fashion.  This was also marked with paint.  I then obtained hemostasis.  I placed some Arista in the cavity.  I closed the breast tissue down to the muscle with 2-0 Vicryl.  The dermis was closed with 3-0 Vicryl and the skin with 4-0 Monocryl.  Glue and Steri-Strips were placed.  She tolerated this well and was transferred to recovery.

## 2017-07-07 NOTE — Telephone Encounter (Signed)
Received oncotype for Left breast cancer of 17/11%. Physician team notified.

## 2017-07-07 NOTE — Anesthesia Postprocedure Evaluation (Signed)
Anesthesia Post Note  Patient: Margaret Rojas  Procedure(s) Performed: RE-EXCISION OF RIGHT BREAST LUMPECTOMY (Right Breast)     Patient location during evaluation: PACU Anesthesia Type: General Level of consciousness: awake and alert Pain management: pain level controlled Vital Signs Assessment: post-procedure vital signs reviewed and stable Respiratory status: spontaneous breathing, nonlabored ventilation, respiratory function stable and patient connected to nasal cannula oxygen Cardiovascular status: blood pressure returned to baseline and stable Postop Assessment: no apparent nausea or vomiting Anesthetic complications: no    Last Vitals:  Vitals:   07/07/17 1315 07/07/17 1330  BP: 101/66 103/73  Pulse: 75 85  Resp: 15 12  Temp:    SpO2: 97% 97%    Last Pain:  Vitals:   07/07/17 1330  TempSrc:   PainSc: 4                  Tiajuana Amass

## 2017-07-08 ENCOUNTER — Encounter: Payer: Self-pay | Admitting: Hematology and Oncology

## 2017-07-08 ENCOUNTER — Encounter (HOSPITAL_BASED_OUTPATIENT_CLINIC_OR_DEPARTMENT_OTHER): Payer: Self-pay | Admitting: General Surgery

## 2017-07-13 ENCOUNTER — Ambulatory Visit (HOSPITAL_BASED_OUTPATIENT_CLINIC_OR_DEPARTMENT_OTHER): Payer: 59 | Admitting: Hematology and Oncology

## 2017-07-13 ENCOUNTER — Other Ambulatory Visit (HOSPITAL_BASED_OUTPATIENT_CLINIC_OR_DEPARTMENT_OTHER): Payer: 59

## 2017-07-13 DIAGNOSIS — C50412 Malignant neoplasm of upper-outer quadrant of left female breast: Secondary | ICD-10-CM

## 2017-07-13 DIAGNOSIS — Z170 Estrogen receptor positive status [ER+]: Secondary | ICD-10-CM

## 2017-07-13 DIAGNOSIS — Z23 Encounter for immunization: Secondary | ICD-10-CM

## 2017-07-13 MED ORDER — INFLUENZA VAC SPLIT QUAD 0.5 ML IM SUSY
0.5000 mL | PREFILLED_SYRINGE | Freq: Once | INTRAMUSCULAR | Status: AC
  Administered 2017-07-13: 0.5 mL via INTRAMUSCULAR
  Filled 2017-07-13: qty 0.5

## 2017-07-13 NOTE — Progress Notes (Signed)
Patient Care Team: Tower, Wynelle Fanny, MD as PCP - General (Family Medicine) Rolm Bookbinder, MD as Consulting Physician (General Surgery) Nicholas Lose, MD as Consulting Physician (Hematology and Oncology) Kyung Rudd, MD as Consulting Physician (Radiation Oncology)  DIAGNOSIS:  Encounter Diagnosis  Name Primary?  . Malignant neoplasm of upper-outer quadrant of left breast in female, estrogen receptor positive ([place])     SUMMARY OF ONCOLOGIC HISTORY:   Malignant neoplasm of upper-outer quadrant of left breast in female, estrogen receptor positive ([place])   05/13/2017 Initial Diagnosis    Palpable left breast mass with distortion at 1:30 position: 1.1 cm with left axillary lymph node, 3.5 mm cortex, biopsy tubular 05/21/2017. Biopsy of the breast mass revealed grade 1-2 IDC with DCIS ER 95%, PR 95%, Ki-67 20%, HER-2 negative ratio 1.32 T1c N0 stage IA      05/26/2017 Genetic Testing    Negative genetic testing on the 9-gene STAT panel.The STAT Breast cancer panel offered by [facility] includes sequencing and rearrangement analysis for the following 9 genes:  ATM, BRCA1, BRCA2, CDH1, CHEK2, PALB2, PTEN, STK11 and TP53.   The report date is 05/26/2017.  Negative genetic testing on the reflexed common hereditary cancer panel.  The Hereditary Gene Panel offered by [facility] includes sequencing and/or deletion duplication testing of the following 46 genes: APC, ATM, AXIN2, BARD1, BMPR1A, BRCA1, BRCA2, BRIP1, CDH1, CDKN2A (p14ARF), CDKN2A (p16INK4a), CHEK2, CTNNA1, DICER1, EPCAM (Deletion/duplication testing only), GREM1 (promoter region deletion/duplication testing only), KIT, MEN1, MLH1, MSH2, MSH3, MSH6, MUTYH, NBN, NF1, NHTL1, PALB2, PDGFRA, PMS2, POLD1, POLE, PTEN, RAD50, RAD51C, RAD51D, SDHB, SDHC, SDHD, SMAD4, SMARCA4. STK11, TP53, TSC1, TSC2, and VHL.  The following genes were evaluated for sequence changes only: SDHA and HOXB13 c.251G>A variant only.  The report date is May 26, 2017.      06/19/2017 Surgery    Left lumpectomy: IDC with DCIS, 2.1 cm, 0/9 lymph nodes negative margins negative; ER 95%, PR 95%, HER-2 negative ratio 1.32, Ki-67 20%, T2N0 stage Ib; Right lumpectomy: IDC with DCIS 1.8 cm, focally involving lateral and anterior margins, 0/3 lymph nodes negative, ER 100%, PR 20%, HER-2 negative ratio 1.23, Ki-67 3%, T1CN0 stage I a      06/19/2017 Oncotype testing    Oncotype DX score 17: Risk of recurrence 11% with tamoxifen alone      07/07/2017 Surgery    Reexcision lateral margin: Residual DCIS intermediate grade, reexcision anterior margin: ALH       CHIEF COMPLIANT: Follow-up to discuss results of surgery as well as Oncotype DX  INTERVAL HISTORY: Margaret Rojas is a 41 year old with above-mentioned history of bilateral lumpectomies for bilateral breast cancers.  She underwent reresection of the margins and is here today to discuss the final pathology reports as well as the Oncotype DX testing.  She has recovered extremely well from the surgery.  REVIEW OF SYSTEMS:   Constitutional: Denies fevers, chills or abnormal weight loss Eyes: Denies blurriness of vision Ears, nose, mouth, throat, and face: Denies mucositis or sore throat Respiratory: Denies cough, dyspnea or wheezes Cardiovascular: Denies palpitation, chest discomfort Gastrointestinal:  Denies nausea, heartburn or change in bowel habits Skin: Denies abnormal skin rashes Lymphatics: Denies new lymphadenopathy or easy bruising Neurological:Denies numbness, tingling or new weaknesses Behavioral/Psych: Mood is stable, no new changes  Extremities: No lower extremity edema Breast: Bilateral lumpectomies All other systems were reviewed with the patient and are negative.  I have reviewed the past medical history, past surgical history, social history and family history with  the patient and they are unchanged from previous note.  ALLERGIES:  is allergic to pneumovax [pneumococcal polysaccharide  vaccine]; sulfonamide derivatives; and epinephrine.  MEDICATIONS:  Current Outpatient Prescriptions  Medication Sig Dispense Refill  . acetaminophen (TYLENOL) 500 MG tablet Take 1,500 mg by mouth 3 (three) times daily as needed for mild pain.     Marland Kitchen ALPRAZolam (XANAX) 1 MG tablet Take 1 mg by mouth daily as needed for anxiety.    . cyclobenzaprine (FLEXERIL) 10 MG tablet Take 10 mg by mouth 2 (two) times daily as needed for muscle spasms (migraines).     Marland Kitchen HYDROcodone-acetaminophen (NORCO) 10-325 MG tablet Take 1 tablet by mouth every 6 (six) hours as needed. 15 tablet 0  . HYDROcodone-acetaminophen (NORCO) 7.5-325 MG tablet Take 1 tablet by mouth every 6 (six) hours as needed for pain.    Marland Kitchen levonorgestrel (MIRENA, 52 MG,) 20 AYT/01SW IUD 1 application by Intrauterine route.    . Multiple Vitamins-Minerals (EMERGEN-C VITAMIN C) PACK Take 1 packet by mouth daily.    . Pediatric Multivit-Minerals-C (CHILDRENS GUMMIES) CHEW Chew 2 each by mouth daily.    . sertraline (ZOLOFT) 100 MG tablet Take 1 tablet (100 mg total) by mouth daily. 30 tablet 11  . tamoxifen (NOLVADEX) 20 MG tablet TAKE 1 TABLET BY MOUTH EVERY DAY 30 tablet 0  . tiZANidine (ZANAFLEX) 2 MG tablet Take 1-4 mg by mouth every 8 hours when necessary pain and spasm 30 tablet 0  . topiramate (TOPAMAX) 100 MG tablet Take 100 mg by mouth at bedtime.     Marland Kitchen zolpidem (AMBIEN) 10 MG tablet Take 1 tablet (10 mg total) by mouth at bedtime as needed. for sleep 30 tablet 3   No current facility-administered medications for this visit.     PHYSICAL EXAMINATION: ECOG PERFORMANCE STATUS: 1 - Symptomatic but completely ambulatory  Vitals:   07/13/17 0842  BP: 134/87  Pulse: 75  Resp: 20  Temp: 97.7 F (36.5 C)  SpO2: 100%   Filed Weights   07/13/17 0842  Weight: 196 lb 6.4 oz (89.1 kg)    GENERAL:alert, no distress and comfortable SKIN: skin color, texture, turgor are normal, no rashes or significant lesions EYES: normal,  Conjunctiva are pink and non-injected, sclera clear OROPHARYNX:no exudate, no erythema and lips, buccal mucosa, and tongue normal  NECK: supple, thyroid normal size, non-tender, without nodularity LYMPH:  no palpable lymphadenopathy in the cervical, axillary or inguinal LUNGS: clear to auscultation and percussion with normal breathing effort HEART: regular rate & rhythm and no murmurs and no lower extremity edema ABDOMEN:abdomen soft, non-tender and normal bowel sounds MUSCULOSKELETAL:no cyanosis of digits and no clubbing  NEURO: alert & oriented x 3 with fluent speech, no focal motor/sensory deficits EXTREMITIES: No lower extremity edema   LABORATORY DATA:  I have reviewed the data as listed   Chemistry      Component Value Date/Time   NA 135 06/19/2017 0646   NA 140 05/20/2017 1219   K 3.6 06/19/2017 0646   K 3.5 05/20/2017 1219   CL 110 06/19/2017 0646   CO2 22 06/19/2017 0646   CO2 23 05/20/2017 1219   BUN 13 06/19/2017 0646   BUN 13.7 05/20/2017 1219   CREATININE 0.67 06/19/2017 0646   CREATININE 1.0 05/20/2017 1219      Component Value Date/Time   CALCIUM 8.1 (L) 06/19/2017 0646   CALCIUM 9.4 05/20/2017 1219   ALKPHOS 62 05/20/2017 1219   AST 15 05/20/2017 1219   ALT 12  05/20/2017 1219   BILITOT 0.48 05/20/2017 1219       Lab Results  Component Value Date   WBC 8.6 06/19/2017   HGB 12.5 06/19/2017   HCT 39.8 06/19/2017   MCV 93.0 06/19/2017   PLT 217 06/19/2017   NEUTROABS 4.0 05/20/2017    ASSESSMENT & PLAN:  Malignant neoplasm of upper-outer quadrant of left breast in female, estrogen receptor positive ([place]) 06/19/2017: Left lumpectomy: IDC with DCIS, 2.1 cm, 0/9 lymph nodes negative margins negative; ER 95%, PR 95%, HER-2 negative ratio 1.32, Ki-67 20%, T2N0 stage Ib;  Right lumpectomy: IDC with DCIS 1.8 cm, focally involving lateral and anterior margins, 0/3 lymph nodes negative, reexcision anterior and lateral margins DCIS, ER 100%, PR 20%, HER-2  negative ratio 1.23, Ki-67 3%, T1CN0 stage I a  Pathology counseling: I discussed the final pathology report of the patient provided  a copy of this report. I discussed the margins as well as lymph node surgeries. We also discussed the final staging along with previously performed ER/PR and HER-2/neu testing.  Oncotype DX recurrence score 17: Risk of recurrence 11% with tamoxifen alone There was not enough material to send Oncotype on the right breast.  I discussed with her extensively regarding margins.  We will be discussing her case in the tumor board this Wednesday. To me it appears to be that the margins are clear and that she does not need to undergo any further surgery.  Recommendation: 1.  Adjuvant radiation therapy followed by 2. adjuvant antiestrogen therapy with tamoxifen  Return to clinic at the end of radiation to start antiestrogen therapy with tamoxifen.  When she took tamoxifen preoperatively, she appeared to have tolerated it well except for hot flashes.   I spent 25 minutes talking to the patient of which more than half was spent in counseling and coordination of care.  No orders of the defined types were placed in this encounter.  The patient has a good understanding of the overall plan. she agrees with it. she will call with any problems that may develop before the next visit here.   Rulon Eisenmenger, MD 07/13/17

## 2017-07-13 NOTE — Progress Notes (Signed)
Pt received flu shot on her R gluteal muscle d/t recent bilateral lumpectomy.

## 2017-07-13 NOTE — Assessment & Plan Note (Signed)
06/19/2017: Left lumpectomy: IDC with DCIS, 2.1 cm, 0/9 lymph nodes negative margins negative; ER 95%, PR 95%, HER-2 negative ratio 1.32, Ki-67 20%, T2N0 stage Ib;  Right lumpectomy: IDC with DCIS 1.8 cm, focally involving lateral and anterior margins, 0/3 lymph nodes negative, reexcision anterior and lateral margins DCIS, ER 100%, PR 20%, HER-2 negative ratio 1.23, Ki-67 3%, T1CN0 stage I a  Pathology counseling: I discussed the final pathology report of the patient provided  a copy of this report. I discussed the margins as well as lymph node surgeries. We also discussed the final staging along with previously performed ER/PR and HER-2/neu testing.  Oncotype DX recurrence score 17: Risk of recurrence 11% with tamoxifen alone  Recommendation: 1.  Adjuvant radiation therapy followed by 2. adjuvant antiestrogen therapy with tamoxifen  Return to clinic at the end of radiation to start antiestrogens.

## 2017-07-15 ENCOUNTER — Other Ambulatory Visit: Payer: Self-pay | Admitting: *Deleted

## 2017-07-15 ENCOUNTER — Ambulatory Visit: Payer: 59 | Admitting: Physical Therapy

## 2017-07-15 DIAGNOSIS — C50412 Malignant neoplasm of upper-outer quadrant of left female breast: Secondary | ICD-10-CM

## 2017-07-15 DIAGNOSIS — Z170 Estrogen receptor positive status [ER+]: Principal | ICD-10-CM

## 2017-07-17 ENCOUNTER — Encounter: Payer: Self-pay | Admitting: Physical Therapy

## 2017-07-17 ENCOUNTER — Ambulatory Visit: Payer: 59 | Admitting: Family Medicine

## 2017-07-17 ENCOUNTER — Ambulatory Visit: Payer: 59 | Attending: General Surgery | Admitting: Physical Therapy

## 2017-07-17 DIAGNOSIS — M25612 Stiffness of left shoulder, not elsewhere classified: Secondary | ICD-10-CM | POA: Diagnosis not present

## 2017-07-17 DIAGNOSIS — M25611 Stiffness of right shoulder, not elsewhere classified: Secondary | ICD-10-CM | POA: Diagnosis present

## 2017-07-17 DIAGNOSIS — R293 Abnormal posture: Secondary | ICD-10-CM | POA: Insufficient documentation

## 2017-07-17 DIAGNOSIS — Z483 Aftercare following surgery for neoplasm: Secondary | ICD-10-CM | POA: Insufficient documentation

## 2017-07-17 DIAGNOSIS — M79602 Pain in left arm: Secondary | ICD-10-CM | POA: Diagnosis present
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
## 2017-07-20 ENCOUNTER — Ambulatory Visit: Payer: 59 | Admitting: Physical Therapy

## 2017-07-20 ENCOUNTER — Encounter (HOSPITAL_COMMUNITY): Payer: Self-pay

## 2017-07-20 ENCOUNTER — Encounter: Payer: Self-pay | Admitting: Physical Therapy

## 2017-07-20 DIAGNOSIS — M25611 Stiffness of right shoulder, not elsewhere classified: Secondary | ICD-10-CM

## 2017-07-20 DIAGNOSIS — Z483 Aftercare following surgery for neoplasm: Secondary | ICD-10-CM

## 2017-07-20 DIAGNOSIS — M79602 Pain in left arm: Secondary | ICD-10-CM

## 2017-07-20 DIAGNOSIS — M25612 Stiffness of left shoulder, not elsewhere classified: Secondary | ICD-10-CM

## 2017-07-20 DIAGNOSIS — R293 Abnormal posture: Secondary | ICD-10-CM

## 2017-07-20 NOTE — Patient Instructions (Signed)
Shoulder: Flexion (Supine)    With hands shoulder width apart, slowly lower dowel to floor behind head. Do not let elbows bend. Keep back flat. Hold __10-15__ seconds. Repeat _10___ times. Do __1__ sessions per day. CAUTION: Stretch slowly and gently.  Copyright  VHI. All rights reserved.  Shoulder: Abduction (Supine)    With right arm flat on floor, hold dowel in palm. Slowly move arm up to side of head by pushing with opposite arm. Do not let elbow bend. Hold __10-15__ seconds. Repeat _10___ times. Do __1__ sessions per day. CAUTION: Stretch slowly and gently.  Copyright  VHI. All rights reserved.   
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
## 2017-07-20 NOTE — Progress Notes (Addendum)
Location of Breast Cancer:Upper-outer quadrant of left breast in female    Histology per Pathology Report:  Diagnosis  07-07-17  1. Breast, excision, Right re-excision lateral margin - RESIDUAL DUCTAL CARCINOMA IN SITU, INTERMEDIATE GRADE - ATYPICAL LOBULAR HYPERPLASIA - COLUMNAR CELL HYPERPLASIA, MICROGLANDULAR ADENOSIS AND FIBROCYSTIC CHANGES - CALCIFICATIONS ASSOCIATED BENIGN BREAST TISSUE - MARGINS UNINVOLVED BY CARCINOMA (<0.1 CM, MEDIAL MARGIN) - SEE COMMENT 2. Breast, excision, Right re-excision anterior margin - ATYPICAL LOBULAR HYPERPLASIA - COLUMNAR CELL HYPERPLASIA, MICROGLANDULAR ADENOSIS AND FIBROCYSTIC CHANGES - FIBROADENOMATOID NODULE - NO RESIDUAL CARCINOMA IDENTIFIED - SEE COMMENT Microscopic Comment 1. and 2. There are multiple foci that were suspicious for residual carcinoma and some of these foci involved margins; therefore immunohistochemistry (S-100, SMM, p63, calponin, and E-cadherin) was performed on select blocks to accurately assess the presence of residual disease. There is no evidence of invasive carcinoma  Diagnosis  06-19-17 1. Breast, lumpectomy, Left - INVASIVE AND IN SITU DUCTAL CARCINOMA, 2.1 CM. - FIBROCYSTIC CHANGES WITH CALCIFICATIONS. - MARGINS NOT INVOLVED. - INVASIVE AND IN SITU CARCINOMA FOCALLY 0.1 CM FROM ANTERIOR MARGIN. - PREVIOUS BIOPSY SITE. 2. Breast, lumpectomy, Right - INVASIVE AND IN SITU DUCTAL CARCINOMA, 1.8 CM. - FIBROCYSTIC CHANGES. - INVASIVE AND IN SITU CARCINOMA FOCALLY INVOLVE THE LATERAL AND ANTERIOR MARGINS. - ATYPICAL LOBULAR HYPERPLASIA. - PREVIOUS BIOPSY SITE. 3. Lymph node, sentinel, biopsy, Left axillary - ONE BENIGN LYMPH NODE (0/1). 4. Lymph node, sentinel, biopsy, Left axillary - ONE BENIGN LYMPH NODE (0/1). 5. Lymph node, sentinel, biopsy, Left axillary - ONE BENIGN LYMPH NODE (0/1). 6. Lymph node, sentinel, biopsy, Left axillary - ONE BENIGN LYMPH NODE (0/1). 7. Lymph node, sentinel, biopsy, Left  axillary - ONE BENIGN LYMPH NODE (0/1). 8. Lymph node, sentinel, biopsy, Left axillary - ONE BENIGN LYMPH NODE (0/1). 9. Lymph node, sentinel, biopsy, Left axillary - ONE BENIGN LYMPH NODE (0/1). 10. Lymph node, sentinel, biopsy, Left axillary - ONE BENIGN LYMPH NODE (0/1). 11. Lymph node, sentinel, biopsy, Left axillary - ONE BENIGN LYMPH NODE (0/1). 1 of Diagnosis(continued) 12. Breast, excision, Right superior margin - ATYPICAL LOBULAR HYPERPLASIA (LOBULAR NEOPLASIA). - FIBROCYSTIC CHANGES. - NO INVASIVE CARCINOMA IDENTIFIED. - FINAL RIGHT SUPEROIR MARGIN CLEAR. 13. Lymph node, sentinel, biopsy, Right axillary - ONE BENIGN LYMPH NODE (0/1). 14. Lymph node, sentinel, biopsy, Right axillary - ONE BENIGN LYMPH NODE (0/1). 15. Lymph node, sentinel, biopsy, Right axillary - ONE BENIGN LYMPH NODE (0/1).  Receptor Status: ER(95 % +), PR (95 % +), Her2-neu (-), Ki-(20 %)   Left breast Receptor Status: ER(100 % +), PR (20% +), Her2-neu (-), Ki-(3%)     Right breast  Did patient present with symptoms (if so, please note symptoms) or was this found on screening mammography?: She noted a palpable lump in the left breast along a previous scar for a benign excision.  Past/Anticipated interventions by surgeon, if any:Dr. Rolm Bookbinder 07-07-17 Breast, excision, Right re-excision lateral margin and Breast, excision, Right re-excision anterior margin, 06-19-17   Breast, lumpectomy, Left and  Breast, lumpectomy, Right  Past/Anticipated interventions by medical oncology, if any: Chemotherapy No, Dr. Lindi Adie 07-13-17 Adjuvant radiation therapy,adjuvant antiestrogen therapy with tamoxifen, 06-24-17 Oncotype DX score 17: Risk of recurrence 11% with tamoxifen alone  Lymphedema issues, if any:  No taking PT biweekly started Friday 11- 2, 2048  Pain issues, if any: 5/10 chest  Taking Tylenol and Hydrocodone  SAFETY ISSUES:  Prior radiation? : No  Pacemaker/ICD? : No  Possible current  pregnancy? : No  Is the patient  on methotrexate? :No  Menarche 13   G2 P2    BC yes 16 years   Menopause N/A    HRT N/A  Current Complaints / other details: 41 y.o. married woman with a family history of cancer to include breast,colon,kidney, lung and melanoma. Wt Readings from Last 3 Encounters:  07/21/17 197 lb (89.4 kg)  07/21/17 197 lb (89.4 kg)  07/13/17 196 lb 6.4 oz (89.1 kg)  BP 113/61   Pulse 77   Temp 98.6 F (37 C) (Oral)   Resp 18   Ht 5' 8.5" (1.74 m)   Wt 197 lb (89.4 kg)   SpO2 99%   BMI 29.52 kg/m    Georgena Spurling, RN 07/20/2017,11:28 AM

## 2017-07-21 ENCOUNTER — Encounter: Payer: Self-pay | Admitting: Radiation Oncology

## 2017-07-21 ENCOUNTER — Ambulatory Visit
Admission: RE | Admit: 2017-07-21 | Discharge: 2017-07-21 | Disposition: A | Discharge status: Home / Self Care | Payer: 59 | Source: Ambulatory Visit | Attending: Radiation Oncology | Admitting: Radiation Oncology

## 2017-07-21 ENCOUNTER — Telehealth: Payer: Self-pay | Admitting: Hematology and Oncology

## 2017-07-21 ENCOUNTER — Ambulatory Visit (INDEPENDENT_AMBULATORY_CARE_PROVIDER_SITE_OTHER): Payer: 59 | Admitting: Family Medicine

## 2017-07-21 ENCOUNTER — Encounter: Payer: Self-pay | Admitting: Family Medicine

## 2017-07-21 VITALS — BP 113/61 | HR 77 | Temp 98.6°F | Resp 18 | Ht 68.5 in | Wt 197.0 lb

## 2017-07-21 VITALS — BP 116/84 | HR 44 | Temp 98.6°F | Ht 68.5 in | Wt 197.0 lb

## 2017-07-21 DIAGNOSIS — F329 Major depressive disorder, single episode, unspecified: Secondary | ICD-10-CM | POA: Diagnosis not present

## 2017-07-21 DIAGNOSIS — C50412 Malignant neoplasm of upper-outer quadrant of left female breast: Secondary | ICD-10-CM

## 2017-07-21 DIAGNOSIS — Z510 Encounter for antineoplastic radiation therapy: Secondary | ICD-10-CM | POA: Diagnosis not present

## 2017-07-21 DIAGNOSIS — Z170 Estrogen receptor positive status [ER+]: Secondary | ICD-10-CM

## 2017-07-21 DIAGNOSIS — F4323 Adjustment disorder with mixed anxiety and depressed mood: Secondary | ICD-10-CM | POA: Diagnosis not present

## 2017-07-21 DIAGNOSIS — I493 Ventricular premature depolarization: Secondary | ICD-10-CM | POA: Diagnosis not present

## 2017-07-21 DIAGNOSIS — Z8249 Family history of ischemic heart disease and other diseases of the circulatory system: Secondary | ICD-10-CM | POA: Diagnosis not present

## 2017-07-21 DIAGNOSIS — Z801 Family history of malignant neoplasm of trachea, bronchus and lung: Secondary | ICD-10-CM | POA: Diagnosis not present

## 2017-07-21 DIAGNOSIS — D0512 Intraductal carcinoma in situ of left breast: Secondary | ICD-10-CM | POA: Diagnosis not present

## 2017-07-21 DIAGNOSIS — Z87440 Personal history of urinary (tract) infections: Secondary | ICD-10-CM | POA: Diagnosis not present

## 2017-07-21 DIAGNOSIS — C50512 Malignant neoplasm of lower-outer quadrant of left female breast: Secondary | ICD-10-CM

## 2017-07-21 DIAGNOSIS — Z800 Family history of malignant neoplasm of digestive organs: Secondary | ICD-10-CM | POA: Diagnosis not present

## 2017-07-21 DIAGNOSIS — Z8051 Family history of malignant neoplasm of kidney: Secondary | ICD-10-CM | POA: Diagnosis not present

## 2017-07-21 DIAGNOSIS — K219 Gastro-esophageal reflux disease without esophagitis: Secondary | ICD-10-CM | POA: Diagnosis not present

## 2017-07-21 DIAGNOSIS — Z882 Allergy status to sulfonamides status: Secondary | ICD-10-CM | POA: Diagnosis not present

## 2017-07-21 DIAGNOSIS — F419 Anxiety disorder, unspecified: Secondary | ICD-10-CM | POA: Diagnosis not present

## 2017-07-21 DIAGNOSIS — C50511 Malignant neoplasm of lower-outer quadrant of right female breast: Secondary | ICD-10-CM

## 2017-07-21 HISTORY — DX: Encounter for other screening for genetic and chromosomal anomalies: Z13.79

## 2017-07-21 MED ORDER — VENLAFAXINE HCL 37.5 MG PO TABS: 75.0000 mg | ORAL_TABLET | Freq: Two times a day (BID) | ORAL | 3 refills | Status: DC

## 2017-07-21 NOTE — Assessment & Plan Note (Signed)
Pt was told she had ? freq pvc during surgery  ? If anx related symptoms - but no frank palpitations Nl EKG today NSR rate of 61 and no PVC or PAC   Disc what to watch for  Avoid caffeine  Continue to follow
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
## 2017-07-21 NOTE — Telephone Encounter (Signed)
Faxed records to [facility] [phone]

## 2017-07-21 NOTE — Patient Instructions (Addendum)
Tomorrow start effexor (instead of zoloft)  Take the 37.5 mg pill daily for 4-7 days - if no intolerable side effects then go up to 75 mg daily (2 of the 37.5 pills)  Update [place] if problems or if you feel worse   Follow up in about a month   EKG looks ok today  If you develop more palpitations or chest symptoms please let us know   Try to take care of yourself  Get with counseling as planned

## 2017-07-21 NOTE — Assessment & Plan Note (Signed)
More anxiety with recent health problems and surgeries (incl breast cancer and lumbar fusion) Also has to change zoloft due to pot interaction with tamoxifen  Will change to effexor xr 37.5 daily with quick taper to 75 mg as tolerated Discussed expectations of SNRI medication including time to effectiveness and mechanism of action, also poss of side effects (early and late)- including mental fuzziness, weight or appetite change, nausea and poss of worse dep or anxiety (even suicidal thoughts)  Pt voiced understanding and will stop med and update if this occurs   Reviewed stressors/ coping techniques/symptoms/ support sources/ tx options and side effects in detail today  >25 minutes spent in face to face time with patient, >50% spent in counselling or coordination of care (including pros/cons of this medication/ that it can be diff to get off of and imp of counseling)  She has a plan for counseling  Self care and journal encouraged

## 2017-07-22 ENCOUNTER — Ambulatory Visit: Payer: 59 | Admitting: Physical Therapy

## 2017-07-22 ENCOUNTER — Encounter: Payer: Self-pay | Admitting: Radiation Oncology

## 2017-07-22 ENCOUNTER — Encounter: Payer: Self-pay | Admitting: Physical Therapy

## 2017-07-22 DIAGNOSIS — M25612 Stiffness of left shoulder, not elsewhere classified: Secondary | ICD-10-CM

## 2017-07-22 DIAGNOSIS — M79602 Pain in left arm: Secondary | ICD-10-CM

## 2017-07-22 DIAGNOSIS — M25611 Stiffness of right shoulder, not elsewhere classified: Secondary | ICD-10-CM

## 2017-07-22 DIAGNOSIS — R293 Abnormal posture: Secondary | ICD-10-CM

## 2017-07-22 NOTE — Patient Instructions (Signed)
Over Head Pull: Narrow and Wide Grip   Cancer Rehab [phone]   On back, knees bent, feet flat, band across thighs, elbows straight but relaxed. Pull hands apart (start). Keeping elbows straight, bring arms up and over head, hands toward floor. Keep pull steady on band. Hold momentarily. Return slowly, keeping pull steady, back to start. Then do same with a wider grip on the band (past shoulder width) Repeat _10__ times. Band color __yellow____   Side Pull: Double Arm   On back, knees bent, feet flat. Arms perpendicular to body, shoulder level, elbows straight but relaxed. Pull arms out to sides, elbows straight. Resistance band comes across collarbones, hands toward floor. Hold momentarily. Slowly return to starting position. Repeat _10__ times. Band color _yellow____   Sword   On back, knees bent, feet flat, left hand on left hip, right hand above left. Pull right arm DIAGONALLY (hip to shoulder) across chest. Bring right arm along head toward floor. Hold momentarily. Slowly return to starting position. Repeat on opposite side.  Repeat _10__ times. Do with left arm. Band color _yellow_____   Shoulder Rotation: Double Arm   On back, knees bent, feet flat, elbows tucked at sides, bent 90, hands palms up. Pull hands apart and down toward floor, keeping elbows near sides. Hold momentarily. Slowly return to starting position. Repeat _10__ times. Band color __yellow____    
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
## 2017-07-24 DIAGNOSIS — Z170 Estrogen receptor positive status [ER+]: Secondary | ICD-10-CM

## 2017-07-24 DIAGNOSIS — C50511 Malignant neoplasm of lower-outer quadrant of right female breast: Secondary | ICD-10-CM | POA: Insufficient documentation

## 2017-07-24 NOTE — Addendum Note (Signed)
Encounter addended by: Kyung Rudd, MD on: 07/24/2017 9:45 AM  Actions taken: Problem List modified, Visit diagnoses modified

## 2017-07-27 ENCOUNTER — Ambulatory Visit: Payer: 59 | Admitting: Physical Therapy

## 2017-07-27 ENCOUNTER — Encounter: Payer: Self-pay | Admitting: Physical Therapy

## 2017-07-27 DIAGNOSIS — M25612 Stiffness of left shoulder, not elsewhere classified: Secondary | ICD-10-CM | POA: Diagnosis not present

## 2017-07-27 DIAGNOSIS — R293 Abnormal posture: Secondary | ICD-10-CM

## 2017-07-27 DIAGNOSIS — M25611 Stiffness of right shoulder, not elsewhere classified: Secondary | ICD-10-CM

## 2017-07-27 DIAGNOSIS — M79602 Pain in left arm: Secondary | ICD-10-CM
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
## 2017-07-27 NOTE — Patient Instructions (Signed)
Strengthening: Resisted Internal Rotation   Hold tubing in left hand, elbow at side and forearm out. Rotate forearm in across body. Repeat __10__ times per set. Do __1__ sets per session. Do __1__ sessions per day.  http://orth.exer.us/830   Copyright  VHI. All rights reserved.  Strengthening: Resisted External Rotation   Hold tubing in right hand, elbow at side and forearm across body. Rotate forearm out. Repeat _10___ times per set. Do __1__ sets per session. Do __1__ sessions per day.  http://orth.exer.us/828   Copyright  VHI. All rights reserved.  Strengthening: Resisted Flexion   Hold tubing with left arm at side. Pull forward and up. Move shoulder through pain-free range of motion. Repeat __10__ times per set. Do __1__ sets per session. Do __1__ sessions per day.  http://orth.exer.us/824   Copyright  VHI. All rights reserved.  Strengthening: Resisted Extension   Hold tubing in right hand, arm forward. Pull arm back, elbow straight. Repeat _10___ times per set. Do _1___ sets per session. Do _1___ sessions per day.  http://orth.exer.us/832   Copyright  VHI. All rights reserved.   Repeat on opposite side.

## 2017-07-29 ENCOUNTER — Encounter: Payer: Self-pay | Admitting: Physical Therapy

## 2017-07-29 ENCOUNTER — Ambulatory Visit: Payer: 59 | Admitting: Physical Therapy

## 2017-07-29 DIAGNOSIS — R293 Abnormal posture: Secondary | ICD-10-CM

## 2017-07-29 DIAGNOSIS — M79602 Pain in left arm: Secondary | ICD-10-CM

## 2017-07-29 DIAGNOSIS — M25611 Stiffness of right shoulder, not elsewhere classified: Secondary | ICD-10-CM

## 2017-07-29 DIAGNOSIS — M25612 Stiffness of left shoulder, not elsewhere classified: Secondary | ICD-10-CM | POA: Diagnosis not present
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
## 2017-08-04 ENCOUNTER — Ambulatory Visit: Payer: 59 | Admitting: Physical Therapy

## 2017-08-04 ENCOUNTER — Ambulatory Visit
Admission: RE | Admit: 2017-08-04 | Discharge: 2017-08-04 | Disposition: A | Discharge status: Home / Self Care | Payer: 59 | Source: Ambulatory Visit | Attending: Radiation Oncology | Admitting: Radiation Oncology

## 2017-08-04 ENCOUNTER — Encounter: Payer: 59 | Admitting: Physical Therapy

## 2017-08-04 DIAGNOSIS — C50412 Malignant neoplasm of upper-outer quadrant of left female breast: Secondary | ICD-10-CM

## 2017-08-04 DIAGNOSIS — Z170 Estrogen receptor positive status [ER+]: Principal | ICD-10-CM

## 2017-08-04 DIAGNOSIS — M25611 Stiffness of right shoulder, not elsewhere classified: Secondary | ICD-10-CM

## 2017-08-04 DIAGNOSIS — M25612 Stiffness of left shoulder, not elsewhere classified: Secondary | ICD-10-CM | POA: Diagnosis not present

## 2017-08-04 DIAGNOSIS — C50511 Malignant neoplasm of lower-outer quadrant of right female breast: Secondary | ICD-10-CM

## 2017-08-04 DIAGNOSIS — Z510 Encounter for antineoplastic radiation therapy: Secondary | ICD-10-CM | POA: Diagnosis not present
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
## 2017-08-10 ENCOUNTER — Telehealth: Payer: Self-pay | Admitting: Hematology and Oncology

## 2017-08-10 NOTE — Telephone Encounter (Signed)
Spoke with patient regarding appt added per 11/19 sch msg

## 2017-08-11 DIAGNOSIS — Z510 Encounter for antineoplastic radiation therapy: Secondary | ICD-10-CM | POA: Diagnosis not present

## 2017-08-12 ENCOUNTER — Ambulatory Visit: Payer: 59 | Admitting: Radiation Oncology

## 2017-08-12 ENCOUNTER — Encounter: Payer: Self-pay | Admitting: Physical Therapy

## 2017-08-12 ENCOUNTER — Ambulatory Visit
Admission: RE | Admit: 2017-08-12 | Discharge: 2017-08-12 | Disposition: A | Discharge status: Home / Self Care | Payer: 59 | Source: Ambulatory Visit | Attending: Radiation Oncology | Admitting: Radiation Oncology

## 2017-08-12 ENCOUNTER — Ambulatory Visit: Payer: 59 | Admitting: Physical Therapy

## 2017-08-12 ENCOUNTER — Other Ambulatory Visit: Payer: Self-pay

## 2017-08-12 DIAGNOSIS — M25612 Stiffness of left shoulder, not elsewhere classified: Secondary | ICD-10-CM

## 2017-08-12 DIAGNOSIS — R293 Abnormal posture: Secondary | ICD-10-CM

## 2017-08-12 DIAGNOSIS — M79602 Pain in left arm: Secondary | ICD-10-CM

## 2017-08-12 DIAGNOSIS — Z510 Encounter for antineoplastic radiation therapy: Secondary | ICD-10-CM | POA: Diagnosis not present

## 2017-08-12 DIAGNOSIS — Z483 Aftercare following surgery for neoplasm: Secondary | ICD-10-CM

## 2017-08-12 DIAGNOSIS — M25611 Stiffness of right shoulder, not elsewhere classified: Secondary | ICD-10-CM
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
## 2017-08-13 ENCOUNTER — Ambulatory Visit
Admission: RE | Admit: 2017-08-13 | Discharge: 2017-08-13 | Disposition: A | Discharge status: Home / Self Care | Payer: 59 | Source: Ambulatory Visit | Attending: Radiation Oncology | Admitting: Radiation Oncology

## 2017-08-13 ENCOUNTER — Encounter: Payer: Self-pay | Admitting: General Practice

## 2017-08-13 DIAGNOSIS — Z510 Encounter for antineoplastic radiation therapy: Secondary | ICD-10-CM | POA: Diagnosis not present

## 2017-08-13 NOTE — Progress Notes (Addendum)
[facility] Psychosocial Distress Screening Clinical Social Work  Clinical Social Work was referred by distress screening protocol.  The patient scored a 8 on the initial Psychosocial Distress Thermometer, subsequent score of 5 which indicates moderate distress. Clinical Social Worker Edwyna Shell to assess for distress and other psychosocial needs. CSW spoke w patient who requested call back at more convenient time.   ON[facility] DISTRESS SCREENING 07/21/2017  Screening Type Initial Screening  Distress experienced in past week (1-10) 5  Family Problem type   Emotional problem type Nervousness/Anxiety;Adjusting to appearance changes  Spiritual/Religous concerns type   Information Concerns Type   Physical Problem type Pain;Sleep/insomnia  Physician notified of physical symptoms Yes  Referral to support programs    ONCBCN DISTRESS SCREENING 05/20/2017  Screening Type Initial Screening  Distress experienced in past week (1-10) 8  Family Problem type Children  Emotional problem type Nervousness/Anxiety;Adjusting to illness;Isolation/feeling alone;Boredom  Spiritual/Religous concerns type Loss of Faith;Facing my mortality  Information Concerns Type Lack of info about diagnosis;Lack of info about treatment  Physical Problem type Pain;Nausea/vomiting;Sleep/insomnia;Loss of appetitie  Physician notified of physical symptoms   Referral to support programs Yes  Other referral for [facility] (mentor)     Clinical Social Worker follow up needed: No.  If yes, follow up plan:  08/14/17:  CSW spoke w patient who requested mailed packet of pertinent information, will review and contact CSW if interested in accessing services.   Edwyna Shell, LCSW Clinical Social Worker Phone:  [phone]

## 2017-08-14 ENCOUNTER — Encounter: Payer: Self-pay | Admitting: Physical Therapy

## 2017-08-14 ENCOUNTER — Ambulatory Visit: Payer: 59 | Admitting: Physical Therapy

## 2017-08-14 ENCOUNTER — Ambulatory Visit
Admission: RE | Admit: 2017-08-14 | Discharge: 2017-08-14 | Disposition: A | Discharge status: Home / Self Care | Payer: 59 | Source: Ambulatory Visit | Attending: Radiation Oncology | Admitting: Radiation Oncology

## 2017-08-14 DIAGNOSIS — M25612 Stiffness of left shoulder, not elsewhere classified: Secondary | ICD-10-CM | POA: Diagnosis not present

## 2017-08-14 DIAGNOSIS — M25611 Stiffness of right shoulder, not elsewhere classified: Secondary | ICD-10-CM

## 2017-08-14 DIAGNOSIS — M79602 Pain in left arm: Secondary | ICD-10-CM

## 2017-08-14 DIAGNOSIS — Z510 Encounter for antineoplastic radiation therapy: Secondary | ICD-10-CM | POA: Diagnosis not present
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
## 2017-08-17 ENCOUNTER — Ambulatory Visit
Admission: RE | Admit: 2017-08-17 | Discharge: 2017-08-17 | Disposition: A | Discharge status: Home / Self Care | Payer: 59 | Source: Ambulatory Visit | Attending: Radiation Oncology | Admitting: Radiation Oncology

## 2017-08-17 ENCOUNTER — Ambulatory Visit: Payer: 59 | Attending: General Surgery | Admitting: Physical Therapy

## 2017-08-17 DIAGNOSIS — C50512 Malignant neoplasm of lower-outer quadrant of left female breast: Secondary | ICD-10-CM | POA: Diagnosis not present

## 2017-08-17 DIAGNOSIS — C50511 Malignant neoplasm of lower-outer quadrant of right female breast: Secondary | ICD-10-CM | POA: Diagnosis not present

## 2017-08-17 DIAGNOSIS — R293 Abnormal posture: Secondary | ICD-10-CM | POA: Diagnosis present

## 2017-08-17 DIAGNOSIS — Z510 Encounter for antineoplastic radiation therapy: Secondary | ICD-10-CM | POA: Diagnosis not present

## 2017-08-17 DIAGNOSIS — M79602 Pain in left arm: Secondary | ICD-10-CM | POA: Insufficient documentation

## 2017-08-17 DIAGNOSIS — M25612 Stiffness of left shoulder, not elsewhere classified: Secondary | ICD-10-CM | POA: Diagnosis not present

## 2017-08-17 DIAGNOSIS — Z483 Aftercare following surgery for neoplasm: Secondary | ICD-10-CM | POA: Diagnosis present

## 2017-08-17 DIAGNOSIS — M25611 Stiffness of right shoulder, not elsewhere classified: Secondary | ICD-10-CM

## 2017-08-17 DIAGNOSIS — M79601 Pain in right arm: Secondary | ICD-10-CM | POA: Insufficient documentation
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
## 2017-08-18 ENCOUNTER — Ambulatory Visit
Admission: RE | Admit: 2017-08-18 | Discharge: 2017-08-18 | Disposition: A | Discharge status: Home / Self Care | Payer: 59 | Source: Ambulatory Visit | Attending: Radiation Oncology | Admitting: Radiation Oncology

## 2017-08-18 DIAGNOSIS — M25612 Stiffness of left shoulder, not elsewhere classified: Secondary | ICD-10-CM | POA: Diagnosis not present

## 2017-08-18 DIAGNOSIS — Z510 Encounter for antineoplastic radiation therapy: Secondary | ICD-10-CM | POA: Insufficient documentation

## 2017-08-18 DIAGNOSIS — R05 Cough: Secondary | ICD-10-CM | POA: Insufficient documentation

## 2017-08-18 DIAGNOSIS — C50511 Malignant neoplasm of lower-outer quadrant of right female breast: Secondary | ICD-10-CM | POA: Insufficient documentation

## 2017-08-18 DIAGNOSIS — C50412 Malignant neoplasm of upper-outer quadrant of left female breast: Secondary | ICD-10-CM | POA: Insufficient documentation

## 2017-08-18 DIAGNOSIS — Z79899 Other long term (current) drug therapy: Secondary | ICD-10-CM | POA: Insufficient documentation

## 2017-08-18 DIAGNOSIS — Z170 Estrogen receptor positive status [ER+]: Secondary | ICD-10-CM | POA: Insufficient documentation

## 2017-08-19 ENCOUNTER — Ambulatory Visit: Payer: 59 | Admitting: Physical Therapy

## 2017-08-19 ENCOUNTER — Ambulatory Visit
Admission: RE | Admit: 2017-08-19 | Discharge: 2017-08-19 | Disposition: A | Discharge status: Home / Self Care | Payer: 59 | Source: Ambulatory Visit | Attending: Radiation Oncology | Admitting: Radiation Oncology

## 2017-08-19 ENCOUNTER — Telehealth: Payer: Self-pay | Admitting: *Deleted

## 2017-08-19 DIAGNOSIS — M25611 Stiffness of right shoulder, not elsewhere classified: Secondary | ICD-10-CM

## 2017-08-19 DIAGNOSIS — Z483 Aftercare following surgery for neoplasm: Secondary | ICD-10-CM

## 2017-08-19 DIAGNOSIS — R293 Abnormal posture: Secondary | ICD-10-CM

## 2017-08-19 DIAGNOSIS — M25612 Stiffness of left shoulder, not elsewhere classified: Secondary | ICD-10-CM
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
## 2017-08-19 NOTE — Telephone Encounter (Signed)
Called and left voice message, patient rad tx today is at 430pm,  ,she has a lymphedema treatment at 1015-1100, asked if she could come in today after that as Dr. Lisbeth Renshaw has an appt during her treatment time tomorrow ,asked that she call back to see if that is feasible for her 9:50 AM

## 2017-08-20 ENCOUNTER — Ambulatory Visit
Admission: RE | Admit: 2017-08-20 | Discharge: 2017-08-20 | Disposition: A | Discharge status: Home / Self Care | Payer: 59 | Source: Ambulatory Visit | Attending: Radiation Oncology | Admitting: Radiation Oncology

## 2017-08-20 DIAGNOSIS — M25612 Stiffness of left shoulder, not elsewhere classified: Secondary | ICD-10-CM | POA: Diagnosis not present

## 2017-08-21 ENCOUNTER — Ambulatory Visit
Admission: RE | Admit: 2017-08-21 | Discharge: 2017-08-21 | Disposition: A | Discharge status: Home / Self Care | Payer: 59 | Source: Ambulatory Visit | Attending: Radiation Oncology | Admitting: Radiation Oncology

## 2017-08-21 DIAGNOSIS — C50511 Malignant neoplasm of lower-outer quadrant of right female breast: Secondary | ICD-10-CM | POA: Diagnosis not present

## 2017-08-21 DIAGNOSIS — R05 Cough: Secondary | ICD-10-CM | POA: Diagnosis not present

## 2017-08-21 DIAGNOSIS — C50412 Malignant neoplasm of upper-outer quadrant of left female breast: Secondary | ICD-10-CM | POA: Diagnosis not present

## 2017-08-21 DIAGNOSIS — Z510 Encounter for antineoplastic radiation therapy: Secondary | ICD-10-CM | POA: Diagnosis present

## 2017-08-21 DIAGNOSIS — Z79899 Other long term (current) drug therapy: Secondary | ICD-10-CM | POA: Diagnosis not present

## 2017-08-21 DIAGNOSIS — Z170 Estrogen receptor positive status [ER+]: Secondary | ICD-10-CM | POA: Diagnosis not present

## 2017-08-24 ENCOUNTER — Ambulatory Visit: Payer: 59

## 2017-08-25 ENCOUNTER — Ambulatory Visit
Admission: RE | Admit: 2017-08-25 | Discharge: 2017-08-25 | Disposition: A | Discharge status: Home / Self Care | Payer: 59 | Source: Ambulatory Visit | Attending: Radiation Oncology | Admitting: Radiation Oncology

## 2017-08-25 DIAGNOSIS — Z510 Encounter for antineoplastic radiation therapy: Secondary | ICD-10-CM | POA: Diagnosis not present

## 2017-08-26 ENCOUNTER — Ambulatory Visit
Admission: RE | Admit: 2017-08-26 | Discharge: 2017-08-26 | Disposition: A | Discharge status: Home / Self Care | Payer: 59 | Source: Ambulatory Visit | Attending: Radiation Oncology | Admitting: Radiation Oncology

## 2017-08-26 ENCOUNTER — Encounter: Payer: Self-pay | Admitting: Radiation Oncology

## 2017-08-26 DIAGNOSIS — Z510 Encounter for antineoplastic radiation therapy: Secondary | ICD-10-CM | POA: Diagnosis not present

## 2017-08-26 NOTE — Progress Notes (Signed)
I received, today, FMLA paperwork on Margaret Rojas. It is on the cart to go to [employer].

## 2017-08-27 ENCOUNTER — Ambulatory Visit
Admission: RE | Admit: 2017-08-27 | Discharge: 2017-08-27 | Disposition: A | Discharge status: Home / Self Care | Payer: 59 | Source: Ambulatory Visit | Attending: Radiation Oncology | Admitting: Radiation Oncology

## 2017-08-27 ENCOUNTER — Ambulatory Visit: Payer: 59 | Admitting: Physical Therapy

## 2017-08-27 ENCOUNTER — Encounter: Payer: Self-pay | Admitting: Physical Therapy

## 2017-08-27 ENCOUNTER — Other Ambulatory Visit: Payer: Self-pay

## 2017-08-27 DIAGNOSIS — M25612 Stiffness of left shoulder, not elsewhere classified: Secondary | ICD-10-CM

## 2017-08-27 DIAGNOSIS — M79601 Pain in right arm: Secondary | ICD-10-CM

## 2017-08-27 DIAGNOSIS — M79602 Pain in left arm: Secondary | ICD-10-CM

## 2017-08-27 DIAGNOSIS — Z510 Encounter for antineoplastic radiation therapy: Secondary | ICD-10-CM | POA: Diagnosis not present

## 2017-08-27 DIAGNOSIS — R293 Abnormal posture: Secondary | ICD-10-CM

## 2017-08-27 DIAGNOSIS — Z483 Aftercare following surgery for neoplasm: Secondary | ICD-10-CM

## 2017-08-27 DIAGNOSIS — M25611 Stiffness of right shoulder, not elsewhere classified: Secondary | ICD-10-CM
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
## 2017-08-28 ENCOUNTER — Ambulatory Visit
Admission: RE | Admit: 2017-08-28 | Discharge: 2017-08-28 | Disposition: A | Discharge status: Home / Self Care | Payer: 59 | Source: Ambulatory Visit | Attending: Radiation Oncology | Admitting: Radiation Oncology

## 2017-08-28 ENCOUNTER — Encounter: Payer: 59 | Admitting: Physical Therapy

## 2017-08-28 DIAGNOSIS — Z510 Encounter for antineoplastic radiation therapy: Secondary | ICD-10-CM | POA: Diagnosis not present

## 2017-08-29 ENCOUNTER — Ambulatory Visit
Admission: RE | Admit: 2017-08-29 | Discharge: 2017-08-29 | Disposition: A | Discharge status: Home / Self Care | Payer: 59 | Source: Ambulatory Visit | Attending: Radiation Oncology | Admitting: Radiation Oncology

## 2017-08-29 DIAGNOSIS — Z510 Encounter for antineoplastic radiation therapy: Secondary | ICD-10-CM | POA: Diagnosis not present

## 2017-08-31 ENCOUNTER — Ambulatory Visit
Admission: RE | Admit: 2017-08-31 | Discharge: 2017-08-31 | Disposition: A | Discharge status: Home / Self Care | Payer: 59 | Source: Ambulatory Visit | Attending: Radiation Oncology | Admitting: Radiation Oncology

## 2017-08-31 DIAGNOSIS — Z510 Encounter for antineoplastic radiation therapy: Secondary | ICD-10-CM | POA: Diagnosis not present

## 2017-09-01 ENCOUNTER — Ambulatory Visit
Admission: RE | Admit: 2017-09-01 | Discharge: 2017-09-01 | Disposition: A | Discharge status: Home / Self Care | Payer: 59 | Source: Ambulatory Visit | Attending: Radiation Oncology | Admitting: Radiation Oncology

## 2017-09-01 ENCOUNTER — Ambulatory Visit: Payer: 59

## 2017-09-01 DIAGNOSIS — M25612 Stiffness of left shoulder, not elsewhere classified: Secondary | ICD-10-CM | POA: Diagnosis not present

## 2017-09-01 DIAGNOSIS — Z483 Aftercare following surgery for neoplasm: Secondary | ICD-10-CM

## 2017-09-01 DIAGNOSIS — M79602 Pain in left arm: Secondary | ICD-10-CM

## 2017-09-01 DIAGNOSIS — M25611 Stiffness of right shoulder, not elsewhere classified: Secondary | ICD-10-CM

## 2017-09-01 DIAGNOSIS — Z510 Encounter for antineoplastic radiation therapy: Secondary | ICD-10-CM | POA: Diagnosis not present

## 2017-09-01 DIAGNOSIS — M79601 Pain in right arm: Secondary | ICD-10-CM

## 2017-09-01 DIAGNOSIS — R293 Abnormal posture: Secondary | ICD-10-CM
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
## 2017-09-02 ENCOUNTER — Ambulatory Visit
Admission: RE | Admit: 2017-09-02 | Discharge: 2017-09-02 | Disposition: A | Discharge status: Home / Self Care | Payer: 59 | Source: Ambulatory Visit | Attending: Radiation Oncology | Admitting: Radiation Oncology

## 2017-09-02 DIAGNOSIS — Z510 Encounter for antineoplastic radiation therapy: Secondary | ICD-10-CM | POA: Diagnosis not present

## 2017-09-03 ENCOUNTER — Ambulatory Visit
Admission: RE | Admit: 2017-09-03 | Discharge: 2017-09-03 | Disposition: A | Discharge status: Home / Self Care | Payer: 59 | Source: Ambulatory Visit | Attending: Radiation Oncology | Admitting: Radiation Oncology

## 2017-09-03 ENCOUNTER — Encounter: Payer: 59 | Admitting: Physical Therapy

## 2017-09-03 DIAGNOSIS — Z510 Encounter for antineoplastic radiation therapy: Secondary | ICD-10-CM | POA: Diagnosis not present

## 2017-09-04 ENCOUNTER — Ambulatory Visit
Admission: RE | Admit: 2017-09-04 | Discharge: 2017-09-04 | Disposition: A | Discharge status: Home / Self Care | Payer: 59 | Source: Ambulatory Visit | Attending: Radiation Oncology | Admitting: Radiation Oncology

## 2017-09-04 ENCOUNTER — Telehealth: Payer: Self-pay | Admitting: *Deleted

## 2017-09-04 DIAGNOSIS — Z170 Estrogen receptor positive status [ER+]: Principal | ICD-10-CM

## 2017-09-04 DIAGNOSIS — C50511 Malignant neoplasm of lower-outer quadrant of right female breast: Secondary | ICD-10-CM

## 2017-09-04 DIAGNOSIS — Z510 Encounter for antineoplastic radiation therapy: Secondary | ICD-10-CM | POA: Diagnosis not present

## 2017-09-04 MED ORDER — SONAFINE EX EMUL
1.0000 "application " | Freq: Two times a day (BID) | CUTANEOUS | Status: DC
  Administered 2017-09-04: 1 via TOPICAL

## 2017-09-04 MED ORDER — GUAIFENESIN-CODEINE 100-10 MG/5ML PO SOLN: 10.0000 mL | Freq: Four times a day (QID) | ORAL | 0 refills | Status: DC | PRN

## 2017-09-07 ENCOUNTER — Other Ambulatory Visit: Payer: Self-pay | Admitting: Radiation Oncology

## 2017-09-07 ENCOUNTER — Ambulatory Visit
Admission: RE | Admit: 2017-09-07 | Discharge: 2017-09-07 | Disposition: A | Discharge status: Home / Self Care | Payer: 59 | Source: Ambulatory Visit | Attending: Radiation Oncology | Admitting: Radiation Oncology

## 2017-09-07 DIAGNOSIS — Z510 Encounter for antineoplastic radiation therapy: Secondary | ICD-10-CM | POA: Diagnosis not present

## 2017-09-07 MED ORDER — LEVOFLOXACIN 750 MG PO TABS: 750.0000 mg | ORAL_TABLET | Freq: Every day | ORAL | 0 refills | Status: DC

## 2017-09-07 MED ORDER — BENZONATATE 100 MG PO CAPS: 100.0000 mg | ORAL_CAPSULE | Freq: Three times a day (TID) | ORAL | 0 refills | Status: DC | PRN

## 2017-09-07 NOTE — Progress Notes (Signed)
The pharmacy called the patient and apparently there is a concern for prolongation of QT intervals with her Effexor and with Levaquin. We will switch abx to Augmentin, and I also encouraged her to call me if she has any progressive symptoms.

## 2017-09-07 NOTE — Progress Notes (Signed)
Patient today in the treatment area, she has noticed increased erythema and itchiness in the breast area, she has been using sonafine cream, and as needed hydrocortisone.  She states that her nipples are quite sensitive, and reports that she is also continuing to have a cough more so at night. She is not having any fevers or chills, and denies any productive mucous.  After discussing this we also did an examination I listen to her heart and lungs, her heart has a regular rate and rhythm no clicks rubs or murmurs are auscultated.  Chest is clear to auscultation bilaterally, though there are crackles in the right lung base.  Her breasts are erythematous consistent with radiotherapy exposure, I encouraged her to continue her sonafine but also aloe vera and hydrocortisone as needed, but with holding these 4 hours prior to treatment.  She is in agreement, we also discussed hydrogel pads which can be purchased in a nursing area and several big box stores, and I also discussed with her the risks and benefits of treating her empirically for a pneumonia, she is interested with this approach and being aggressive in trying to help her feel better, I sent in a prescription for Levaquin for a 10-day supply as well as Tessalon Perles for cough.  She will keep me informed of her progress, and we will follow this expectantly along with her  Treatment.    Carola Rhine, PAC

## 2017-09-09 ENCOUNTER — Ambulatory Visit
Admission: RE | Admit: 2017-09-09 | Discharge: 2017-09-09 | Disposition: A | Discharge status: Home / Self Care | Payer: 59 | Source: Ambulatory Visit | Attending: Radiation Oncology | Admitting: Radiation Oncology

## 2017-09-09 DIAGNOSIS — Z510 Encounter for antineoplastic radiation therapy: Secondary | ICD-10-CM | POA: Diagnosis not present

## 2017-09-09 NOTE — Telephone Encounter (Signed)
error 

## 2017-09-10 ENCOUNTER — Ambulatory Visit
Admission: RE | Admit: 2017-09-10 | Discharge: 2017-09-10 | Disposition: A | Discharge status: Home / Self Care | Payer: 59 | Source: Ambulatory Visit | Attending: Radiation Oncology | Admitting: Radiation Oncology

## 2017-09-10 DIAGNOSIS — Z510 Encounter for antineoplastic radiation therapy: Secondary | ICD-10-CM | POA: Diagnosis not present

## 2017-09-11 ENCOUNTER — Ambulatory Visit: Payer: 59 | Admitting: Physical Therapy

## 2017-09-11 ENCOUNTER — Ambulatory Visit
Admission: RE | Admit: 2017-09-11 | Discharge: 2017-09-11 | Disposition: A | Discharge status: Home / Self Care | Payer: 59 | Source: Ambulatory Visit | Attending: Radiation Oncology | Admitting: Radiation Oncology

## 2017-09-11 ENCOUNTER — Encounter: Payer: Self-pay | Admitting: Physical Therapy

## 2017-09-11 DIAGNOSIS — Z483 Aftercare following surgery for neoplasm: Secondary | ICD-10-CM

## 2017-09-11 DIAGNOSIS — M25612 Stiffness of left shoulder, not elsewhere classified: Secondary | ICD-10-CM

## 2017-09-11 DIAGNOSIS — M79602 Pain in left arm: Secondary | ICD-10-CM

## 2017-09-11 DIAGNOSIS — M79601 Pain in right arm: Secondary | ICD-10-CM

## 2017-09-11 DIAGNOSIS — R293 Abnormal posture: Secondary | ICD-10-CM

## 2017-09-11 DIAGNOSIS — Z510 Encounter for antineoplastic radiation therapy: Secondary | ICD-10-CM | POA: Diagnosis not present

## 2017-09-11 DIAGNOSIS — M25611 Stiffness of right shoulder, not elsewhere classified: Secondary | ICD-10-CM
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
## 2017-09-14 ENCOUNTER — Ambulatory Visit (HOSPITAL_COMMUNITY)
Admission: RE | Admit: 2017-09-14 | Discharge: 2017-09-14 | Disposition: A | Discharge status: Home / Self Care | Payer: 59 | Source: Ambulatory Visit | Attending: Radiation Oncology | Admitting: Radiation Oncology

## 2017-09-14 ENCOUNTER — Ambulatory Visit
Admission: RE | Admit: 2017-09-14 | Discharge: 2017-09-14 | Disposition: A | Discharge status: Home / Self Care | Payer: 59 | Source: Ambulatory Visit | Attending: Radiation Oncology | Admitting: Radiation Oncology

## 2017-09-14 ENCOUNTER — Other Ambulatory Visit: Payer: Self-pay | Admitting: Radiation Oncology

## 2017-09-14 DIAGNOSIS — C50412 Malignant neoplasm of upper-outer quadrant of left female breast: Secondary | ICD-10-CM

## 2017-09-14 DIAGNOSIS — Z170 Estrogen receptor positive status [ER+]: Principal | ICD-10-CM

## 2017-09-14 DIAGNOSIS — J189 Pneumonia, unspecified organism: Secondary | ICD-10-CM | POA: Insufficient documentation

## 2017-09-14 DIAGNOSIS — R05 Cough: Secondary | ICD-10-CM | POA: Diagnosis present

## 2017-09-14 DIAGNOSIS — Z510 Encounter for antineoplastic radiation therapy: Secondary | ICD-10-CM | POA: Diagnosis not present

## 2017-09-14 MED ORDER — METHYLPREDNISOLONE 4 MG PO TBPK: ORAL_TABLET | ORAL | 0 refills | Status: DC

## 2017-09-14 MED ORDER — LEVOFLOXACIN 750 MG PO TABS: 750.0000 mg | ORAL_TABLET | Freq: Every day | ORAL | 0 refills | Status: DC

## 2017-09-14 NOTE — Progress Notes (Signed)
The patient was seen today for a work in visit again complaining of persistent cough despite amoxicillin, Tessalon Perles, and Robitussin.  She is remaining afebrile.  She does not have any exam findings concerning for wheezing or crackles as she did last week, however a chest x-ray was performed today with increased parenchymal density in the left lower lobe worrisome for pneumonia.  After weighing the risks and benefits of Levaquin with her antidepressant, we discussed therapy and she is in agreement to try this.  She is going to take her Effexor every other day to avoid QT interval change.  She does understand to be seen urgently if she develops any chest pain or trouble breathing.  Her prescriptions were called to the pharmacy and in addition to [place] provided her a Medrol Dosepak.

## 2017-09-16 ENCOUNTER — Ambulatory Visit
Admission: RE | Admit: 2017-09-16 | Discharge: 2017-09-16 | Disposition: A | Discharge status: Home / Self Care | Payer: 59 | Source: Ambulatory Visit | Attending: Radiation Oncology | Admitting: Radiation Oncology

## 2017-09-16 DIAGNOSIS — Z510 Encounter for antineoplastic radiation therapy: Secondary | ICD-10-CM | POA: Diagnosis not present

## 2017-09-17 ENCOUNTER — Ambulatory Visit
Admission: RE | Admit: 2017-09-17 | Discharge: 2017-09-17 | Disposition: A | Discharge status: Home / Self Care | Payer: 59 | Source: Ambulatory Visit | Attending: Radiation Oncology | Admitting: Radiation Oncology

## 2017-09-17 DIAGNOSIS — Z510 Encounter for antineoplastic radiation therapy: Secondary | ICD-10-CM | POA: Diagnosis not present

## 2017-09-18 ENCOUNTER — Ambulatory Visit
Admission: RE | Admit: 2017-09-18 | Discharge: 2017-09-18 | Disposition: A | Discharge status: Home / Self Care | Payer: 59 | Source: Ambulatory Visit | Attending: Radiation Oncology | Admitting: Radiation Oncology

## 2017-09-18 DIAGNOSIS — C50412 Malignant neoplasm of upper-outer quadrant of left female breast: Secondary | ICD-10-CM

## 2017-09-18 DIAGNOSIS — C50511 Malignant neoplasm of lower-outer quadrant of right female breast: Secondary | ICD-10-CM

## 2017-09-18 DIAGNOSIS — Z510 Encounter for antineoplastic radiation therapy: Secondary | ICD-10-CM | POA: Diagnosis not present

## 2017-09-18 DIAGNOSIS — Z170 Estrogen receptor positive status [ER+]: Principal | ICD-10-CM

## 2017-09-20 ENCOUNTER — Ambulatory Visit: Payer: 59

## 2017-09-21 ENCOUNTER — Ambulatory Visit: Payer: 59

## 2017-09-21 ENCOUNTER — Ambulatory Visit
Admission: RE | Admit: 2017-09-21 | Discharge: 2017-09-21 | Disposition: A | Discharge status: Home / Self Care | Payer: 59 | Source: Ambulatory Visit | Attending: Radiation Oncology | Admitting: Radiation Oncology

## 2017-09-21 DIAGNOSIS — Z510 Encounter for antineoplastic radiation therapy: Secondary | ICD-10-CM | POA: Diagnosis not present

## 2017-09-22 ENCOUNTER — Ambulatory Visit: Payer: 59

## 2017-09-22 ENCOUNTER — Ambulatory Visit
Admission: RE | Admit: 2017-09-22 | Discharge: 2017-09-22 | Disposition: A | Discharge status: Home / Self Care | Payer: 59 | Source: Ambulatory Visit | Attending: Radiation Oncology | Admitting: Radiation Oncology

## 2017-09-22 DIAGNOSIS — Z510 Encounter for antineoplastic radiation therapy: Secondary | ICD-10-CM | POA: Diagnosis not present

## 2017-09-23 ENCOUNTER — Ambulatory Visit
Admission: RE | Admit: 2017-09-23 | Discharge: 2017-09-23 | Disposition: A | Discharge status: Home / Self Care | Payer: 59 | Source: Ambulatory Visit | Attending: Radiation Oncology | Admitting: Radiation Oncology

## 2017-09-23 ENCOUNTER — Ambulatory Visit: Payer: 59

## 2017-09-23 DIAGNOSIS — Z510 Encounter for antineoplastic radiation therapy: Secondary | ICD-10-CM | POA: Diagnosis not present

## 2017-09-24 ENCOUNTER — Ambulatory Visit: Payer: 59

## 2017-09-24 ENCOUNTER — Ambulatory Visit
Admission: RE | Admit: 2017-09-24 | Discharge: 2017-09-24 | Disposition: A | Discharge status: Home / Self Care | Payer: 59 | Source: Ambulatory Visit | Attending: Radiation Oncology | Admitting: Radiation Oncology

## 2017-09-24 DIAGNOSIS — Z510 Encounter for antineoplastic radiation therapy: Secondary | ICD-10-CM | POA: Diagnosis not present

## 2017-09-25 ENCOUNTER — Ambulatory Visit: Payer: 59

## 2017-09-25 ENCOUNTER — Ambulatory Visit
Admission: RE | Admit: 2017-09-25 | Discharge: 2017-09-25 | Disposition: A | Discharge status: Home / Self Care | Payer: 59 | Source: Ambulatory Visit | Attending: Radiation Oncology | Admitting: Radiation Oncology

## 2017-09-25 DIAGNOSIS — Z170 Estrogen receptor positive status [ER+]: Principal | ICD-10-CM

## 2017-09-25 DIAGNOSIS — C50511 Malignant neoplasm of lower-outer quadrant of right female breast: Secondary | ICD-10-CM

## 2017-09-25 DIAGNOSIS — Z510 Encounter for antineoplastic radiation therapy: Secondary | ICD-10-CM | POA: Diagnosis not present

## 2017-09-25 MED ORDER — RADIAPLEXRX EX GEL
Freq: Once | CUTANEOUS | Status: AC
  Administered 2017-09-25: 17:00:00 via TOPICAL

## 2017-09-28 ENCOUNTER — Ambulatory Visit: Payer: 59

## 2017-09-28 ENCOUNTER — Telehealth: Payer: Self-pay | Admitting: Family Medicine

## 2017-09-28 ENCOUNTER — Ambulatory Visit
Admission: RE | Admit: 2017-09-28 | Discharge: 2017-09-28 | Disposition: A | Discharge status: Home / Self Care | Payer: 59 | Source: Ambulatory Visit | Attending: Radiation Oncology | Admitting: Radiation Oncology

## 2017-09-28 DIAGNOSIS — Z510 Encounter for antineoplastic radiation therapy: Secondary | ICD-10-CM | POA: Diagnosis not present

## 2017-09-28 NOTE — Telephone Encounter (Signed)
Copied from [place] [id number]. Topic: Quick Communication - Rx Refill/Question >> Sep 28, 2017  2:37 PM Scherrie Gerlach wrote: Medication: venlafaxine (EFFEXOR) 37.5 MG tablet  (pt takes 2 a day) Pt states she discussed with the dr about increasing this med. Pt would like to proceed with an increase.  [facility] [id number], [place] [address] [phone] (Phone) [phone] (Fax)

## 2017-09-28 NOTE — Telephone Encounter (Signed)
Left message on patient's voicemail to return call

## 2017-09-28 NOTE — Telephone Encounter (Signed)
Please clarify- is she taking 1 pill twice daily or 2 pills twice daily (per instructions)  Thanks- I will advise from there/just wanted to make sure

## 2017-09-29 ENCOUNTER — Ambulatory Visit
Admission: RE | Admit: 2017-09-29 | Discharge: 2017-09-29 | Disposition: A | Discharge status: Home / Self Care | Payer: 59 | Source: Ambulatory Visit | Attending: Radiation Oncology | Admitting: Radiation Oncology

## 2017-09-29 ENCOUNTER — Telehealth: Payer: Self-pay | Admitting: Hematology and Oncology

## 2017-09-29 ENCOUNTER — Inpatient Hospital Stay: Payer: 59 | Attending: Hematology and Oncology | Admitting: Hematology and Oncology

## 2017-09-29 ENCOUNTER — Ambulatory Visit: Payer: 59

## 2017-09-29 DIAGNOSIS — C50412 Malignant neoplasm of upper-outer quadrant of left female breast: Secondary | ICD-10-CM

## 2017-09-29 DIAGNOSIS — D0511 Intraductal carcinoma in situ of right breast: Secondary | ICD-10-CM | POA: Diagnosis not present

## 2017-09-29 DIAGNOSIS — Z79810 Long term (current) use of selective estrogen receptor modulators (SERMs): Secondary | ICD-10-CM | POA: Diagnosis not present

## 2017-09-29 DIAGNOSIS — Z923 Personal history of irradiation: Secondary | ICD-10-CM

## 2017-09-29 DIAGNOSIS — Z170 Estrogen receptor positive status [ER+]: Secondary | ICD-10-CM

## 2017-09-29 DIAGNOSIS — Z510 Encounter for antineoplastic radiation therapy: Secondary | ICD-10-CM | POA: Diagnosis not present

## 2017-09-29 MED ORDER — TAMOXIFEN CITRATE 20 MG PO TABS: 20.0000 mg | ORAL_TABLET | Freq: Every day | ORAL | 3 refills | Status: DC

## 2017-09-29 NOTE — Telephone Encounter (Signed)
Patient declined AVs and calendar of upcoming May appointments. °

## 2017-09-29 NOTE — Assessment & Plan Note (Signed)
06/19/2017: Left lumpectomy: IDC with DCIS, 2.1 cm, 0/9 lymph nodes negative margins negative; ER 95%, PR 95%, HER-2 negative ratio 1.32, Ki-67 20%, T2N0 stage Ib;  Right lumpectomy: IDC with DCIS 1.8 cm, focally involving lateral and anterior margins, 0/3 lymph nodes negative, reexcision anterior and lateral margins DCIS, ER 100%, PR 20%, HER-2 negative ratio 1.23, Ki-67 3%, T1CN0 stage I a  Oncotype DX recurrence score 17: Risk of recurrence 11% with tamoxifen alone There was not enough material to send Oncotype on the right breast. Adjuvant radiation therapy 08/13/2017-09/29/2017  Treatment plan: Resume adjuvant antiestrogen therapy with tamoxifen.  She took tamoxifen preoperatively and she tolerated it fairly well except for hot flashes.  Return to clinic in 3 months for survivorship care plan visit

## 2017-09-29 NOTE — Progress Notes (Addendum)
Patient Care Team: Tower, Wynelle Fanny, MD as PCP - General (Family Medicine) Rolm Bookbinder, MD as Consulting Physician (General Surgery) Nicholas Lose, MD as Consulting Physician (Hematology and Oncology) Kyung Rudd, MD as Consulting Physician (Radiation Oncology)  DIAGNOSIS:  Encounter Diagnosis  Name Primary?  . Malignant neoplasm of upper-outer quadrant of left breast in female, estrogen receptor positive ([place])     SUMMARY OF ONCOLOGIC HISTORY:   Malignant neoplasm of upper-outer quadrant of left breast in female, estrogen receptor positive ([place])   05/13/2017 Initial Diagnosis    Palpable left breast mass with distortion at 1:30 position: 1.1 cm with left axillary lymph node, 3.5 mm cortex, biopsy tubular 05/21/2017. Biopsy of the breast mass revealed grade 1-2 IDC with DCIS ER 95%, PR 95%, Ki-67 20%, HER-2 negative ratio 1.32 T1c N0 stage IA      05/26/2017 Genetic Testing    Negative genetic testing on the 9-gene STAT panel.The STAT Breast cancer panel offered by [facility] includes sequencing and rearrangement analysis for the following 9 genes:  ATM, BRCA1, BRCA2, CDH1, CHEK2, PALB2, PTEN, STK11 and TP53.   The report date is 05/26/2017.  Negative genetic testing on the reflexed common hereditary cancer panel.  The Hereditary Gene Panel offered by [facility] includes sequencing and/or deletion duplication testing of the following 46 genes: APC, ATM, AXIN2, BARD1, BMPR1A, BRCA1, BRCA2, BRIP1, CDH1, CDKN2A (p14ARF), CDKN2A (p16INK4a), CHEK2, CTNNA1, DICER1, EPCAM (Deletion/duplication testing only), GREM1 (promoter region deletion/duplication testing only), KIT, MEN1, MLH1, MSH2, MSH3, MSH6, MUTYH, NBN, NF1, NHTL1, PALB2, PDGFRA, PMS2, POLD1, POLE, PTEN, RAD50, RAD51C, RAD51D, SDHB, SDHC, SDHD, SMAD4, SMARCA4. STK11, TP53, TSC1, TSC2, and VHL.  The following genes were evaluated for sequence changes only: SDHA and HOXB13 c.251G>A variant only.  The report date is May 26, 2017.      06/19/2017 Surgery    Left lumpectomy: IDC with DCIS, 2.1 cm, 0/9 lymph nodes negative margins negative; ER 95%, PR 95%, HER-2 negative ratio 1.32, Ki-67 20%, T2N0 stage Ib; Right lumpectomy: IDC with DCIS 1.8 cm, focally involving lateral and anterior margins, 0/3 lymph nodes negative, ER 100%, PR 20%, HER-2 negative ratio 1.23, Ki-67 3%, T1CN0 stage I a      06/19/2017 Oncotype testing    Oncotype DX score 17: Risk of recurrence 11% with tamoxifen alone      07/07/2017 Surgery    Reexcision lateral margin: Residual DCIS intermediate grade, reexcision anterior margin: [facility]      08/13/2017 - 09/29/2017 Radiation Therapy    Adjuvant radiation therapy       CHIEF COMPLIANT: Follow-up after adjuvant radiation therapy  INTERVAL HISTORY: Margaret Rojas is a 42 year old with above-mentioned history of left breast cancer treated with lumpectomy followed by adjuvant radiation.  She is currently undergoing radiation tomorrow will be the last day of radiation.  She is experiencing a lot of burning sensation accompanied by pain and discomfort related to radiation but overall she is tolerating it fairly well.  She had an episode of pneumonia which required couple of antibiotics to treat.  She is expected to have another chest x-ray soon.  REVIEW OF SYSTEMS:   Constitutional: Denies fevers, chills or abnormal weight loss Eyes: Denies blurriness of vision Ears, nose, mouth, throat, and face: Denies mucositis or sore throat Respiratory: Denies cough, dyspnea or wheezes Cardiovascular: Denies palpitation, chest discomfort Gastrointestinal:  Denies nausea, heartburn or change in bowel habits Skin: Denies abnormal skin rashes Lymphatics: Denies new lymphadenopathy or easy bruising Neurological:Denies numbness, tingling or new weaknesses  Behavioral/Psych: Mood is stable, no new changes  Extremities: No lower extremity edema Breast: Radiation dermatitis All other systems were reviewed with the  patient and are negative.  I have reviewed the past medical history, past surgical history, social history and family history with the patient and they are unchanged from previous note.  ALLERGIES:  is allergic to pneumovax [pneumococcal polysaccharide vaccine]; sulfonamide derivatives; and epinephrine.  MEDICATIONS:  Current Outpatient Medications  Medication Sig Dispense Refill  . acetaminophen (TYLENOL) 500 MG tablet Take 1,500 mg by mouth 3 (three) times daily as needed for mild pain.     Marland Kitchen ALPRAZolam (XANAX) 1 MG tablet Take 1 mg by mouth daily as needed for anxiety.    . benzonatate (TESSALON) 100 MG capsule Take 1 capsule (100 mg total) by mouth 3 (three) times daily as needed for cough. 30 capsule 0  . cyclobenzaprine (FLEXERIL) 10 MG tablet Take 10 mg by mouth 2 (two) times daily as needed for muscle spasms (migraines).     Marland Kitchen guaiFENesin-codeine 100-10 MG/5ML syrup Take 10 mLs by mouth every 6 (six) hours as needed for cough. 240 mL 0  . HYDROcodone-acetaminophen (NORCO) 10-325 MG tablet Take 1 tablet by mouth every 6 (six) hours as needed. 15 tablet 0  . HYDROcodone-acetaminophen (NORCO) 7.5-325 MG tablet Take 1 tablet by mouth every 6 (six) hours as needed for pain.    Marland Kitchen levofloxacin (LEVAQUIN) 750 MG tablet Take 1 tablet (750 mg total) by mouth daily. 10 tablet 0  . levonorgestrel (MIRENA, 52 MG,) 20 JME/26ST IUD 1 application by Intrauterine route.    . methylPREDNISolone (MEDROL DOSEPAK) 4 MG TBPK tablet 6 tabs po day 1, 5 tabs po day 2, 4 tabs po day 3, 3 tabs po day 4, 2 tabs po day 5, 1 tab po day 6 21 tablet 0  . Multiple Vitamins-Minerals (EMERGEN-C VITAMIN C) PACK Take 1 packet by mouth daily.    . Pediatric Multivit-Minerals-C (CHILDRENS GUMMIES) CHEW Chew 2 each by mouth daily.    . tamoxifen (NOLVADEX) 20 MG tablet TAKE 1 TABLET BY MOUTH EVERY DAY (Patient not taking: Reported on 07/21/2017) 30 tablet 0  . tiZANidine (ZANAFLEX) 2 MG tablet Take 1-4 mg by mouth every 8  hours when necessary pain and spasm (Patient not taking: Reported on 07/21/2017) 30 tablet 0  . topiramate (TOPAMAX) 100 MG tablet Take 100 mg by mouth at bedtime.     Marland Kitchen venlafaxine (EFFEXOR) 37.5 MG tablet Take 2 tablets (75 mg total) 2 (two) times daily by mouth. 60 tablet 3  . Wound Dressings (SONAFINE) Apply 1 application topically 2 (two) times daily.    Marland Kitchen zolpidem (AMBIEN) 10 MG tablet Take 1 tablet (10 mg total) by mouth at bedtime as needed. for sleep 30 tablet 3   No current facility-administered medications for this visit.     PHYSICAL EXAMINATION: ECOG PERFORMANCE STATUS: 1 - Symptomatic but completely ambulatory  Vitals:   09/29/17 1406  BP: 116/78  Pulse: 85  Resp: 18  Temp: 98.2 F (36.8 C)  SpO2: 99%   Filed Weights   09/29/17 1406  Weight: 199 lb 8 oz (90.5 kg)    GENERAL:alert, no distress and comfortable SKIN: skin color, texture, turgor are normal, no rashes or significant lesions EYES: normal, Conjunctiva are pink and non-injected, sclera clear OROPHARYNX:no exudate, no erythema and lips, buccal mucosa, and tongue normal  NECK: supple, thyroid normal size, non-tender, without nodularity LYMPH:  no palpable lymphadenopathy in the cervical, axillary or  inguinal LUNGS: clear to auscultation and percussion with normal breathing effort HEART: regular rate & rhythm and no murmurs and no lower extremity edema ABDOMEN:abdomen soft, non-tender and normal bowel sounds MUSCULOSKELETAL:no cyanosis of digits and no clubbing  NEURO: alert & oriented x 3 with fluent speech, no focal motor/sensory deficits EXTREMITIES: No lower extremity edema  LABORATORY DATA:  I have reviewed the data as listed CMP Latest Ref Rng & Units 06/19/2017 06/02/2017 05/20/2017  Glucose 65 - 99 mg/dL 75 88 123  BUN 6 - 20 mg/dL 13 17 13.7  Creatinine 0.44 - 1.00 mg/dL 0.67 0.90 1.0  Sodium 135 - 145 mmol/L 135 139 140  Potassium 3.5 - 5.1 mmol/L 3.6 3.8 3.5  Chloride 101 - 111 mmol/L 110 109 -   CO2 22 - 32 mmol/L '22 22 23  '$ Calcium 8.9 - 10.3 mg/dL 8.1(L) 9.0 9.4  Total Protein 6.4 - 8.3 g/dL - - 7.5  Total Bilirubin 0.20 - 1.20 mg/dL - - 0.48  Alkaline Phos 40 - 150 U/L - - 62  AST 5 - 34 U/L - - 15  ALT 0 - 55 U/L - - 12    Lab Results  Component Value Date   WBC 8.6 06/19/2017   HGB 12.5 06/19/2017   HCT 39.8 06/19/2017   MCV 93.0 06/19/2017   PLT 217 06/19/2017   NEUTROABS 4.0 05/20/2017    ASSESSMENT & PLAN:  Malignant neoplasm of upper-outer quadrant of left breast in female, estrogen receptor positive ([place]) 06/19/2017: Left lumpectomy: IDC with DCIS, 2.1 cm, 0/9 lymph nodes negative margins negative; ER 95%, PR 95%, HER-2 negative ratio 1.32, Ki-67 20%, T2N0 stage Ib;  Right lumpectomy: IDC with DCIS 1.8 cm, focally involving lateral and anterior margins, 0/3 lymph nodes negative, reexcision anterior and lateral margins DCIS, ER 100%, PR 20%, HER-2 negative ratio 1.23, Ki-67 3%, T1CN0 stage I a  Oncotype DX recurrence score 17: Risk of recurrence 11% with tamoxifen alone There was not enough material to send Oncotype on the right breast. Adjuvant radiation therapy 08/13/2017-09/29/2017  Treatment plan: Resume adjuvant antiestrogen therapy with tamoxifen.  She took tamoxifen preoperatively and she tolerated it fairly well except for hot flashes.  She will resume tamoxifen at 10/30/2017  Surveillance plan: Mammogram to be obtained sometime in July and will be done annually.  She will have a breast MRI in January 2020 and then MRIs to be done every couple of years.  This is primarily because her right breast cancer was not visible through regular mammograms.  Return to clinic in 4 months for survivorship care plan visit  I spent 25 minutes talking to the patient of which more than half was spent in counseling and coordination of care.  No orders of the defined types were placed in this encounter.  The patient has a good understanding of the overall plan. she agrees  with it. she will call with any problems that may develop before the next visit here.   Harriette Ohara, MD 09/29/17

## 2017-09-30 ENCOUNTER — Ambulatory Visit: Payer: 59

## 2017-09-30 ENCOUNTER — Encounter: Payer: Self-pay | Admitting: Radiation Oncology

## 2017-09-30 ENCOUNTER — Ambulatory Visit
Admission: RE | Admit: 2017-09-30 | Discharge: 2017-09-30 | Disposition: A | Discharge status: Home / Self Care | Payer: 59 | Source: Ambulatory Visit | Attending: Radiation Oncology | Admitting: Radiation Oncology

## 2017-09-30 DIAGNOSIS — Z510 Encounter for antineoplastic radiation therapy: Secondary | ICD-10-CM | POA: Diagnosis not present

## 2017-10-01 ENCOUNTER — Ambulatory Visit: Payer: 59

## 2017-10-01 MED ORDER — VENLAFAXINE HCL 75 MG PO TABS: 75.0000 mg | ORAL_TABLET | Freq: Two times a day (BID) | ORAL | 11 refills | Status: DC

## 2017-10-01 NOTE — Telephone Encounter (Signed)
Pt is currently taking 2 pills at 1 time a day . She will be starting tomoxazan 10/30/17 for cancer

## 2017-10-01 NOTE — Telephone Encounter (Signed)
We will increase it to 75 mg twice daily instead of once  I sent it to her pharmacy  I hope this helps  Keep [place] posted

## 2017-10-01 NOTE — Progress Notes (Signed)
  Radiation Oncology         [phone] ________________________________  Name: Margaret Rojas MRN: [id number]  Date: 09/30/2017  DOB: 1976/08/30  End of Treatment Note  Diagnosis:   42 y.o. female with Stage IA, pT1cN0M0 grade 1, ER/PR positive invasive ductal carcinoma with DCIS of the right breast and synchronous Stage IB, pT2N0M0 grade 1, ER/PR positive invasive ductal carcinoma with DCIS of the left breast    Indication for treatment:  Curative       Radiation treatment dates:   08/13/2017 - 09/30/2017  Site/dose:   The patient received a dose of 50.4 Gy in 28 fractions to the left breast using whole-breast tangent fields. This was delivered using a 3-D conformal technique. The patient then received a boost to the seroma. This delivered an additional 10 Gy in 5 fractions using a 3-D technique. The total dose was 60.4 Gy.  The patient received a dose of 50 Gy in 25 fractions to the right breast using whole-breast tangent fields. This was delivered using a 3-D conformal technique. The patient then received a boost to the seroma. This delivered an additional 14.4 Gy in 8 fractions using a 3-D technique. The total dose was 64.4 Gy.  Narrative: The patient tolerated radiation treatment relatively well.   The patient had some expected skin irritation as she progressed during treatment. Moist desquamation was not present at the end of treatment.  Plan: The patient has completed radiation treatment. The patient will return to radiation oncology clinic for routine followup in one month. I advised the patient to call or return sooner if they have any questions or concerns related to their recovery or treatment. ________________________________  Jodelle Gross, MD, PhD  This document serves as a record of services personally performed by Kyung Rudd, MD. It was created on his behalf by Rae Lips, a trained medical scribe. The creation of this record is based on the scribe's personal  observations and the provider's statements to them. This document has been checked and approved by the attending provider.

## 2017-10-02 ENCOUNTER — Ambulatory Visit: Payer: 59

## 2017-10-02 NOTE — Telephone Encounter (Signed)
Pt notified new Rx sent to pharmacy  

## 2017-10-05 ENCOUNTER — Ambulatory Visit: Payer: 59

## 2017-10-13 NOTE — Progress Notes (Signed)
  Radiation Oncology         [phone] ________________________________  Name: Margaret Rojas MRN: [id number]  Date: 08/04/2017  DOB: 09-26-1975  DIAGNOSIS:     ICD-10-CM   1. Malignant neoplasm of upper-outer quadrant of left breast in female, estrogen receptor positive ([place]) C50.412    Z17.0   2. Malignant neoplasm of lower-outer quadrant of right breast of female, estrogen receptor positive ([place]) C50.511    Z17.0      SIMULATION AND TREATMENT PLANNING NOTE  The patient presented for simulation prior to beginning her course of radiation treatment for her diagnosis of bilateral-sided breast cancer. The patient was placed in a supine position on a breast board. A customized vac-lock bag was constructed and this complex treatment device will be used on a daily basis during her treatment. In this fashion, a CT scan was obtained through the chest area and an isocenter was placed near the chest wall within the breast.  The patient will be planned to receive a course of radiation initially to a dose of 50.4 Gy to the left breast and 50 Gy to the right breast. This will consist of a whole breast radiotherapy technique. To accomplish this, 2 customized blocks have been designed which will correspond to medial and lateral whole breast tangent fields. This treatment will be accomplished at 1.8 Gy per fraction and 2 Gy per fraction, respectively. A forward planning technique will also be evaluated to determine if this approach improves the plan. It is anticipated that the patient will then receive a 10 Gy boost to the seroma cavity on the left and 14.4 Gy on the right, which has been contoured. This will be accomplished at 2 Gy per fraction and 1.8 Gy per fraction, respectively.   This initial treatment will consist of a 3-D conformal technique. The seroma has been contoured as the primary target structure. Additionally, dose volume histograms of both this target as well as the lungs and heart  will also be evaluated. Such an approach is necessary to ensure that the target area is adequately covered while the nearby critical  normal structures are adequately spared.  Plan:  The final anticipated total dose therefore will correspond to 60.4 Gy to the left breast and 64.4 Gy to the right breast.    _______________________________   Jodelle Gross, MD, PhD

## 2017-10-15 ENCOUNTER — Encounter: Payer: Self-pay | Admitting: Physical Therapy

## 2017-10-15 ENCOUNTER — Ambulatory Visit: Payer: 59 | Attending: General Surgery | Admitting: Physical Therapy

## 2017-10-15 DIAGNOSIS — R293 Abnormal posture: Secondary | ICD-10-CM | POA: Diagnosis present

## 2017-10-15 DIAGNOSIS — Z483 Aftercare following surgery for neoplasm: Secondary | ICD-10-CM | POA: Diagnosis present

## 2017-10-15 DIAGNOSIS — M25611 Stiffness of right shoulder, not elsewhere classified: Secondary | ICD-10-CM | POA: Diagnosis present

## 2017-10-15 DIAGNOSIS — M25612 Stiffness of left shoulder, not elsewhere classified: Secondary | ICD-10-CM

## 2017-10-15 NOTE — Progress Notes (Signed)
Simulation verification  The patient was brought to the treatment machine and placed in the plan treatment position.  Clinical set up was verified to ensure that the target region is appropriately covered for the patient's upcoming electron boost treatment for bilateral breast cancer.  The targeted volume of tissue is appropriately covered by the radiation field.  Based on my personal review, I approve the simulation verification.  The patient's treatment will proceed as planned.  ------------------------------------------------  Jodelle Gross, MD, PhD
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
## 2017-10-19 ENCOUNTER — Ambulatory Visit: Payer: 59 | Attending: General Surgery

## 2017-10-19 DIAGNOSIS — Z483 Aftercare following surgery for neoplasm: Secondary | ICD-10-CM | POA: Diagnosis present

## 2017-10-19 DIAGNOSIS — M79601 Pain in right arm: Secondary | ICD-10-CM | POA: Insufficient documentation

## 2017-10-19 DIAGNOSIS — M25612 Stiffness of left shoulder, not elsewhere classified: Secondary | ICD-10-CM | POA: Diagnosis not present

## 2017-10-19 DIAGNOSIS — I890 Lymphedema, not elsewhere classified: Secondary | ICD-10-CM | POA: Insufficient documentation

## 2017-10-19 DIAGNOSIS — R293 Abnormal posture: Secondary | ICD-10-CM | POA: Insufficient documentation

## 2017-10-19 DIAGNOSIS — M25611 Stiffness of right shoulder, not elsewhere classified: Secondary | ICD-10-CM | POA: Diagnosis present

## 2017-10-19 DIAGNOSIS — M79602 Pain in left arm: Secondary | ICD-10-CM | POA: Diagnosis present
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
## 2017-10-21 ENCOUNTER — Ambulatory Visit: Payer: 59

## 2017-10-21 DIAGNOSIS — R293 Abnormal posture: Secondary | ICD-10-CM

## 2017-10-21 DIAGNOSIS — Z483 Aftercare following surgery for neoplasm: Secondary | ICD-10-CM

## 2017-10-21 DIAGNOSIS — M25612 Stiffness of left shoulder, not elsewhere classified: Secondary | ICD-10-CM | POA: Diagnosis not present

## 2017-10-21 DIAGNOSIS — M79602 Pain in left arm: Secondary | ICD-10-CM
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
## 2017-10-26 ENCOUNTER — Ambulatory Visit: Payer: 59

## 2017-10-26 DIAGNOSIS — R293 Abnormal posture: Secondary | ICD-10-CM

## 2017-10-26 DIAGNOSIS — Z483 Aftercare following surgery for neoplasm: Secondary | ICD-10-CM

## 2017-10-26 DIAGNOSIS — M25612 Stiffness of left shoulder, not elsewhere classified: Secondary | ICD-10-CM

## 2017-10-26 DIAGNOSIS — M79602 Pain in left arm: Secondary | ICD-10-CM
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
## 2017-10-29 ENCOUNTER — Ambulatory Visit: Payer: 59 | Admitting: Physical Therapy

## 2017-10-29 ENCOUNTER — Encounter: Payer: Self-pay | Admitting: Physical Therapy

## 2017-10-29 DIAGNOSIS — Z483 Aftercare following surgery for neoplasm: Secondary | ICD-10-CM

## 2017-10-29 DIAGNOSIS — I890 Lymphedema, not elsewhere classified: Secondary | ICD-10-CM

## 2017-10-29 DIAGNOSIS — M79602 Pain in left arm: Secondary | ICD-10-CM

## 2017-10-29 DIAGNOSIS — R293 Abnormal posture: Secondary | ICD-10-CM

## 2017-10-29 DIAGNOSIS — M25612 Stiffness of left shoulder, not elsewhere classified: Secondary | ICD-10-CM | POA: Diagnosis not present

## 2017-10-29 DIAGNOSIS — M25611 Stiffness of right shoulder, not elsewhere classified: Secondary | ICD-10-CM

## 2017-10-29 DIAGNOSIS — M79601 Pain in right arm: Secondary | ICD-10-CM
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
## 2017-11-02 ENCOUNTER — Encounter: Payer: Self-pay | Admitting: Physical Therapy

## 2017-11-02 ENCOUNTER — Ambulatory Visit: Payer: 59 | Admitting: Physical Therapy

## 2017-11-02 DIAGNOSIS — M25612 Stiffness of left shoulder, not elsewhere classified: Secondary | ICD-10-CM

## 2017-11-02 DIAGNOSIS — R293 Abnormal posture: Secondary | ICD-10-CM

## 2017-11-02 DIAGNOSIS — M25611 Stiffness of right shoulder, not elsewhere classified: Secondary | ICD-10-CM
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
## 2017-11-03 ENCOUNTER — Telehealth: Payer: Self-pay | Admitting: Radiation Oncology

## 2017-11-03 NOTE — Telephone Encounter (Signed)
LM for pt to return my call

## 2017-11-04 ENCOUNTER — Ambulatory Visit: Payer: 59

## 2017-11-04 DIAGNOSIS — M25612 Stiffness of left shoulder, not elsewhere classified: Secondary | ICD-10-CM

## 2017-11-04 DIAGNOSIS — M79601 Pain in right arm: Secondary | ICD-10-CM

## 2017-11-04 DIAGNOSIS — M79602 Pain in left arm: Secondary | ICD-10-CM

## 2017-11-04 DIAGNOSIS — R293 Abnormal posture: Secondary | ICD-10-CM

## 2017-11-04 DIAGNOSIS — I890 Lymphedema, not elsewhere classified: Secondary | ICD-10-CM

## 2017-11-04 DIAGNOSIS — Z483 Aftercare following surgery for neoplasm: Secondary | ICD-10-CM

## 2017-11-04 DIAGNOSIS — M25611 Stiffness of right shoulder, not elsewhere classified: Secondary | ICD-10-CM
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
## 2017-11-05 ENCOUNTER — Telehealth: Payer: Self-pay | Admitting: Radiation Oncology

## 2017-11-05 DIAGNOSIS — J181 Lobar pneumonia, unspecified organism: Principal | ICD-10-CM

## 2017-11-05 DIAGNOSIS — J189 Pneumonia, unspecified organism: Secondary | ICD-10-CM

## 2017-11-05 NOTE — Telephone Encounter (Signed)
I spoke with the patient regarding her follow up. I wanted to make sure she has a CXR to document improvement in the previously noted area of pneumonia. She will go for this between now and Monday's appointment. We also discussed that she's concerned by fullness in her left axilla and she's unsure if this is due to a mass or lymph node, or trigger point/cording. She also reports changes in cognition since starting tamoxifen. We will follow up with her on Monday.

## 2017-11-05 NOTE — Telephone Encounter (Signed)
LM for pt to call me

## 2017-11-06 ENCOUNTER — Telehealth: Payer: Self-pay | Admitting: *Deleted

## 2017-11-06 ENCOUNTER — Ambulatory Visit
Admission: RE | Admit: 2017-11-06 | Discharge: 2017-11-06 | Disposition: A | Discharge status: Home / Self Care | Payer: 59 | Source: Ambulatory Visit | Attending: Radiation Oncology | Admitting: Radiation Oncology

## 2017-11-06 DIAGNOSIS — J189 Pneumonia, unspecified organism: Secondary | ICD-10-CM

## 2017-11-06 DIAGNOSIS — J181 Lobar pneumonia, unspecified organism: Principal | ICD-10-CM

## 2017-11-06 NOTE — Telephone Encounter (Signed)
CALLED PATIENT TO INFORM OF CHEST X-RAY @ [facility], PT. CAN GO TODAY AND HAVE THIS DONE, SPOKE WITH PATIENT AND SHE IS AWARE OF THIS

## 2017-11-09 ENCOUNTER — Other Ambulatory Visit: Payer: Self-pay

## 2017-11-09 ENCOUNTER — Ambulatory Visit
Admission: RE | Admit: 2017-11-09 | Discharge: 2017-11-09 | Disposition: A | Discharge status: Home / Self Care | Payer: 59 | Source: Ambulatory Visit | Attending: Radiation Oncology | Admitting: Radiation Oncology

## 2017-11-09 ENCOUNTER — Encounter: Payer: Self-pay | Admitting: Radiation Oncology

## 2017-11-09 VITALS — BP 143/96 | HR 80 | Temp 98.2°F | Resp 20 | Ht 68.5 in | Wt 195.2 lb

## 2017-11-09 DIAGNOSIS — R232 Flushing: Secondary | ICD-10-CM | POA: Diagnosis not present

## 2017-11-09 DIAGNOSIS — C50511 Malignant neoplasm of lower-outer quadrant of right female breast: Secondary | ICD-10-CM

## 2017-11-09 DIAGNOSIS — Z79810 Long term (current) use of selective estrogen receptor modulators (SERMs): Secondary | ICD-10-CM | POA: Diagnosis not present

## 2017-11-09 DIAGNOSIS — C50412 Malignant neoplasm of upper-outer quadrant of left female breast: Secondary | ICD-10-CM

## 2017-11-09 DIAGNOSIS — Z923 Personal history of irradiation: Secondary | ICD-10-CM | POA: Diagnosis not present

## 2017-11-09 DIAGNOSIS — I890 Lymphedema, not elsewhere classified: Secondary | ICD-10-CM | POA: Diagnosis not present

## 2017-11-09 DIAGNOSIS — F329 Major depressive disorder, single episode, unspecified: Secondary | ICD-10-CM | POA: Diagnosis not present

## 2017-11-09 DIAGNOSIS — C50911 Malignant neoplasm of unspecified site of right female breast: Secondary | ICD-10-CM | POA: Insufficient documentation

## 2017-11-09 DIAGNOSIS — Z79899 Other long term (current) drug therapy: Secondary | ICD-10-CM | POA: Diagnosis not present

## 2017-11-09 DIAGNOSIS — Z170 Estrogen receptor positive status [ER+]: Secondary | ICD-10-CM | POA: Diagnosis not present

## 2017-11-09 DIAGNOSIS — Z8701 Personal history of pneumonia (recurrent): Secondary | ICD-10-CM | POA: Insufficient documentation

## 2017-11-09 DIAGNOSIS — M79622 Pain in left upper arm: Secondary | ICD-10-CM | POA: Insufficient documentation

## 2017-11-09 NOTE — Progress Notes (Signed)
Radiation Oncology         [phone] ________________________________  Name: Margaret MICHAUX MRN: [id number]  Date of Service: 11/09/2017  DOB: Feb 05, 1976  Post Treatment Note  CC: Tower, Wynelle Fanny, MD  Nicholas Lose, MD  Diagnosis:   Stage IA, pT1cN0M0 grade 1,ER/PR positive invasive ductal carcinoma with DCIS of the rightbreast and synchronous Stage IB, pT2N0M0 grade 1, ER/PR positive invasive ductal carcinoma with DCIS of the left breast.  Interval Since Last Radiation:  6 weeks   08/13/2017 - 09/30/2017: The patient received a dose of 50.4 Gy in 28 fractions to the left breast using whole-breast tangent fields. This was delivered using a 3-D conformal technique. The patient then received a boost to the seroma. This delivered an additional 10 Gy in 5 fractions using a 3-D technique. The total dose was 60.4 Gy.  Narrative:  The patient returns today for routine follow-up. During treatment she did very well with radiotherapy and did not have significant desquamation. She did develop a cough toward the beginning of treatment as well as a fever. Her symptoms progressed with conservative management and she was started empirically on antibiotics, and a CXR confirmed a LLL pneumonia. She had repeat CXR on 11/06/17 and this showed resolution of the LLL findings.                     On review of systems, the patient states she is doing okay. She reports that she is concerned about some side effects of being on tamoxifen and describes hot flashes and some joint aches. She saw her neurosurgeon for spinal injections about a week ago. She reports she will find out more when she sees Dr. Saintclair Halsted again to determine if she needs more back surgery. She reports she is not having any trouble breathing, and is not coughing at this time. She is somewhat discouraged by her sense of apathy regarding how she feels overall and has been meeting with a grief counselor but would like try meeting with a counselor who  can help her manage her diagnosis related stress as well as her family stressors. She also is concerned about fullness in her left axilla. She reports she's working with PT diligently and continues to have some difficulty in range of motion. She is also working on LUE lymphedema and wears her sleeve regularly also.  ALLERGIES:  is allergic to pneumovax [pneumococcal polysaccharide vaccine]; sulfonamide derivatives; and epinephrine.  Meds: Current Outpatient Medications  Medication Sig Dispense Refill  . acetaminophen (TYLENOL) 500 MG tablet Take 1,500 mg by mouth 3 (three) times daily as needed for mild pain.     Marland Kitchen ALPRAZolam (XANAX) 1 MG tablet Take 1 mg by mouth daily as needed for anxiety.    . cyclobenzaprine (FLEXERIL) 10 MG tablet Take 10 mg by mouth 2 (two) times daily as needed for muscle spasms (migraines).     . Multiple Vitamins-Minerals (EMERGEN-C VITAMIN C) PACK Take 1 packet by mouth daily.    . Pediatric Multivit-Minerals-C (CHILDRENS GUMMIES) CHEW Chew 2 each by mouth daily.    . tamoxifen (NOLVADEX) 20 MG tablet Take 1 tablet (20 mg total) by mouth daily. 90 tablet 3  . tiZANidine (ZANAFLEX) 2 MG tablet Take 1-4 mg by mouth every 8 hours when necessary pain and spasm 30 tablet 0  . topiramate (TOPAMAX) 100 MG tablet Take 100 mg by mouth at bedtime.     Marland Kitchen venlafaxine (EFFEXOR) 75 MG tablet Take 1 tablet (75 mg  total) by mouth 2 (two) times daily. (Patient taking differently: Take 150 mg by mouth 2 (two) times daily. ) 60 tablet 11  . zolpidem (AMBIEN) 10 MG tablet Take 1 tablet (10 mg total) by mouth at bedtime as needed. for sleep 30 tablet 3   No current facility-administered medications for this encounter.     Physical Findings:  height is 5' 8.5" (1.74 m) and weight is 195 lb 3.2 oz (88.5 kg). Her oral temperature is 98.2 F (36.8 C). Her blood pressure is 143/96 (abnormal) and her pulse is 80. Her respiration is 20 and oxygen saturation is 100%.  Pain Assessment Pain  Score: 4  Pain Loc: Breast(Left breast)/10 In general this is a well appearing caucasian female in no acute distress. She's alert and oriented x4 and appropriate throughout the examination. Cardiopulmonary assessment is negative for acute distress and she exhibits normal effort. Bilateral breasts were examined and reveal mild hyperpigmentation without desquamation. The left axilla reveals mild fullness at about 2-3 o'clock. No mass is noted, or fluid accumulation per se but is about 2 cm in greatest dimension and I believe this is soft tissue consistent with normal musculature of the axilla. No palpable nodes are otherwise appreciated.    Lab Findings: Lab Results  Component Value Date   WBC 8.6 06/19/2017   HGB 12.5 06/19/2017   HCT 39.8 06/19/2017   MCV 93.0 06/19/2017   PLT 217 06/19/2017     Radiographic Findings: Dg Chest 2 View  Result Date: 11/06/2017 CLINICAL DATA:  Followup left lower lobe pneumonia. Bilateral breast carcinoma. EXAM: CHEST  2 VIEW COMPARISON:  09/14/2017 FINDINGS: The heart size and mediastinal contours are within normal limits. Both lungs are clear. Previously seen airspace disease in left lower lung has resolved. The visualized skeletal structures are unremarkable. IMPRESSION: No active cardiopulmonary disease. Electronically Signed   By: Earle Gell M.D.   On: 11/06/2017 19:48    Impression/Plan: 1. Stage IA, pT1cN0M0 grade 1,ER/PR positive invasive ductal carcinoma with DCIS of the rightbreast and synchronous Stage IB, pT2N0M0 grade 1, ER/PR positive invasive ductal carcinoma with DCIS of the left breast. The patient has been doing well since completion of radiotherapy. We discussed that we would be happy to continue to follow her as needed, but she will also continue to follow up with Dr. Lindi Adie in medical oncology. She was counseled on skin care as well as measures to avoid sun exposure to this area.  2. Depression regarding medical illness.   3. Fullness in  the left axilla. I let the patient know that I think her examination findings and concerns are likely musculoskeletal in nature as she had 9 nodes removed on the left however, to rule out risk of lymphocele, we will proceed with diagnostic axillary ultrasound. I will follow up with her regarding this as well.  4. Lymphedema and limited LUE ROM. The patient will continue meeting with PT and will continue her compression sleeve as well.  5. Prior LLL Pneumonia. This appears to have cleared on CXR. A copy is provided to her.     Carola Rhine, PAC

## 2017-11-10 ENCOUNTER — Encounter: Payer: Self-pay | Admitting: Physical Therapy

## 2017-11-10 ENCOUNTER — Ambulatory Visit: Payer: 59 | Admitting: Physical Therapy

## 2017-11-10 DIAGNOSIS — I890 Lymphedema, not elsewhere classified: Secondary | ICD-10-CM

## 2017-11-10 DIAGNOSIS — M25611 Stiffness of right shoulder, not elsewhere classified: Secondary | ICD-10-CM

## 2017-11-10 DIAGNOSIS — Z483 Aftercare following surgery for neoplasm: Secondary | ICD-10-CM

## 2017-11-10 DIAGNOSIS — M25612 Stiffness of left shoulder, not elsewhere classified: Secondary | ICD-10-CM | POA: Diagnosis not present

## 2017-11-10 DIAGNOSIS — R293 Abnormal posture: Secondary | ICD-10-CM

## 2017-11-10 DIAGNOSIS — M79602 Pain in left arm: Secondary | ICD-10-CM
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
## 2017-11-10 NOTE — Patient Instructions (Signed)
Kinesiotape should stay on your body for a few days.  It's OK to shower with it on.  Just pat it dry afterwards.    While the tape is on, keep an eye on the skin around it.  If you feel any itching, see any redness or feel in any way that the tape is irritating your skin, it is time to gently remove it.   To loosen the tape from your skin, use something oily like olive oil or baby oil on a cotton ball.  Gently roll the edge of the tape away from the skin.  Then, hold the edge of the tape away from the skin and gently press the skin away from the tape.  Do not pull the tape off the skin or you may cause skin irritation.  Then, inspect your skin.  Gently cleanse with soap and water  

## 2017-11-11 ENCOUNTER — Telehealth: Payer: Self-pay | Admitting: General Practice

## 2017-11-11 NOTE — Telephone Encounter (Signed)
[facility] CSW Progress Note  CSW received referral from APP, patient requesting help w managing multiple stressors including but not limited to cancer diagnosis and treatment.  Called patient, explored needs/concerns.  Scheduled appt for 3/1 at 3 PM to discuss further needs and resources.  Edwyna Shell, LCSW Clinical Social Worker Phone:  [phone]

## 2017-11-12 ENCOUNTER — Encounter: Payer: Self-pay | Admitting: Physical Therapy

## 2017-11-12 ENCOUNTER — Other Ambulatory Visit: Payer: Self-pay | Admitting: Neurosurgery

## 2017-11-12 ENCOUNTER — Ambulatory Visit: Payer: 59 | Admitting: Physical Therapy

## 2017-11-12 DIAGNOSIS — M25612 Stiffness of left shoulder, not elsewhere classified: Secondary | ICD-10-CM

## 2017-11-12 DIAGNOSIS — R293 Abnormal posture: Secondary | ICD-10-CM

## 2017-11-12 DIAGNOSIS — I890 Lymphedema, not elsewhere classified: Secondary | ICD-10-CM

## 2017-11-12 DIAGNOSIS — Z483 Aftercare following surgery for neoplasm: Secondary | ICD-10-CM

## 2017-11-12 DIAGNOSIS — M79602 Pain in left arm: Secondary | ICD-10-CM

## 2017-11-12 DIAGNOSIS — M79601 Pain in right arm: Secondary | ICD-10-CM

## 2017-11-12 DIAGNOSIS — M5137 Other intervertebral disc degeneration, lumbosacral region: Secondary | ICD-10-CM

## 2017-11-12 DIAGNOSIS — M25611 Stiffness of right shoulder, not elsewhere classified: Secondary | ICD-10-CM
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
## 2017-11-13 ENCOUNTER — Other Ambulatory Visit: Payer: Self-pay | Admitting: Radiation Oncology

## 2017-11-13 ENCOUNTER — Telehealth: Payer: Self-pay | Admitting: *Deleted

## 2017-11-13 ENCOUNTER — Encounter: Payer: Self-pay | Admitting: General Practice

## 2017-11-13 DIAGNOSIS — C50511 Malignant neoplasm of lower-outer quadrant of right female breast: Secondary | ICD-10-CM

## 2017-11-13 DIAGNOSIS — Z170 Estrogen receptor positive status [ER+]: Principal | ICD-10-CM

## 2017-11-13 NOTE — Progress Notes (Signed)
[place] CSW Progress Note  Date of Session:  11/13/17 Time:  3:00 - 4:00 PM  Participants:  Patient and husband Alessandra Grout  CSW met w patient and husband at patient's request and APP referral.  Patient's stated goals of session include increasing supportive communication between patient and husband and addressing symptoms of depression resulting from prolonged experience of illness/sick role.  CSW discussed goals of brief supportive counseling, assessed patient safety re depression/suicidality, and conducted initial assessment/goals.     Patient's knowledge about cancer and its treatment including level of understanding, reactions, goals for care, and expectations:  Patient has completed radiation and chemotherapy for breast cancer, currently receiving Tamoxifen.  "I know how to be sick, I don't know how to be well."  Patient fearful about reoccurrence of cancer (has upcoming ultrasound for "lump"), angry about having cancer as well as back and foot surgery, frustrated w continued need for appointments/treatments.  Although grateful for help in dealing w cancer, expresses understandable frustration w impact of cancer and treatment on her and her family.   Characteristics of the patient's support system:  Husband has assumed significant caretaker role and wants to be included in patient's care, wants information about pain/symptoms, easily feels helpless in face of symptoms; daughter (60) has "been a really big help in all this"; son (7) has auditory processing disorder, requires additional help w homework/school.  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  Past history of anxiety and depression prior to cancer diagnosis, postpartum depression.  Discussed need for referral to psychiatric medications provider for thorough evaluation of mental health profile in conjunction w current cancer treatment regimen.  Worked on ways to stabilize communication cycle of control/react w patient and spouse.  Explored options for patient to practice appropriate self care and stress reduction.  CSW and patient will meet weekly for 4 - 6 sessions in order to explore goals and plans for next phase of life for patient.  Patient and spouse rated usefulness of session as "8", felt discussion was helpful and will work on communication patterns and positive interactions.  Edwyna Shell, LCSW Clinical Social Worker Phone:  [phone]

## 2017-11-13 NOTE — Telephone Encounter (Signed)
Called patient to inform of mammogram and ultrasound on 11-17-17- arrival time 9:10 am @ [facility], lvm for a return call

## 2017-11-16 ENCOUNTER — Ambulatory Visit: Payer: 59 | Attending: General Surgery | Admitting: Physical Therapy

## 2017-11-16 ENCOUNTER — Encounter: Payer: Self-pay | Admitting: Physical Therapy

## 2017-11-16 DIAGNOSIS — I890 Lymphedema, not elsewhere classified: Secondary | ICD-10-CM | POA: Insufficient documentation

## 2017-11-16 DIAGNOSIS — Z483 Aftercare following surgery for neoplasm: Secondary | ICD-10-CM | POA: Diagnosis present

## 2017-11-16 DIAGNOSIS — R293 Abnormal posture: Secondary | ICD-10-CM | POA: Diagnosis present

## 2017-11-16 DIAGNOSIS — M25612 Stiffness of left shoulder, not elsewhere classified: Secondary | ICD-10-CM | POA: Diagnosis not present

## 2017-11-16 DIAGNOSIS — M25611 Stiffness of right shoulder, not elsewhere classified: Secondary | ICD-10-CM | POA: Diagnosis present

## 2017-11-16 DIAGNOSIS — M79601 Pain in right arm: Secondary | ICD-10-CM | POA: Insufficient documentation

## 2017-11-16 DIAGNOSIS — M79602 Pain in left arm: Secondary | ICD-10-CM | POA: Insufficient documentation
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
## 2017-11-17 ENCOUNTER — Ambulatory Visit
Admission: RE | Admit: 2017-11-17 | Discharge: 2017-11-17 | Disposition: A | Discharge status: Home / Self Care | Payer: 59 | Source: Ambulatory Visit | Attending: Radiation Oncology | Admitting: Radiation Oncology

## 2017-11-17 DIAGNOSIS — Z170 Estrogen receptor positive status [ER+]: Principal | ICD-10-CM

## 2017-11-17 DIAGNOSIS — C50511 Malignant neoplasm of lower-outer quadrant of right female breast: Secondary | ICD-10-CM

## 2017-11-17 HISTORY — DX: Malignant neoplasm of unspecified site of unspecified female breast: C50.919

## 2017-11-18 ENCOUNTER — Encounter: Payer: Self-pay | Admitting: Physical Therapy

## 2017-11-18 ENCOUNTER — Telehealth: Payer: Self-pay | Admitting: Radiation Oncology

## 2017-11-18 ENCOUNTER — Ambulatory Visit: Payer: 59 | Admitting: Physical Therapy

## 2017-11-18 DIAGNOSIS — M25611 Stiffness of right shoulder, not elsewhere classified: Secondary | ICD-10-CM

## 2017-11-18 DIAGNOSIS — Z483 Aftercare following surgery for neoplasm: Secondary | ICD-10-CM

## 2017-11-18 DIAGNOSIS — M25612 Stiffness of left shoulder, not elsewhere classified: Secondary | ICD-10-CM | POA: Diagnosis not present

## 2017-11-18 DIAGNOSIS — R293 Abnormal posture: Secondary | ICD-10-CM
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
## 2017-11-18 NOTE — Telephone Encounter (Signed)
I called and LM for the patient to review her recent imaging studies. According to the notes she does know the findings.

## 2017-11-20 ENCOUNTER — Encounter: Payer: Self-pay | Admitting: General Practice

## 2017-11-20 NOTE — Progress Notes (Signed)
[facility] CSW Progress Notes  Date of session:  11/20/17 Time:  3 - 4 PM  CSW met w patient in office.  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  Encouraged multi-factorial approach to anxiety management including grounding exercises, creative play activities, positive focused journaling, and finding ways to communicate boundaries to family/friends.  Will continue to work on processing issues related to cancer and recovery next week  Edwyna Shell, [place] Worker Phone:  [phone]

## 2017-11-23 ENCOUNTER — Ambulatory Visit
Admission: RE | Admit: 2017-11-23 | Discharge: 2017-11-23 | Disposition: A | Discharge status: Home / Self Care | Payer: 59 | Source: Ambulatory Visit | Attending: Neurosurgery | Admitting: Neurosurgery

## 2017-11-23 ENCOUNTER — Other Ambulatory Visit: Payer: 59

## 2017-11-23 DIAGNOSIS — M5137 Other intervertebral disc degeneration, lumbosacral region: Secondary | ICD-10-CM

## 2017-11-24 ENCOUNTER — Ambulatory Visit: Payer: 59 | Admitting: Physical Therapy

## 2017-11-24 ENCOUNTER — Encounter: Payer: Self-pay | Admitting: Physical Therapy

## 2017-11-24 DIAGNOSIS — Z483 Aftercare following surgery for neoplasm: Secondary | ICD-10-CM

## 2017-11-24 DIAGNOSIS — M25611 Stiffness of right shoulder, not elsewhere classified: Secondary | ICD-10-CM

## 2017-11-24 DIAGNOSIS — M25612 Stiffness of left shoulder, not elsewhere classified: Secondary | ICD-10-CM

## 2017-11-24 DIAGNOSIS — M79602 Pain in left arm: Secondary | ICD-10-CM

## 2017-11-24 DIAGNOSIS — R293 Abnormal posture: Secondary | ICD-10-CM

## 2017-11-24 DIAGNOSIS — M79601 Pain in right arm: Secondary | ICD-10-CM
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
## 2017-11-26 ENCOUNTER — Ambulatory Visit: Payer: 59 | Admitting: Physical Therapy

## 2017-11-26 ENCOUNTER — Encounter: Payer: Self-pay | Admitting: Physical Therapy

## 2017-11-26 DIAGNOSIS — M25611 Stiffness of right shoulder, not elsewhere classified: Secondary | ICD-10-CM

## 2017-11-26 DIAGNOSIS — R293 Abnormal posture: Secondary | ICD-10-CM

## 2017-11-26 DIAGNOSIS — M25612 Stiffness of left shoulder, not elsewhere classified: Secondary | ICD-10-CM | POA: Diagnosis not present

## 2017-11-26 DIAGNOSIS — M79602 Pain in left arm: Secondary | ICD-10-CM

## 2017-11-26 DIAGNOSIS — Z483 Aftercare following surgery for neoplasm: Secondary | ICD-10-CM

## 2017-11-26 DIAGNOSIS — M79601 Pain in right arm: Secondary | ICD-10-CM
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
## 2017-11-27 ENCOUNTER — Encounter: Payer: Self-pay | Admitting: General Practice

## 2017-11-27 NOTE — Progress Notes (Signed)
[facility] CSW Progress Notes  Date of session:  3;15;19 Time:  3 - 4 PM  CSW met w patient in office.  Continued work on adjustment to survivorship, trauma related to cancer diagnosis and treatment.  Working on positive self care, making choices related to regaining health and well being for self and family.  Used book "[employer]" w children to discuss ways to develop safe space for overwhelmed feelings in self and children.  Will continue to work on processing issues related to cancer and recovery next week, with particular work on identifying personal values and ordering priorities based on personal values.  Edwyna Shell, LCSW Clinical Social Worker Phone:  [phone]

## 2017-12-01 ENCOUNTER — Encounter: Payer: 59 | Admitting: Physical Therapy

## 2017-12-03 ENCOUNTER — Encounter: Payer: 59 | Admitting: Physical Therapy

## 2017-12-04 ENCOUNTER — Encounter: Payer: Self-pay | Admitting: General Practice

## 2017-12-04 NOTE — Progress Notes (Signed)
[facility] CSW Progress Note  Date of session: 12/04/17 Time:  3 - 4 PM  Met w patient in office. Continued work on processing survivorship issues, finding ways to integrate cancer diagnosis and treatment into current life.  Processed ways of dealing w anxiety related to current experience of pain and waiting period for upcoming scan.  Discussed anxiety as both physical and mental processing challenge.  Encouraged patient to consider various options to manage anxiety.  Encouraged to consider [facility] survivorship program Finding Your New Normal.  Edwyna Shell, [place] Worker Phone:  [phone]

## 2017-12-07 ENCOUNTER — Encounter: Payer: 59 | Admitting: Physical Therapy

## 2017-12-08 ENCOUNTER — Other Ambulatory Visit (HOSPITAL_COMMUNITY): Payer: Self-pay | Admitting: Neurosurgery

## 2017-12-08 DIAGNOSIS — Q799 Congenital malformation of musculoskeletal system, unspecified: Principal | ICD-10-CM

## 2017-12-08 DIAGNOSIS — M898X8 Other specified disorders of bone, other site: Secondary | ICD-10-CM

## 2017-12-09 ENCOUNTER — Encounter: Payer: 59 | Admitting: Physical Therapy

## 2017-12-14 ENCOUNTER — Encounter (HOSPITAL_COMMUNITY)
Admission: RE | Admit: 2017-12-14 | Discharge: 2017-12-14 | Disposition: A | Discharge status: Home / Self Care | Payer: 59 | Source: Ambulatory Visit | Attending: Neurosurgery | Admitting: Neurosurgery

## 2017-12-14 ENCOUNTER — Encounter (HOSPITAL_COMMUNITY): Payer: Self-pay

## 2017-12-14 DIAGNOSIS — Q799 Congenital malformation of musculoskeletal system, unspecified: Secondary | ICD-10-CM | POA: Diagnosis not present

## 2017-12-14 DIAGNOSIS — M898X8 Other specified disorders of bone, other site: Secondary | ICD-10-CM

## 2017-12-14 MED ORDER — TECHNETIUM TC 99M MEDRONATE IV KIT
25.0000 | PACK | Freq: Once | INTRAVENOUS | Status: AC | PRN
  Administered 2017-12-14: 23.8 via INTRAVENOUS

## 2017-12-16 ENCOUNTER — Ambulatory Visit: Payer: 59 | Attending: Neurosurgery | Admitting: Physical Therapy

## 2017-12-16 ENCOUNTER — Encounter: Payer: Self-pay | Admitting: Physical Therapy

## 2017-12-16 DIAGNOSIS — R293 Abnormal posture: Secondary | ICD-10-CM | POA: Diagnosis not present

## 2017-12-16 DIAGNOSIS — G8929 Other chronic pain: Secondary | ICD-10-CM | POA: Diagnosis present

## 2017-12-16 DIAGNOSIS — M5442 Lumbago with sciatica, left side: Secondary | ICD-10-CM | POA: Diagnosis present

## 2017-12-16 DIAGNOSIS — M6281 Muscle weakness (generalized): Secondary | ICD-10-CM
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
## 2017-12-16 NOTE — Patient Instructions (Addendum)
Access Code: [email or web address]  URL: [email or web address]  Date: 12/16/2017  Prepared by: Raeford Razor   Exercises  Supine Piriformis Stretch with Foot on Ground - 3 reps - 1 sets - 30 hold - 2x daily - 7x weekly  Seated Piriformis Stretch with Trunk Bend - 3 reps - 1 sets - 30 hold - 2x daily - 7x weekly    Pilates level 1  Clam  March a Heel slide/SLR From cabinet for anatomy

## 2017-12-17 ENCOUNTER — Encounter (HOSPITAL_COMMUNITY): Payer: Self-pay | Admitting: Psychiatry

## 2017-12-17 ENCOUNTER — Ambulatory Visit (INDEPENDENT_AMBULATORY_CARE_PROVIDER_SITE_OTHER): Payer: 59 | Admitting: Psychiatry

## 2017-12-17 VITALS — BP 151/91 | HR 56 | Ht 68.0 in | Wt 195.0 lb

## 2017-12-17 DIAGNOSIS — F411 Generalized anxiety disorder: Secondary | ICD-10-CM | POA: Diagnosis not present

## 2017-12-17 DIAGNOSIS — G4700 Insomnia, unspecified: Secondary | ICD-10-CM

## 2017-12-17 DIAGNOSIS — F330 Major depressive disorder, recurrent, mild: Secondary | ICD-10-CM

## 2017-12-17 DIAGNOSIS — Z975 Presence of (intrauterine) contraceptive device: Secondary | ICD-10-CM

## 2017-12-17 DIAGNOSIS — Z87891 Personal history of nicotine dependence: Secondary | ICD-10-CM | POA: Diagnosis not present

## 2017-12-17 DIAGNOSIS — F410 Panic disorder [episodic paroxysmal anxiety] without agoraphobia: Secondary | ICD-10-CM | POA: Diagnosis not present

## 2017-12-17 DIAGNOSIS — R450 Nervousness: Secondary | ICD-10-CM | POA: Diagnosis not present

## 2017-12-17 MED ORDER — VENLAFAXINE HCL ER 37.5 MG PO CP24: ORAL_CAPSULE | ORAL | 0 refills | Status: DC

## 2017-12-17 MED ORDER — VENLAFAXINE HCL ER 37.5 MG PO CP24: 37.5000 mg | ORAL_CAPSULE | Freq: Every day | ORAL | 2 refills | Status: DC
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
## 2017-12-18 ENCOUNTER — Encounter: Payer: Self-pay | Admitting: Physical Therapy

## 2017-12-18 ENCOUNTER — Encounter: Payer: Self-pay | Admitting: General Practice

## 2017-12-18 ENCOUNTER — Encounter: Payer: Self-pay | Admitting: Hematology and Oncology

## 2017-12-18 NOTE — Progress Notes (Signed)
[facility] CSW Progress Notes  Date of session:  12/18/17 Time:  3 - 4 PM  CSW met w patient in office, continued to work on issues related to finding recovery after cancer treatment.  Worked on identifying helpful/unhelpful self attributions and encouraging positive self care strategies.  Worked on values identification as way to align choices w values, completed values sort.  Discussed ways feelings generate automatic thoughts and actions - provided Feelings Wheel and described process for identifying feelings/thoughts/actions sequences.  Edwyna Shell, LCSW Clinical Social Worker Phone:  [phone]

## 2017-12-20 ENCOUNTER — Encounter: Payer: Self-pay | Admitting: Physical Therapy

## 2017-12-22 ENCOUNTER — Other Ambulatory Visit (HOSPITAL_COMMUNITY): Payer: Self-pay | Admitting: Psychiatry

## 2017-12-22 ENCOUNTER — Telehealth (HOSPITAL_COMMUNITY): Payer: Self-pay

## 2017-12-22 DIAGNOSIS — F330 Major depressive disorder, recurrent, mild: Secondary | ICD-10-CM

## 2017-12-22 NOTE — Telephone Encounter (Signed)
Medication management - Telephone call with patient twice to discuss her status with pharmacy only giving her #30 pills of Effexor XR 37.5 mg and pt. should be getting 3 a day. Attempted multiple times to get through to [facility] in [employer] but no answer to phone as it rang and no voicemail or answer.  Verified phone number to [facility] again with patient too and agreed to call in the AM to attempt to reiterate the correct order from 12/17/17 by Dr. Adele Schilder for 90 capsules, 3 a day and patient agreed with plan.  Agreed to call patient back once discussed with pharmacy.

## 2017-12-22 NOTE — Telephone Encounter (Signed)
She should be taking Effexor XR 37.5 mg 3 capsule daily.  We can provide 30-day supply until she can be seen on her next appointment.

## 2017-12-22 NOTE — Telephone Encounter (Signed)
Patient called and said that her Effexor XR prescription was changed and she will not have enough tablets to last the entire month. Pharmacy will need clarification. Please advise

## 2017-12-23 NOTE — Telephone Encounter (Signed)
Called [facility] and spoke with pharmacist and correct prescription is on file for Effexor 37.5 mg tabs TID #90. Prescription can be filled on 12/25/17 for insurance to pay. Called patient and explained situation to her she understood and was happy with the outcome.

## 2017-12-25 ENCOUNTER — Encounter: Payer: Self-pay | Admitting: General Practice

## 2017-12-25 NOTE — Progress Notes (Signed)
[facility] CSW Progress Note  Date of service:  12/25/17 Time:  3 - 4 PM  CSW met w patient in office, continuing to work on issues related to adjustment to illness and recreating life after cancer diagnosis/treatment. Reports she is feeling more optimism/positive mood this week.  Discussed survivorship issues including recalibration of relationships with bio family, finding ways to support children as they process feelings related to patient's illness, self care issues, acceptance of capacity of others to empathize/pay appropriate attention to her needs.  Appreciates contribution of husband, significant support for her throughout illness which has deepened relationship.  Working on ways to allow self to feel feelings rather than "get through the day" as was the coping strategy during active treatment.  Anticipating upcoming family trip.  Will return in 3 weeks when back.  Edwyna Shell, LCSW Clinical Social Worker Phone:  [phone]

## 2017-12-28 ENCOUNTER — Ambulatory Visit: Payer: 59 | Admitting: Physical Therapy

## 2017-12-28 ENCOUNTER — Encounter: Payer: Self-pay | Admitting: Physical Therapy

## 2017-12-28 DIAGNOSIS — R293 Abnormal posture: Secondary | ICD-10-CM

## 2017-12-28 DIAGNOSIS — G8929 Other chronic pain: Secondary | ICD-10-CM

## 2017-12-28 DIAGNOSIS — M5442 Lumbago with sciatica, left side: Secondary | ICD-10-CM

## 2017-12-28 DIAGNOSIS — M6281 Muscle weakness (generalized): Secondary | ICD-10-CM
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
## 2017-12-31 ENCOUNTER — Ambulatory Visit: Payer: 59 | Admitting: Physical Therapy

## 2017-12-31 ENCOUNTER — Encounter: Payer: Self-pay | Admitting: Physical Therapy

## 2017-12-31 DIAGNOSIS — G8929 Other chronic pain: Secondary | ICD-10-CM

## 2017-12-31 DIAGNOSIS — M6281 Muscle weakness (generalized): Secondary | ICD-10-CM

## 2017-12-31 DIAGNOSIS — M5442 Lumbago with sciatica, left side: Secondary | ICD-10-CM

## 2017-12-31 DIAGNOSIS — R293 Abnormal posture: Secondary | ICD-10-CM
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
## 2017-12-31 NOTE — Patient Instructions (Signed)
Remove tape as able

## 2018-01-11 ENCOUNTER — Ambulatory Visit: Payer: 59 | Admitting: Physical Therapy

## 2018-01-11 ENCOUNTER — Encounter: Payer: Self-pay | Admitting: Physical Therapy

## 2018-01-11 DIAGNOSIS — R293 Abnormal posture: Secondary | ICD-10-CM | POA: Diagnosis not present

## 2018-01-11 DIAGNOSIS — G8929 Other chronic pain: Secondary | ICD-10-CM

## 2018-01-11 DIAGNOSIS — M5442 Lumbago with sciatica, left side: Secondary | ICD-10-CM

## 2018-01-11 DIAGNOSIS — M6281 Muscle weakness (generalized): Secondary | ICD-10-CM

## 2018-01-11 NOTE — Patient Instructions (Signed)
Video of taping technique with patient's phone.
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
## 2018-01-12 ENCOUNTER — Other Ambulatory Visit: Payer: Self-pay

## 2018-01-12 ENCOUNTER — Telehealth: Payer: Self-pay

## 2018-01-12 DIAGNOSIS — C50412 Malignant neoplasm of upper-outer quadrant of left female breast: Secondary | ICD-10-CM

## 2018-01-12 DIAGNOSIS — Z170 Estrogen receptor positive status [ER+]: Principal | ICD-10-CM

## 2018-01-12 NOTE — Telephone Encounter (Signed)
Returned pt call and LVM regarding placing a referral for lymphedema clinic.  Cyndia Bent RN

## 2018-01-13 ENCOUNTER — Encounter: Payer: Self-pay | Admitting: Physical Therapy

## 2018-01-13 ENCOUNTER — Ambulatory Visit: Payer: 59 | Attending: Neurosurgery | Admitting: Physical Therapy

## 2018-01-13 DIAGNOSIS — Z483 Aftercare following surgery for neoplasm: Secondary | ICD-10-CM | POA: Diagnosis present

## 2018-01-13 DIAGNOSIS — M79602 Pain in left arm: Secondary | ICD-10-CM | POA: Diagnosis present

## 2018-01-13 DIAGNOSIS — M6281 Muscle weakness (generalized): Secondary | ICD-10-CM

## 2018-01-13 DIAGNOSIS — M5442 Lumbago with sciatica, left side: Secondary | ICD-10-CM | POA: Insufficient documentation

## 2018-01-13 DIAGNOSIS — R293 Abnormal posture: Secondary | ICD-10-CM | POA: Insufficient documentation

## 2018-01-13 DIAGNOSIS — I890 Lymphedema, not elsewhere classified: Secondary | ICD-10-CM | POA: Insufficient documentation

## 2018-01-13 DIAGNOSIS — G8929 Other chronic pain: Secondary | ICD-10-CM | POA: Diagnosis present
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
## 2018-01-18 ENCOUNTER — Ambulatory Visit: Payer: 59 | Admitting: Physical Therapy

## 2018-01-18 ENCOUNTER — Encounter

## 2018-01-18 ENCOUNTER — Ambulatory Visit (HOSPITAL_COMMUNITY): Payer: 59 | Admitting: Psychiatry

## 2018-01-18 DIAGNOSIS — M5442 Lumbago with sciatica, left side: Secondary | ICD-10-CM

## 2018-01-18 DIAGNOSIS — G8929 Other chronic pain: Secondary | ICD-10-CM

## 2018-01-18 DIAGNOSIS — R293 Abnormal posture: Secondary | ICD-10-CM

## 2018-01-18 DIAGNOSIS — M6281 Muscle weakness (generalized): Secondary | ICD-10-CM
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
## 2018-01-19 ENCOUNTER — Encounter: Payer: Self-pay | Admitting: Physical Therapy

## 2018-01-19 ENCOUNTER — Ambulatory Visit: Payer: 59 | Admitting: Physical Therapy

## 2018-01-19 DIAGNOSIS — R293 Abnormal posture: Secondary | ICD-10-CM | POA: Diagnosis not present

## 2018-01-19 DIAGNOSIS — M79602 Pain in left arm: Secondary | ICD-10-CM

## 2018-01-19 DIAGNOSIS — I890 Lymphedema, not elsewhere classified: Secondary | ICD-10-CM
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
## 2018-01-20 ENCOUNTER — Encounter: Payer: Self-pay | Admitting: Physical Therapy

## 2018-01-20 ENCOUNTER — Ambulatory Visit: Payer: 59 | Admitting: Physical Therapy

## 2018-01-20 DIAGNOSIS — R293 Abnormal posture: Secondary | ICD-10-CM

## 2018-01-20 DIAGNOSIS — M6281 Muscle weakness (generalized): Secondary | ICD-10-CM

## 2018-01-20 DIAGNOSIS — M5442 Lumbago with sciatica, left side: Secondary | ICD-10-CM

## 2018-01-20 DIAGNOSIS — G8929 Other chronic pain: Secondary | ICD-10-CM
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
## 2018-01-21 ENCOUNTER — Ambulatory Visit (INDEPENDENT_AMBULATORY_CARE_PROVIDER_SITE_OTHER): Payer: 59 | Admitting: Psychiatry

## 2018-01-21 DIAGNOSIS — F411 Generalized anxiety disorder: Secondary | ICD-10-CM

## 2018-01-21 DIAGNOSIS — F330 Major depressive disorder, recurrent, mild: Secondary | ICD-10-CM

## 2018-01-21 DIAGNOSIS — Z87891 Personal history of nicotine dependence: Secondary | ICD-10-CM | POA: Diagnosis not present

## 2018-01-21 DIAGNOSIS — F410 Panic disorder [episodic paroxysmal anxiety] without agoraphobia: Secondary | ICD-10-CM | POA: Diagnosis not present

## 2018-01-21 MED ORDER — VENLAFAXINE HCL ER 37.5 MG PO CP24: ORAL_CAPSULE | ORAL | 2 refills | Status: DC
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
## 2018-01-26 ENCOUNTER — Encounter: Payer: Self-pay | Admitting: Physical Therapy

## 2018-01-26 ENCOUNTER — Ambulatory Visit: Payer: 59 | Admitting: Physical Therapy

## 2018-01-26 DIAGNOSIS — M5442 Lumbago with sciatica, left side: Secondary | ICD-10-CM

## 2018-01-26 DIAGNOSIS — M6281 Muscle weakness (generalized): Secondary | ICD-10-CM

## 2018-01-26 DIAGNOSIS — R293 Abnormal posture: Secondary | ICD-10-CM

## 2018-01-26 DIAGNOSIS — G8929 Other chronic pain: Secondary | ICD-10-CM
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
## 2018-01-27 ENCOUNTER — Inpatient Hospital Stay: Payer: 59 | Attending: Adult Health | Admitting: Adult Health

## 2018-01-27 ENCOUNTER — Telehealth: Payer: Self-pay | Admitting: Adult Health

## 2018-01-27 ENCOUNTER — Encounter: Payer: Self-pay | Admitting: Adult Health

## 2018-01-27 ENCOUNTER — Ambulatory Visit: Payer: 59

## 2018-01-27 VITALS — BP 126/90 | HR 91 | Temp 98.5°F | Resp 18 | Ht 68.0 in | Wt 196.9 lb

## 2018-01-27 DIAGNOSIS — R293 Abnormal posture: Secondary | ICD-10-CM | POA: Diagnosis not present

## 2018-01-27 DIAGNOSIS — R5383 Other fatigue: Secondary | ICD-10-CM | POA: Diagnosis not present

## 2018-01-27 DIAGNOSIS — C50412 Malignant neoplasm of upper-outer quadrant of left female breast: Secondary | ICD-10-CM

## 2018-01-27 DIAGNOSIS — Z79810 Long term (current) use of selective estrogen receptor modulators (SERMs): Secondary | ICD-10-CM | POA: Insufficient documentation

## 2018-01-27 DIAGNOSIS — M79602 Pain in left arm: Secondary | ICD-10-CM

## 2018-01-27 DIAGNOSIS — Z923 Personal history of irradiation: Secondary | ICD-10-CM | POA: Diagnosis not present

## 2018-01-27 DIAGNOSIS — I890 Lymphedema, not elsewhere classified: Secondary | ICD-10-CM

## 2018-01-27 DIAGNOSIS — Z170 Estrogen receptor positive status [ER+]: Secondary | ICD-10-CM

## 2018-01-27 DIAGNOSIS — C50511 Malignant neoplasm of lower-outer quadrant of right female breast: Secondary | ICD-10-CM
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
## 2018-01-27 NOTE — Telephone Encounter (Signed)
Patient had to leave for work and asked to schedule her labs on Friday. Provider aware. Patient declined avs and calendar of upcoming appointments.
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
## 2018-01-28 ENCOUNTER — Encounter: Payer: Self-pay | Admitting: Physical Therapy

## 2018-01-28 ENCOUNTER — Ambulatory Visit: Payer: 59 | Admitting: Physical Therapy

## 2018-01-28 DIAGNOSIS — I890 Lymphedema, not elsewhere classified: Secondary | ICD-10-CM

## 2018-01-28 DIAGNOSIS — R293 Abnormal posture: Secondary | ICD-10-CM | POA: Diagnosis not present

## 2018-01-28 DIAGNOSIS — M79602 Pain in left arm: Secondary | ICD-10-CM
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
## 2018-01-29 ENCOUNTER — Inpatient Hospital Stay: Payer: 59

## 2018-01-29 ENCOUNTER — Encounter: Payer: Self-pay | Admitting: Physical Therapy

## 2018-01-29 ENCOUNTER — Encounter: Payer: Self-pay | Admitting: General Practice

## 2018-01-29 ENCOUNTER — Other Ambulatory Visit: Payer: Self-pay | Admitting: *Deleted

## 2018-01-29 ENCOUNTER — Ambulatory Visit: Payer: 59 | Admitting: Physical Therapy

## 2018-01-29 DIAGNOSIS — C50412 Malignant neoplasm of upper-outer quadrant of left female breast: Secondary | ICD-10-CM | POA: Diagnosis not present

## 2018-01-29 DIAGNOSIS — R293 Abnormal posture: Secondary | ICD-10-CM | POA: Diagnosis not present

## 2018-01-29 DIAGNOSIS — M5442 Lumbago with sciatica, left side: Secondary | ICD-10-CM

## 2018-01-29 DIAGNOSIS — M6281 Muscle weakness (generalized): Secondary | ICD-10-CM

## 2018-01-29 DIAGNOSIS — G8929 Other chronic pain: Secondary | ICD-10-CM

## 2018-01-29 DIAGNOSIS — R5383 Other fatigue: Secondary | ICD-10-CM

## 2018-01-29 DIAGNOSIS — Z170 Estrogen receptor positive status [ER+]: Principal | ICD-10-CM

## 2018-01-29 LAB — CBC WITH DIFFERENTIAL (CANCER CENTER ONLY)
BASOS ABS: 0 10*3/uL (ref 0.0–0.1)
Basophils Relative: 1 %
Eosinophils Absolute: 0.1 10*3/uL (ref 0.0–0.5)
Eosinophils Relative: 1 %
HEMATOCRIT: 41.6 % (ref 34.8–46.6)
Hemoglobin: 13.9 g/dL (ref 11.6–15.9)
LYMPHS ABS: 1.5 10*3/uL (ref 0.9–3.3)
LYMPHS PCT: 26 %
MCH: 31.1 pg (ref 25.1–34.0)
MCHC: 33.3 g/dL (ref 31.5–36.0)
MCV: 93.4 fL (ref 79.5–101.0)
Monocytes Absolute: 0.6 10*3/uL (ref 0.1–0.9)
Monocytes Relative: 10 %
NEUTROS ABS: 3.7 10*3/uL (ref 1.5–6.5)
Neutrophils Relative %: 62 %
Platelet Count: 208 10*3/uL (ref 145–400)
RBC: 4.45 MIL/uL (ref 3.70–5.45)
RDW: 13.1 % (ref 11.2–14.5)
WBC: 5.9 10*3/uL (ref 3.9–10.3)

## 2018-01-29 LAB — CMP (CANCER CENTER ONLY)
ALBUMIN: 3.6 g/dL (ref 3.5–5.0)
ALT: 13 U/L (ref 0–55)
ANION GAP: 6 (ref 3–11)
AST: 13 U/L (ref 5–34)
Alkaline Phosphatase: 46 U/L (ref 40–150)
BUN: 21 mg/dL (ref 7–26)
CHLORIDE: 110 mmol/L — AB (ref 98–109)
CO2: 24 mmol/L (ref 22–29)
Calcium: 9 mg/dL (ref 8.4–10.4)
Creatinine: 0.85 mg/dL (ref 0.60–1.10)
GFR, Est AFR Am: >60 mL/min (ref >60)
GLUCOSE: 93 mg/dL (ref 70–140)
POTASSIUM: 4.6 mmol/L (ref 3.5–5.1)
Sodium: 140 mmol/L (ref 136–145)
TOTAL PROTEIN: 7.1 g/dL (ref 6.4–8.3)
Total Bilirubin: 0.2 mg/dL (ref 0.2–1.2)

## 2018-01-29 LAB — TSH: TSH: 0.657 u[IU]/mL (ref 0.308–3.960)

## 2018-01-29 MED ORDER — ZOLPIDEM TARTRATE 10 MG PO TABS: 10.0000 mg | ORAL_TABLET | Freq: Every evening | ORAL | 3 refills | Status: DC | PRN

## 2018-01-29 NOTE — Telephone Encounter (Signed)
Last OV 07/23/17, last filled 03/06/17 #30 tabs with 3 refills

## 2018-01-29 NOTE — Progress Notes (Signed)
[place] CSW Progress Notes  Date of Service:  01/29/18 Time:  3 - 4 PM  Met w patient in [place] office.  Continued processing of survivorship issues, recent survivorship visit was reminder of past history of cancer diagnosis and treatment.  Working on finding ways to move beyond cancer, finding balance in life, working on identifying most important activities/relationships.  Encouraged to make healthy choices about work, activities, self care.  Will return in two weeks.  Edwyna Shell, LCSW Clinical Social Worker Phone:  [phone]
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
## 2018-01-30 LAB — VITAMIN D 25 HYDROXY (VIT D DEFICIENCY, FRACTURES): VIT D 25 HYDROXY: 29.7 ng/mL — AB (ref 30.0–100.0)

## 2018-02-01 ENCOUNTER — Ambulatory Visit: Payer: 59 | Admitting: Physical Therapy

## 2018-02-02 ENCOUNTER — Encounter: Payer: Self-pay | Admitting: Physical Therapy

## 2018-02-02 ENCOUNTER — Ambulatory Visit: Payer: 59 | Admitting: Physical Therapy

## 2018-02-02 ENCOUNTER — Ambulatory Visit: Payer: 59 | Admitting: Rehabilitation

## 2018-02-02 ENCOUNTER — Telehealth: Payer: Self-pay

## 2018-02-02 DIAGNOSIS — R293 Abnormal posture: Secondary | ICD-10-CM | POA: Diagnosis not present

## 2018-02-02 DIAGNOSIS — M5442 Lumbago with sciatica, left side: Secondary | ICD-10-CM

## 2018-02-02 DIAGNOSIS — M6281 Muscle weakness (generalized): Secondary | ICD-10-CM

## 2018-02-02 DIAGNOSIS — G8929 Other chronic pain: Secondary | ICD-10-CM

## 2018-02-02 NOTE — Telephone Encounter (Signed)
Spoke with patient to inform of lab result slightly low vitamin d level.  Per NP, take vitamin D3 3000 IU and repeat lab in 3 months.  Patient voiced understanding.  No questions/concerns at this time.

## 2018-02-02 NOTE — Telephone Encounter (Signed)
-----   Message from Gardenia Phlegm, NP sent at 02/01/2018  5:14 PM EDT ----- Patient vitamin d is slighlty low.  I would recommend she take vitamin d3, 3000 IU daily if she is not already taking supplementation.  Would recommend repeating a vitamin d level in 3 months.  ----- Message ----- From: Interface, Lab In [place] Sent: 01/29/2018   2:54 PM To: Gardenia Phlegm, NP
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
## 2018-02-05 ENCOUNTER — Encounter: Payer: Self-pay | Admitting: Rehabilitation

## 2018-02-05 ENCOUNTER — Ambulatory Visit: Payer: 59 | Admitting: Physical Therapy

## 2018-02-05 ENCOUNTER — Ambulatory Visit: Payer: 59 | Admitting: Rehabilitation

## 2018-02-05 DIAGNOSIS — I890 Lymphedema, not elsewhere classified: Secondary | ICD-10-CM

## 2018-02-05 DIAGNOSIS — G8929 Other chronic pain: Secondary | ICD-10-CM

## 2018-02-05 DIAGNOSIS — R293 Abnormal posture: Secondary | ICD-10-CM

## 2018-02-05 DIAGNOSIS — M6281 Muscle weakness (generalized): Secondary | ICD-10-CM

## 2018-02-05 DIAGNOSIS — M5442 Lumbago with sciatica, left side: Secondary | ICD-10-CM

## 2018-02-05 DIAGNOSIS — Z483 Aftercare following surgery for neoplasm: Secondary | ICD-10-CM

## 2018-02-05 NOTE — Patient Instructions (Signed)
To wear tribute nightly for a week or two and then use it as needed Continue self MLD and exercise
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
## 2018-02-09 ENCOUNTER — Encounter: Payer: Self-pay | Admitting: Hematology and Oncology

## 2018-02-10 ENCOUNTER — Encounter: Payer: Self-pay | Admitting: Physical Therapy

## 2018-02-10 ENCOUNTER — Ambulatory Visit: Payer: 59 | Admitting: Physical Therapy

## 2018-02-10 DIAGNOSIS — G8929 Other chronic pain: Secondary | ICD-10-CM

## 2018-02-10 DIAGNOSIS — M6281 Muscle weakness (generalized): Secondary | ICD-10-CM

## 2018-02-10 DIAGNOSIS — R293 Abnormal posture: Secondary | ICD-10-CM | POA: Diagnosis not present

## 2018-02-10 DIAGNOSIS — M5442 Lumbago with sciatica, left side: Secondary | ICD-10-CM
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
## 2018-02-12 ENCOUNTER — Ambulatory Visit: Payer: 59 | Admitting: Physical Therapy

## 2018-02-12 DIAGNOSIS — M5442 Lumbago with sciatica, left side: Secondary | ICD-10-CM

## 2018-02-12 DIAGNOSIS — G8929 Other chronic pain: Secondary | ICD-10-CM

## 2018-02-12 DIAGNOSIS — M6281 Muscle weakness (generalized): Secondary | ICD-10-CM

## 2018-02-12 DIAGNOSIS — R293 Abnormal posture: Secondary | ICD-10-CM | POA: Diagnosis not present
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
## 2018-02-19 ENCOUNTER — Encounter

## 2018-02-23 ENCOUNTER — Ambulatory Visit: Payer: 59 | Admitting: Physical Therapy

## 2018-02-26 ENCOUNTER — Ambulatory Visit: Payer: 59 | Admitting: Physical Therapy

## 2018-03-02 ENCOUNTER — Encounter: Payer: Self-pay | Admitting: Physical Therapy

## 2018-03-02 ENCOUNTER — Ambulatory Visit: Payer: 59 | Attending: Neurosurgery | Admitting: Physical Therapy

## 2018-03-02 DIAGNOSIS — M5442 Lumbago with sciatica, left side: Secondary | ICD-10-CM | POA: Insufficient documentation

## 2018-03-02 DIAGNOSIS — R293 Abnormal posture: Secondary | ICD-10-CM | POA: Diagnosis not present

## 2018-03-02 DIAGNOSIS — M6281 Muscle weakness (generalized): Secondary | ICD-10-CM | POA: Insufficient documentation

## 2018-03-02 DIAGNOSIS — G8929 Other chronic pain: Secondary | ICD-10-CM
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
## 2018-03-04 ENCOUNTER — Encounter: Payer: Self-pay | Admitting: Physical Therapy

## 2018-03-04 ENCOUNTER — Ambulatory Visit: Payer: 59 | Admitting: Physical Therapy

## 2018-03-04 DIAGNOSIS — M5442 Lumbago with sciatica, left side: Secondary | ICD-10-CM

## 2018-03-04 DIAGNOSIS — R293 Abnormal posture: Secondary | ICD-10-CM

## 2018-03-04 DIAGNOSIS — M6281 Muscle weakness (generalized): Secondary | ICD-10-CM

## 2018-03-04 DIAGNOSIS — G8929 Other chronic pain: Secondary | ICD-10-CM
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
## 2018-03-08 ENCOUNTER — Encounter: Payer: Self-pay | Admitting: Physical Therapy

## 2018-03-08 ENCOUNTER — Ambulatory Visit: Payer: 59 | Admitting: Physical Therapy

## 2018-03-08 DIAGNOSIS — R293 Abnormal posture: Secondary | ICD-10-CM

## 2018-03-08 DIAGNOSIS — G8929 Other chronic pain: Secondary | ICD-10-CM

## 2018-03-08 DIAGNOSIS — M6281 Muscle weakness (generalized): Secondary | ICD-10-CM

## 2018-03-08 DIAGNOSIS — M5442 Lumbago with sciatica, left side: Secondary | ICD-10-CM
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
## 2018-03-08 NOTE — Patient Instructions (Signed)
Try hanging on edge of pool in deep water

## 2018-03-09 ENCOUNTER — Ambulatory Visit: Payer: 59 | Admitting: Physical Therapy

## 2018-03-09 ENCOUNTER — Encounter: Payer: Self-pay | Admitting: Physical Therapy

## 2018-03-09 DIAGNOSIS — R293 Abnormal posture: Secondary | ICD-10-CM

## 2018-03-09 DIAGNOSIS — M6281 Muscle weakness (generalized): Secondary | ICD-10-CM

## 2018-03-09 DIAGNOSIS — M5442 Lumbago with sciatica, left side: Secondary | ICD-10-CM

## 2018-03-09 DIAGNOSIS — G8929 Other chronic pain: Secondary | ICD-10-CM
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
## 2018-03-10 ENCOUNTER — Encounter: Payer: Self-pay | Admitting: Physical Therapy

## 2018-03-16 ENCOUNTER — Ambulatory Visit: Payer: 59 | Admitting: Physical Therapy

## 2018-03-22 ENCOUNTER — Ambulatory Visit
Admission: RE | Admit: 2018-03-22 | Discharge: 2018-03-22 | Disposition: A | Discharge status: Home / Self Care | Payer: 59 | Source: Ambulatory Visit | Attending: Adult Health | Admitting: Adult Health

## 2018-03-22 ENCOUNTER — Encounter: Payer: Self-pay | Admitting: Physical Therapy

## 2018-03-22 ENCOUNTER — Ambulatory Visit: Payer: 59 | Attending: Neurosurgery | Admitting: Physical Therapy

## 2018-03-22 DIAGNOSIS — R293 Abnormal posture: Secondary | ICD-10-CM | POA: Diagnosis not present

## 2018-03-22 DIAGNOSIS — M6281 Muscle weakness (generalized): Secondary | ICD-10-CM | POA: Diagnosis present

## 2018-03-22 DIAGNOSIS — M5442 Lumbago with sciatica, left side: Secondary | ICD-10-CM | POA: Insufficient documentation

## 2018-03-22 DIAGNOSIS — Z170 Estrogen receptor positive status [ER+]: Principal | ICD-10-CM

## 2018-03-22 DIAGNOSIS — C50412 Malignant neoplasm of upper-outer quadrant of left female breast: Secondary | ICD-10-CM

## 2018-03-22 DIAGNOSIS — G8929 Other chronic pain: Secondary | ICD-10-CM

## 2018-03-22 DIAGNOSIS — C50511 Malignant neoplasm of lower-outer quadrant of right female breast: Secondary | ICD-10-CM

## 2018-03-22 HISTORY — DX: Personal history of irradiation: Z92.3
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
## 2018-03-23 ENCOUNTER — Encounter: Payer: Self-pay | Admitting: Adult Health

## 2018-03-23 ENCOUNTER — Ambulatory Visit: Payer: 59 | Admitting: Physical Therapy

## 2018-04-02 ENCOUNTER — Other Ambulatory Visit: Payer: Self-pay | Admitting: *Deleted

## 2018-04-07 ENCOUNTER — Other Ambulatory Visit (HOSPITAL_COMMUNITY): Payer: Self-pay | Admitting: Psychiatry

## 2018-04-07 DIAGNOSIS — F330 Major depressive disorder, recurrent, mild: Secondary | ICD-10-CM

## 2018-04-08 ENCOUNTER — Telehealth: Payer: Self-pay | Admitting: Hematology and Oncology

## 2018-04-08 NOTE — Telephone Encounter (Signed)
Per 7/23 sch msg.  Spoke with patient and scheduled nutrition appt for 8/5.

## 2018-04-10 ENCOUNTER — Encounter (HOSPITAL_COMMUNITY): Payer: Self-pay

## 2018-04-10 ENCOUNTER — Ambulatory Visit (INDEPENDENT_AMBULATORY_CARE_PROVIDER_SITE_OTHER): Payer: Worker's Compensation

## 2018-04-10 ENCOUNTER — Ambulatory Visit (HOSPITAL_COMMUNITY)
Admission: EM | Admit: 2018-04-10 | Discharge: 2018-04-10 | Disposition: A | Discharge status: Home / Self Care | Payer: Worker's Compensation | Attending: Internal Medicine | Admitting: Internal Medicine

## 2018-04-10 DIAGNOSIS — M79631 Pain in right forearm: Secondary | ICD-10-CM | POA: Diagnosis not present

## 2018-04-10 DIAGNOSIS — S5011XA Contusion of right forearm, initial encounter: Secondary | ICD-10-CM | POA: Diagnosis not present

## 2018-04-10 DIAGNOSIS — M25531 Pain in right wrist: Secondary | ICD-10-CM | POA: Diagnosis not present

## 2018-04-10 MED ORDER — KETOROLAC TROMETHAMINE 60 MG/2ML IM SOLN
INTRAMUSCULAR | Status: AC
  Filled 2018-04-10: qty 2

## 2018-04-10 MED ORDER — KETOROLAC TROMETHAMINE 60 MG/2ML IM SOLN
60.0000 mg | Freq: Once | INTRAMUSCULAR | Status: AC
  Administered 2018-04-10: 60 mg via INTRAMUSCULAR
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
## 2018-04-10 NOTE — Discharge Instructions (Addendum)
Your xray came back negative for fracture. Please take ibuprofen for pain relief. Please follow up with your family doctor if you do not improve.
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
## 2018-04-13 ENCOUNTER — Other Ambulatory Visit (HOSPITAL_COMMUNITY): Payer: Self-pay

## 2018-04-13 DIAGNOSIS — F330 Major depressive disorder, recurrent, mild: Secondary | ICD-10-CM

## 2018-04-13 MED ORDER — VENLAFAXINE HCL ER 37.5 MG PO CP24: ORAL_CAPSULE | ORAL | 2 refills | Status: DC

## 2018-04-19 ENCOUNTER — Inpatient Hospital Stay: Payer: 59 | Attending: Hematology and Oncology | Admitting: Nutrition

## 2018-04-19 DIAGNOSIS — Z170 Estrogen receptor positive status [ER+]: Secondary | ICD-10-CM | POA: Insufficient documentation

## 2018-04-19 DIAGNOSIS — Z923 Personal history of irradiation: Secondary | ICD-10-CM | POA: Insufficient documentation

## 2018-04-19 DIAGNOSIS — E559 Vitamin D deficiency, unspecified: Secondary | ICD-10-CM | POA: Insufficient documentation

## 2018-04-19 DIAGNOSIS — Z79810 Long term (current) use of selective estrogen receptor modulators (SERMs): Secondary | ICD-10-CM | POA: Insufficient documentation

## 2018-04-19 DIAGNOSIS — Z79899 Other long term (current) drug therapy: Secondary | ICD-10-CM | POA: Insufficient documentation

## 2018-04-19 DIAGNOSIS — C50412 Malignant neoplasm of upper-outer quadrant of left female breast: Secondary | ICD-10-CM | POA: Insufficient documentation

## 2018-04-19 NOTE — Progress Notes (Signed)
42 year old female status post treatment for stage 1 A  breast cancer.  Past medical history includes anxiety, depression and migraines.  Medications include Xanax, vitamin C, Effexor XR, and tamoxifen.  Labs include vitamin D 29.7.  Height: 68 inches. Weight: 196.9 pounds. Usual body weight: 200 pounds BMI: 29.94.  Patient is seeking information on appropriate diet status post breast cancer treatment.  Nutrition diagnosis:  Food and nutrition related knowledge deficit related to brest cancer and associated treatments as evidenced by no prior need for nutrition related information.  Intervention: I educated patient on the importance of increasing plant-based foods to include fruits, vegetables, and whole grains. Recommended low-fat diet. Encourage lean proteins. Encouraged increased activity of approximately 150 minutes/week. Discussed soy, sugar, vitamins and minerals, healthy food choices. Questions were answered.  Teach back method used.  Contact information given.  Resources provided.  Monitoring, evaluation, goals: Patient will tolerate healthy plant-based diet to reduce risk for breast cancer recurrence.  Nutrition diagnosis resolved.  No follow-up required.  **Disclaimer: This note was dictated with voice recognition software. Similar sounding words can inadvertently be transcribed and this note may contain transcription errors which may not have been corrected upon publication of note.**

## 2018-04-23 ENCOUNTER — Ambulatory Visit (HOSPITAL_COMMUNITY): Payer: Self-pay | Admitting: Psychiatry

## 2018-04-28 ENCOUNTER — Inpatient Hospital Stay (HOSPITAL_BASED_OUTPATIENT_CLINIC_OR_DEPARTMENT_OTHER): Payer: 59 | Admitting: Hematology and Oncology

## 2018-04-28 VITALS — BP 125/89 | HR 87 | Temp 98.7°F | Resp 20 | Ht 68.0 in | Wt 200.4 lb

## 2018-04-28 DIAGNOSIS — Z170 Estrogen receptor positive status [ER+]: Secondary | ICD-10-CM | POA: Diagnosis not present

## 2018-04-28 DIAGNOSIS — E559 Vitamin D deficiency, unspecified: Secondary | ICD-10-CM | POA: Diagnosis not present

## 2018-04-28 DIAGNOSIS — R5383 Other fatigue: Secondary | ICD-10-CM

## 2018-04-28 DIAGNOSIS — Z79899 Other long term (current) drug therapy: Secondary | ICD-10-CM | POA: Diagnosis not present

## 2018-04-28 DIAGNOSIS — Z79810 Long term (current) use of selective estrogen receptor modulators (SERMs): Secondary | ICD-10-CM

## 2018-04-28 DIAGNOSIS — Z1231 Encounter for screening mammogram for malignant neoplasm of breast: Secondary | ICD-10-CM

## 2018-04-28 DIAGNOSIS — F5101 Primary insomnia: Secondary | ICD-10-CM

## 2018-04-28 DIAGNOSIS — Z923 Personal history of irradiation: Secondary | ICD-10-CM

## 2018-04-28 DIAGNOSIS — C50412 Malignant neoplasm of upper-outer quadrant of left female breast: Secondary | ICD-10-CM | POA: Diagnosis present

## 2018-04-28 MED ORDER — TAMOXIFEN CITRATE 20 MG PO TABS: 20.0000 mg | ORAL_TABLET | Freq: Every day | ORAL | 3 refills | Status: DC

## 2018-04-28 NOTE — Assessment & Plan Note (Signed)
06/19/2017:Left lumpectomy: IDC with DCIS, 2.1 cm, 0/9 lymph nodes negative margins negative; ER 95%, PR 95%, HER-2 negative ratio 1.32, Ki-67 20%, T2N0 stage Ib;  Right lumpectomy: IDC with DCIS 1.8 cm, focally involving lateral and anterior margins, 0/3 lymph nodes negative,reexcision anterior and lateral margins DCIS,ER 100%, PR 20%, HER-2 negative ratio 1.23, Ki-67 3%, T1CN0 stage I a  Oncotype DX recurrence score 17: Risk of recurrence 11% with tamoxifen alone There was not enough material to send Oncotype on the right breast. Adjuvant radiation therapy 08/13/2017-09/29/2017  Current treatment: Tamoxifen 20 mg daily Tamoxifen toxicities:  Breast cancer surveillance: 1.  Breast exam 04/28/2018: Benign 2. mammograms 03/22/2018: Benign breast density category C  Return to clinic in 1 year for follow-up

## 2018-04-28 NOTE — Progress Notes (Signed)
Patient Care Team: Tower, Wynelle Fanny, MD as PCP - General (Family Medicine) Rolm Bookbinder, MD as Consulting Physician (General Surgery) Nicholas Lose, MD as Consulting Physician (Hematology and Oncology) Kyung Rudd, MD as Consulting Physician (Radiation Oncology) Delice Bison Charlestine Massed, NP as Nurse Practitioner (Hematology and Oncology)  DIAGNOSIS:  Encounter Diagnosis  Name Primary?  . Malignant neoplasm of upper-outer quadrant of left breast in female, estrogen receptor positive ([place])     SUMMARY OF ONCOLOGIC HISTORY:   Malignant neoplasm of upper-outer quadrant of left breast in female, estrogen receptor positive ([place])   05/13/2017 Initial Diagnosis    Palpable left breast mass with distortion at 1:30 position: 1.1 cm with left axillary lymph node, 3.5 mm cortex, biopsy tubular 05/21/2017. Biopsy of the breast mass revealed grade 1-2 IDC with DCIS ER 95%, PR 95%, Ki-67 20%, HER-2 negative ratio 1.32 T1c N0 stage IA    05/26/2017 Genetic Testing    Negative genetic testing on the 9-gene STAT panel.The STAT Breast cancer panel offered by [facility] includes sequencing and rearrangement analysis for the following 9 genes:  ATM, BRCA1, BRCA2, CDH1, CHEK2, PALB2, PTEN, STK11 and TP53.   The report date is 05/26/2017.  Negative genetic testing on the reflexed common hereditary cancer panel.  The Hereditary Gene Panel offered by [facility] includes sequencing and/or deletion duplication testing of the following 46 genes: APC, ATM, AXIN2, BARD1, BMPR1A, BRCA1, BRCA2, BRIP1, CDH1, CDKN2A (p14ARF), CDKN2A (p16INK4a), CHEK2, CTNNA1, DICER1, EPCAM (Deletion/duplication testing only), GREM1 (promoter region deletion/duplication testing only), KIT, MEN1, MLH1, MSH2, MSH3, MSH6, MUTYH, NBN, NF1, NHTL1, PALB2, PDGFRA, PMS2, POLD1, POLE, PTEN, RAD50, RAD51C, RAD51D, SDHB, SDHC, SDHD, SMAD4, SMARCA4. STK11, TP53, TSC1, TSC2, and VHL.  The following genes were evaluated for sequence changes only: SDHA and HOXB13  c.251G>A variant only.  The report date is May 26, 2017.    06/19/2017 Surgery    Left lumpectomy: IDC with DCIS, 2.1 cm, 0/9 lymph nodes negative margins negative; ER 95%, PR 95%, HER-2 negative ratio 1.32, Ki-67 20%, T2N0 stage Ib; Right lumpectomy: IDC with DCIS 1.8 cm, focally involving lateral and anterior margins, 0/3 lymph nodes negative, ER 100%, PR 20%, HER-2 negative ratio 1.23, Ki-67 3%, T1CN0 stage I a    06/19/2017 Oncotype testing    Oncotype DX score 17 and 14: Risk of recurrence 11%/9% with tamoxifen alone    07/07/2017 Surgery    Reexcision lateral margin: Residual DCIS intermediate grade, reexcision anterior margin: [facility]    08/13/2017 - 09/29/2017 Radiation Therapy    Adjuvant radiation therapy    10/2017 -  Anti-estrogen oral therapy    Tamoxifen daily    01/20/2018 Cancer Staging    Staging form: Breast, AJCC 8th Edition - Pathologic: Stage IA (pT2, pN0, cM0, G1, ER+, PR+, HER2-) - Signed by Gardenia Phlegm, NP on 01/20/2018     Malignant neoplasm of lower-outer quadrant of right breast of female, estrogen receptor positive ([place])   07/24/2017 Initial Diagnosis    Malignant neoplasm of lower-outer quadrant of right breast of female, estrogen receptor positive ([place])    01/20/2018 Cancer Staging    Staging form: Breast, AJCC 8th Edition - Pathologic: Stage IA (pT1c, pN0, cM0, G1, ER+, PR+, HER2-) - Signed by Gardenia Phlegm, NP on 01/20/2018     CHIEF COMPLIANT: Follow-up on tamoxifen therapy  INTERVAL HISTORY: Margaret Rojas is a 57-year with above-mentioned history of left breast cancer and right breast cancers who is currently on adjuvant antiestrogen therapy with tamoxifen.  She  has had many problems related to tamoxifen including thinning of hair, fogginess of the mind, hot flashes, difficulty with sleeping, fatigue.  She wonders how many of the symptoms are related to tamoxifen.  REVIEW OF SYSTEMS:   Constitutional: Denies fevers, chills  or abnormal weight loss Eyes: Denies blurriness of vision Ears, nose, mouth, throat, and face: Denies mucositis or sore throat Respiratory: Denies cough, dyspnea or wheezes Cardiovascular: Denies palpitation, chest discomfort Gastrointestinal:  Denies nausea, heartburn or change in bowel habits Skin: Denies abnormal skin rashes Lymphatics: Denies new lymphadenopathy or easy bruising Neurological:Denies numbness, tingling or new weaknesses Behavioral/Psych: Mood is stable, no new changes  Extremities: No lower extremity edema Breast:  denies any pain or lumps or nodules in either breasts All other systems were reviewed with the patient and are negative.  I have reviewed the past medical history, past surgical history, social history and family history with the patient and they are unchanged from previous note.  ALLERGIES:  is allergic to pneumovax [pneumococcal polysaccharide vaccine]; sulfonamide derivatives; and epinephrine.  MEDICATIONS:  Current Outpatient Medications  Medication Sig Dispense Refill  . acetaminophen (TYLENOL) 500 MG tablet Take 1,500 mg by mouth 3 (three) times daily as needed for mild pain.     Marland Kitchen ALPRAZolam (XANAX) 1 MG tablet Take 1 mg by mouth daily as needed for anxiety.    . cyclobenzaprine (FLEXERIL) 10 MG tablet Take 10 mg by mouth 2 (two) times daily as needed for muscle spasms (migraines).     Marland Kitchen HYDROcodone-acetaminophen (NORCO) 7.5-325 MG tablet Take 1 tablet by mouth every 6 (six) hours as needed. for pain  0  . lidocaine (LIDODERM) 5 % lidocaine 5 % topical patch    . Multiple Vitamins-Minerals (EMERGEN-C VITAMIN C) PACK Take 1 packet by mouth daily.    . Pediatric Multivit-Minerals-C (CHILDRENS GUMMIES) CHEW Chew 2 each by mouth daily.    . tamoxifen (NOLVADEX) 20 MG tablet Take 1 tablet (20 mg total) by mouth daily. 90 tablet 3  . tiZANidine (ZANAFLEX) 2 MG tablet Take 1-4 mg by mouth every 8 hours when necessary pain and spasm 30 tablet 0  . topiramate  (TOPAMAX) 100 MG tablet Take 100 mg by mouth at bedtime.     Marland Kitchen venlafaxine XR (EFFEXOR XR) 37.5 MG 24 hr capsule Take 3 capsule daily in am 90 capsule 2  . zolpidem (AMBIEN) 10 MG tablet Take 1 tablet (10 mg total) by mouth at bedtime as needed. for sleep 30 tablet 3   No current facility-administered medications for this visit.     PHYSICAL EXAMINATION: ECOG PERFORMANCE STATUS: 1 - Symptomatic but completely ambulatory  Vitals:   04/28/18 0900  BP: 125/89  Pulse: 87  Resp: 20  Temp: 98.7 F (37.1 C)  SpO2: 100%   Filed Weights   04/28/18 0900  Weight: 200 lb 6.4 oz (90.9 kg)    GENERAL:alert, no distress and comfortable SKIN: skin color, texture, turgor are normal, no rashes or significant lesions EYES: normal, Conjunctiva are pink and non-injected, sclera clear OROPHARYNX:no exudate, no erythema and lips, buccal mucosa, and tongue normal  NECK: supple, thyroid normal size, non-tender, without nodularity LYMPH:  no palpable lymphadenopathy in the cervical, axillary or inguinal LUNGS: clear to auscultation and percussion with normal breathing effort HEART: regular rate & rhythm and no murmurs and no lower extremity edema ABDOMEN:abdomen soft, non-tender and normal bowel sounds MUSCULOSKELETAL:no cyanosis of digits and no clubbing  NEURO: alert & oriented x 3 with fluent speech, no  focal motor/sensory deficits EXTREMITIES: No lower extremity edema BREAST: No palpable masses or nodules in either right or left breasts. No palpable axillary supraclavicular or infraclavicular adenopathy no breast tenderness or nipple discharge. (exam performed in the presence of a chaperone)  LABORATORY DATA:  I have reviewed the data as listed CMP Latest Ref Rng & Units 01/29/2018 06/19/2017 06/02/2017  Glucose 70 - 140 mg/dL 93 75 88  BUN 7 - 26 mg/dL '21 13 17  '$ Creatinine 0.60 - 1.10 mg/dL 0.85 0.67 0.90  Sodium 136 - 145 mmol/L 140 135 139  Potassium 3.5 - 5.1 mmol/L 4.6 3.6 3.8  Chloride 98  - 109 mmol/L 110(H) 110 109  CO2 22 - 29 mmol/L '24 22 22  '$ Calcium 8.4 - 10.4 mg/dL 9.0 8.1(L) 9.0  Total Protein 6.4 - 8.3 g/dL 7.1 - -  Total Bilirubin 0.2 - 1.2 mg/dL 0.2 - -  Alkaline Phos 40 - 150 U/L 46 - -  AST 5 - 34 U/L 13 - -  ALT 0 - 55 U/L 13 - -    Lab Results  Component Value Date   WBC 5.9 01/29/2018   HGB 13.9 01/29/2018   HCT 41.6 01/29/2018   MCV 93.4 01/29/2018   PLT 208 01/29/2018   NEUTROABS 3.7 01/29/2018    ASSESSMENT & PLAN:  Malignant neoplasm of upper-outer quadrant of left breast in female, estrogen receptor positive ([place]) 06/19/2017:Left lumpectomy: IDC with DCIS, 2.1 cm, 0/9 lymph nodes negative margins negative; ER 95%, PR 95%, HER-2 negative ratio 1.32, Ki-67 20%, T2N0 stage Ib;  Right lumpectomy: IDC with DCIS 1.8 cm, focally involving lateral and anterior margins, 0/3 lymph nodes negative,reexcision anterior and lateral margins DCIS,ER 100%, PR 20%, HER-2 negative ratio 1.23, Ki-67 3%, T1CN0 stage I a  Oncotype DX recurrence score 17: Risk of recurrence 11% with tamoxifen alone There was not enough material to send Oncotype on the right breast. Adjuvant radiation therapy 08/13/2017-09/29/2017  Current treatment: Tamoxifen 20 mg daily originally started February 2019 Tamoxifen toxicities: 1.  Thinning of hair 2. Fatigue 3.  Insomnia: She would like to get a sleep study done.  I ordered a sleep study. 4.  Hot flashes: Effexor appears to be helping significantly. I discussed with her that she could take tamoxifen half a tablet twice a day instead of the 20 mg once a day.  The other option would be to reduce her dosage to 10 mg daily.  Vitamin D deficiency: Currently on vitamin D replacement therapy over-the-counter.  Breast cancer surveillance: 1.  Breast exam 04/28/2018: Benign 2. mammograms 03/22/2018: Benign breast density category C  Return to clinic in 1 year for follow-up    No orders of the defined types were placed in this  encounter.  The patient has a good understanding of the overall plan. she agrees with it. she will call with any problems that may develop before the next visit here.   Harriette Ohara, MD 04/28/18

## 2018-04-29 ENCOUNTER — Telehealth: Payer: Self-pay | Admitting: Hematology and Oncology

## 2018-04-29 NOTE — Telephone Encounter (Signed)
Mailed patient calendar of upcoming appts.  °

## 2018-05-06 ENCOUNTER — Telehealth: Payer: Self-pay

## 2018-05-06 DIAGNOSIS — G4700 Insomnia, unspecified: Secondary | ICD-10-CM

## 2018-05-06 NOTE — Telephone Encounter (Signed)
Returned Terry's call from [employer].  [employer] denied sleepy study inpatient, however approved for patient to have at home sleep study.  New order needed for at home sleep study.  Order placed, [place] aware.

## 2018-06-11 ENCOUNTER — Ambulatory Visit (HOSPITAL_BASED_OUTPATIENT_CLINIC_OR_DEPARTMENT_OTHER): Payer: 59 | Attending: Hematology and Oncology | Admitting: Internal Medicine

## 2018-06-11 DIAGNOSIS — G4700 Insomnia, unspecified: Secondary | ICD-10-CM | POA: Diagnosis not present

## 2018-06-18 ENCOUNTER — Other Ambulatory Visit: Payer: Self-pay | Admitting: Family Medicine

## 2018-06-18 NOTE — Telephone Encounter (Signed)
Name of Medication: Ambien Name of Pharmacy: [facility] [place] or Written Date and Quantity: 01/29/18 #30 tabs with 3 additional refills Last Office Visit and Type: 07/21/17 f/u appt Next Office Visit and Type: none scheduled  Last Controlled Substance Agreement Date: 12/09/13 Last UDS:12/09/13

## 2018-06-19 DIAGNOSIS — G4700 Insomnia, unspecified: Secondary | ICD-10-CM | POA: Diagnosis not present

## 2018-06-19 NOTE — Procedures (Signed)
   Patient Name: Margaret Rojas, Margaret Rojas Date: 06/12/2018 Gender: Female D.O.B: December 08, 1975 Age (years): 42 Referring Provider: Nicholas Lose Height (inches): 24 Interpreting Physician: Baird Lyons MD, ABSM Weight (lbs): 192 RPSGT: Jacolyn Reedy BMI: 28 MRN: [id number] Neck Size: 14.00  CLINICAL INFORMATION Sleep Study Type: HST Indication for sleep study: Insomnia  Epworth Sleepiness Score: 10  SLEEP STUDY TECHNIQUE A multi-channel overnight portable sleep study was performed. The channels recorded were: nasal airflow, thoracic respiratory movement, and oxygen saturation with a pulse oximetry. Snoring was also monitored.  MEDICATIONS Patient self administered medications include: none reported.  SLEEP ARCHITECTURE Patient was studied for 499.5 minutes. The sleep efficiency was 97.8 % and the patient was supine for 59.1%. The arousal index was 0.0 per hour.  RESPIRATORY PARAMETERS The overall AHI was 4.0 per hour, with a central apnea index of 0.0 per hour. The oxygen nadir was 85% during sleep.  CARDIAC DATA Mean heart rate during sleep was 65.6 bpm.  IMPRESSIONS - Occasional obstructive sleep apneas occurred during this study, within normal limits (AHI = 4.0/h). - No significant central sleep apnea occurred during this study (CAI = 0.0/h). - Oxygen desaturation was noted during this study (Min O2 = 85%, Mean 96%). Time with sat - Patient snored.  DIAGNOSIS - Normal study  RECOMMENDATIONS - Sleep hygiene should be reviewed to assess factors that may improve sleep quality. - Weight management and regular exercise should be initiated or continued.  [Electronically signed] 06/19/2018 12:11 PM  Baird Lyons MD, [place], American Board of Sleep Medicine   NPI: [id number]                          [place], [place] [facility]  ELECTRONICALLY SIGNED ON:  06/19/2018, 12:09 PM [place] PH: [phone]   FX: [phone] [place]

## 2018-06-21 ENCOUNTER — Other Ambulatory Visit: Payer: Self-pay | Admitting: Podiatry

## 2018-06-21 DIAGNOSIS — G8929 Other chronic pain: Secondary | ICD-10-CM

## 2018-06-21 DIAGNOSIS — M79672 Pain in left foot: Principal | ICD-10-CM

## 2018-06-25 ENCOUNTER — Ambulatory Visit
Admission: RE | Admit: 2018-06-25 | Discharge: 2018-06-25 | Disposition: A | Discharge status: Home / Self Care | Payer: 59 | Source: Ambulatory Visit | Attending: Podiatry | Admitting: Podiatry

## 2018-06-25 DIAGNOSIS — M79672 Pain in left foot: Principal | ICD-10-CM

## 2018-06-25 DIAGNOSIS — G8929 Other chronic pain: Secondary | ICD-10-CM

## 2018-06-25 IMAGING — MG BREAST SURGICAL SPECIMEN
1 series · 1 of 1 positions shown · non-contrast
Comparison: Previous exam(s).

CLINICAL DATA: Evaluate right specimen

EXAM:
SPECIMEN RADIOGRAPH OF THE RIGHT BREAST

[R]
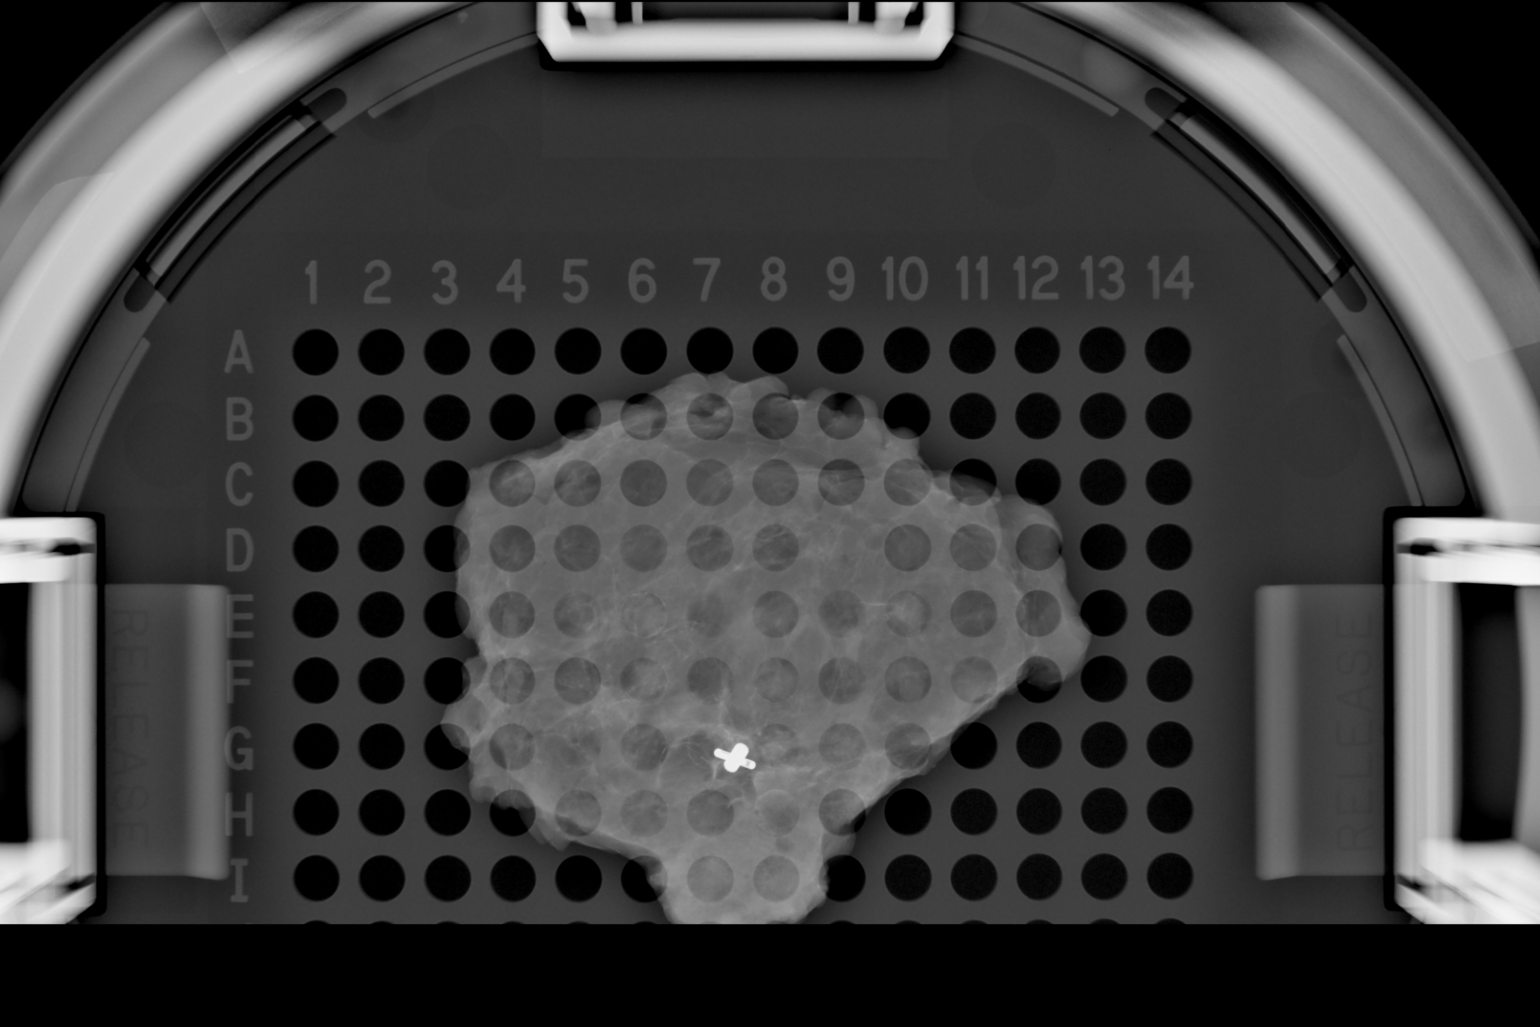

[1 of 1 positions shown; findings below may reference images not displayed]

FINDINGS: Status post excision of the right breast. The radioactive seed and
biopsy marker clip are present, completely intact, and were marked
for pathology.
IMPRESSION: Specimen radiograph of the right breast.

## 2018-06-25 IMAGING — MG BREAST SURGICAL SPECIMEN
1 series · 1 of 1 positions shown · non-contrast
Comparison: Previous exam(s).

CLINICAL DATA: Evaluate specimen

EXAM:
SPECIMEN RADIOGRAPH OF THE LEFT BREAST

[L]
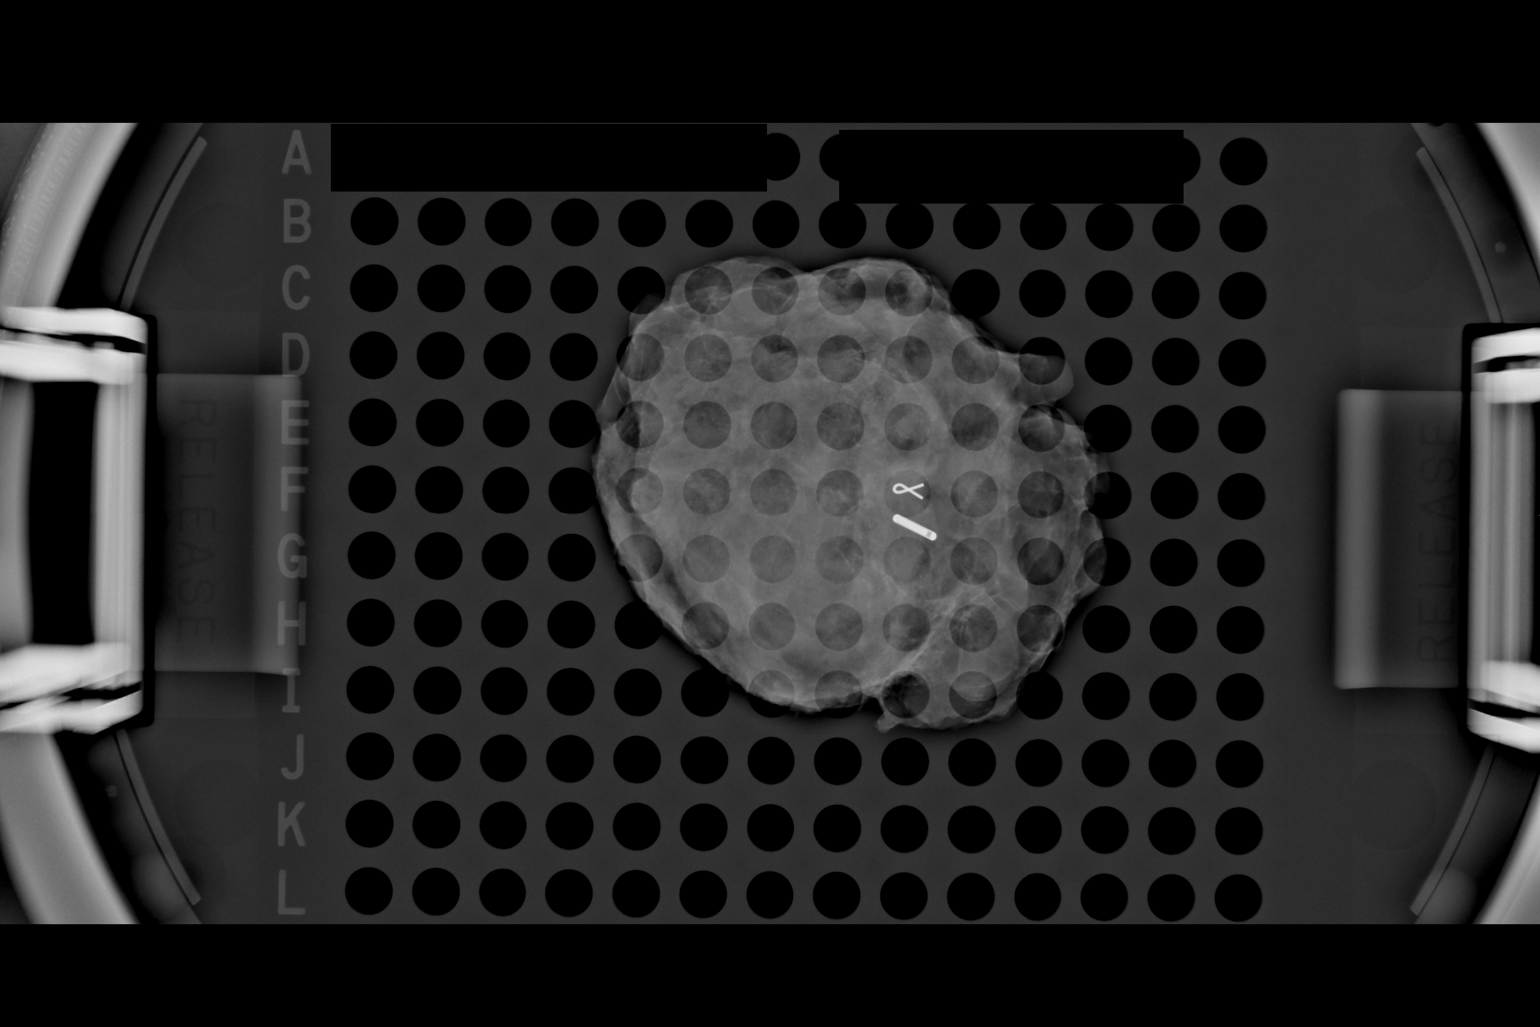

[1 of 1 positions shown; findings below may reference images not displayed]

FINDINGS: Status post excision of the left breast. The radioactive seed and
biopsy marker clip are present, completely intact, and were marked
for pathology.
IMPRESSION: Specimen radiograph of the left breast.

## 2018-07-05 ENCOUNTER — Encounter (HOSPITAL_COMMUNITY): Payer: Self-pay | Admitting: Psychiatry

## 2018-07-05 ENCOUNTER — Ambulatory Visit (INDEPENDENT_AMBULATORY_CARE_PROVIDER_SITE_OTHER): Payer: 59 | Admitting: Psychiatry

## 2018-07-05 VITALS — BP 112/68 | Ht 69.0 in | Wt 201.0 lb

## 2018-07-05 DIAGNOSIS — F410 Panic disorder [episodic paroxysmal anxiety] without agoraphobia: Secondary | ICD-10-CM | POA: Diagnosis not present

## 2018-07-05 DIAGNOSIS — F411 Generalized anxiety disorder: Secondary | ICD-10-CM

## 2018-07-05 DIAGNOSIS — F330 Major depressive disorder, recurrent, mild: Secondary | ICD-10-CM

## 2018-07-05 DIAGNOSIS — Z87891 Personal history of nicotine dependence: Secondary | ICD-10-CM | POA: Diagnosis not present

## 2018-07-05 MED ORDER — DULOXETINE HCL 20 MG PO CPEP: ORAL_CAPSULE | ORAL | 0 refills | Status: DC
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
## 2018-07-06 ENCOUNTER — Other Ambulatory Visit (HOSPITAL_COMMUNITY): Payer: Self-pay

## 2018-07-06 MED ORDER — DULOXETINE HCL 60 MG PO CPEP: 60.0000 mg | ORAL_CAPSULE | Freq: Every day | ORAL | 2 refills | Status: DC

## 2018-07-07 ENCOUNTER — Other Ambulatory Visit (HOSPITAL_COMMUNITY): Payer: Self-pay

## 2018-07-07 DIAGNOSIS — F330 Major depressive disorder, recurrent, mild: Secondary | ICD-10-CM

## 2018-07-07 MED ORDER — VENLAFAXINE HCL ER 37.5 MG PO CP24: ORAL_CAPSULE | ORAL | 0 refills | Status: DC

## 2018-07-27 ENCOUNTER — Ambulatory Visit
Admission: RE | Admit: 2018-07-27 | Discharge: 2018-07-27 | Disposition: A | Discharge status: Home / Self Care | Payer: Worker's Compensation | Source: Ambulatory Visit | Attending: Nurse Practitioner | Admitting: Nurse Practitioner

## 2018-07-27 ENCOUNTER — Other Ambulatory Visit: Payer: Self-pay | Admitting: Nurse Practitioner

## 2018-07-27 DIAGNOSIS — M25461 Effusion, right knee: Secondary | ICD-10-CM

## 2018-07-30 ENCOUNTER — Ambulatory Visit (INDEPENDENT_AMBULATORY_CARE_PROVIDER_SITE_OTHER): Payer: 59 | Admitting: Family Medicine

## 2018-07-30 ENCOUNTER — Encounter: Payer: Self-pay | Admitting: Family Medicine

## 2018-07-30 VITALS — BP 124/72 | HR 87 | Temp 97.3°F | Ht 67.5 in | Wt 201.5 lb

## 2018-07-30 DIAGNOSIS — F4323 Adjustment disorder with mixed anxiety and depressed mood: Secondary | ICD-10-CM | POA: Diagnosis not present

## 2018-07-30 DIAGNOSIS — F5104 Psychophysiologic insomnia: Secondary | ICD-10-CM | POA: Diagnosis not present

## 2018-07-30 DIAGNOSIS — E559 Vitamin D deficiency, unspecified: Secondary | ICD-10-CM | POA: Insufficient documentation

## 2018-07-30 DIAGNOSIS — Z0000 Encounter for general adult medical examination without abnormal findings: Secondary | ICD-10-CM | POA: Diagnosis not present

## 2018-07-30 DIAGNOSIS — C50412 Malignant neoplasm of upper-outer quadrant of left female breast: Secondary | ICD-10-CM

## 2018-07-30 DIAGNOSIS — C50511 Malignant neoplasm of lower-outer quadrant of right female breast: Secondary | ICD-10-CM

## 2018-07-30 DIAGNOSIS — Z170 Estrogen receptor positive status [ER+]: Secondary | ICD-10-CM
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
## 2018-07-30 NOTE — Patient Instructions (Addendum)
Take care of yourself   Eat a healthy diet and stay active (keep exercising)  Use your sunscreen  See dermatology as planned   Labs today

## 2018-07-31 LAB — COMPREHENSIVE METABOLIC PANEL
AG Ratio: 1.3 (calc) (ref 1.0–2.5)
ALBUMIN MSPROF: 4 g/dL (ref 3.6–5.1)
ALT: 11 U/L (ref 6–29)
AST: 16 U/L (ref 10–30)
Alkaline phosphatase (APISO): 45 U/L (ref 33–115)
BUN: 15 mg/dL (ref 7–25)
CALCIUM: 9.2 mg/dL (ref 8.6–10.2)
CO2: 18 mmol/L — AB (ref 20–32)
CREATININE: 0.89 mg/dL (ref 0.50–1.10)
Chloride: 110 mmol/L (ref 98–110)
GLOBULIN: 3 g/dL (ref 1.9–3.7)
GLUCOSE: 145 mg/dL — AB (ref 65–99)
Potassium: 4 mmol/L (ref 3.5–5.3)
Sodium: 143 mmol/L (ref 135–146)
Total Bilirubin: 0.2 mg/dL (ref 0.2–1.2)
Total Protein: 7 g/dL (ref 6.1–8.1)

## 2018-07-31 LAB — CBC WITH DIFFERENTIAL/PLATELET
Basophils Absolute: 29 cells/uL (ref 0–200)
Basophils Relative: 0.5 %
Eosinophils Absolute: 41 cells/uL (ref 15–500)
Eosinophils Relative: 0.7 %
HCT: 40.7 % (ref 35.0–45.0)
Hemoglobin: 13.8 g/dL (ref 11.7–15.5)
Lymphs Abs: 2047 cells/uL (ref 850–3900)
MCH: 30.7 pg (ref 27.0–33.0)
MCHC: 33.9 g/dL (ref 32.0–36.0)
MCV: 90.6 fL (ref 80.0–100.0)
MPV: 12 fL (ref 7.5–12.5)
Monocytes Relative: 8.3 %
NEUTROS PCT: 55.2 %
Neutro Abs: 3202 cells/uL (ref 1500–7800)
PLATELETS: 246 10*3/uL (ref 140–400)
RBC: 4.49 10*6/uL (ref 3.80–5.10)
RDW: 12 % (ref 11.0–15.0)
TOTAL LYMPHOCYTE: 35.3 %
WBC: 5.8 10*3/uL (ref 3.8–10.8)
WBCMIX: 481 {cells}/uL (ref 200–950)

## 2018-07-31 LAB — LIPID PANEL
Cholesterol: 227 mg/dL — ABNORMAL HIGH
HDL: 68 mg/dL
LDL Cholesterol (Calc): 134 mg/dL — ABNORMAL HIGH
Non-HDL Cholesterol (Calc): 159 mg/dL — ABNORMAL HIGH
Total CHOL/HDL Ratio: 3.3 (calc)
Triglycerides: 133 mg/dL

## 2018-07-31 LAB — TSH: TSH: 0.51 m[IU]/L

## 2018-07-31 LAB — VITAMIN D 25 HYDROXY (VIT D DEFICIENCY, FRACTURES): VIT D 25 HYDROXY: 39 ng/mL (ref 30–100)

## 2018-08-01 ENCOUNTER — Encounter: Payer: Self-pay | Admitting: Family Medicine

## 2018-08-01 DIAGNOSIS — E785 Hyperlipidemia, unspecified: Secondary | ICD-10-CM | POA: Insufficient documentation

## 2018-08-01 NOTE — Assessment & Plan Note (Signed)
Now seeing psychiatry  Still struggles with sleep/ Lorrin Mais helps Taking cymbalta for mood and pain  Xanax prn

## 2018-08-01 NOTE — Assessment & Plan Note (Signed)
Continues ambien  Disc sleep hygeine  Her tamoxifen may worsen sleep problems

## 2018-08-01 NOTE — Assessment & Plan Note (Signed)
Taking 5000 iu daily of D3 Level today Urged to consider dexa (on tamoxifen)- will d/w her oncologist  Disc imp to bone and overall health

## 2018-08-01 NOTE — Assessment & Plan Note (Signed)
S/p tx on tamoxifen  Sees oncology

## 2018-08-01 NOTE — Assessment & Plan Note (Signed)
Reviewed health habits including diet and exercise and skin cancer prevention Reviewed appropriate screening tests for age  Also reviewed health mt list, fam hx and immunization status , as well as social and family history   See HPI Labs ordered  Enc healthy diet and exercise  Continue breast cancer f/u and psychiatry f/u

## 2018-08-01 NOTE — Assessment & Plan Note (Signed)
S/p tx on tamoxfen Continues oncology f/u

## 2018-08-08 ENCOUNTER — Ambulatory Visit
Admission: RE | Admit: 2018-08-08 | Discharge: 2018-08-08 | Disposition: A | Discharge status: Home / Self Care | Payer: 59 | Source: Ambulatory Visit | Attending: Hematology and Oncology | Admitting: Hematology and Oncology

## 2018-08-08 DIAGNOSIS — Z1231 Encounter for screening mammogram for malignant neoplasm of breast: Secondary | ICD-10-CM

## 2018-08-08 MED ORDER — GADOBUTROL 1 MMOL/ML IV SOLN
10.0000 mL | Freq: Once | INTRAVENOUS | Status: AC | PRN
  Administered 2018-08-08: 10 mL via INTRAVENOUS

## 2018-08-09 ENCOUNTER — Ambulatory Visit (INDEPENDENT_AMBULATORY_CARE_PROVIDER_SITE_OTHER): Payer: 59 | Admitting: Psychiatry

## 2018-08-09 ENCOUNTER — Encounter (HOSPITAL_COMMUNITY): Payer: Self-pay | Admitting: Psychiatry

## 2018-08-09 VITALS — BP 125/97 | HR 88 | Ht 68.0 in | Wt 203.0 lb

## 2018-08-09 DIAGNOSIS — F330 Major depressive disorder, recurrent, mild: Secondary | ICD-10-CM

## 2018-08-09 DIAGNOSIS — F411 Generalized anxiety disorder: Secondary | ICD-10-CM | POA: Diagnosis not present

## 2018-08-09 MED ORDER — DULOXETINE HCL 60 MG PO CPEP: 60.0000 mg | ORAL_CAPSULE | Freq: Every day | ORAL | 0 refills | Status: DC
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
## 2018-08-23 ENCOUNTER — Telehealth (HOSPITAL_COMMUNITY): Payer: Self-pay

## 2018-08-23 MED ORDER — ESZOPICLONE 1 MG PO TABS: 1.0000 mg | ORAL_TABLET | Freq: Every evening | ORAL | 0 refills | Status: DC | PRN

## 2018-08-23 NOTE — Telephone Encounter (Signed)
Patient is calling to let you know that the Margaret Rojas is affordable through her insurance and would like you to send a prescription to the pharmacy

## 2018-08-23 NOTE — Telephone Encounter (Signed)
Lunesta 1 mg called into her local pharmacy, [facility] in [place].  She should not be taking Ambien.

## 2018-09-20 IMAGING — CR DG CHEST 2V
2 series · 2 of 2 positions shown · non-contrast
Comparison: PA and lateral chest x-ray May 10, 2013

CLINICAL DATA: Four weeks of cough. The patient has undergone
bilateral breast radiation for malignancy.

EXAM:
CHEST  2 VIEW

[w chest pa]
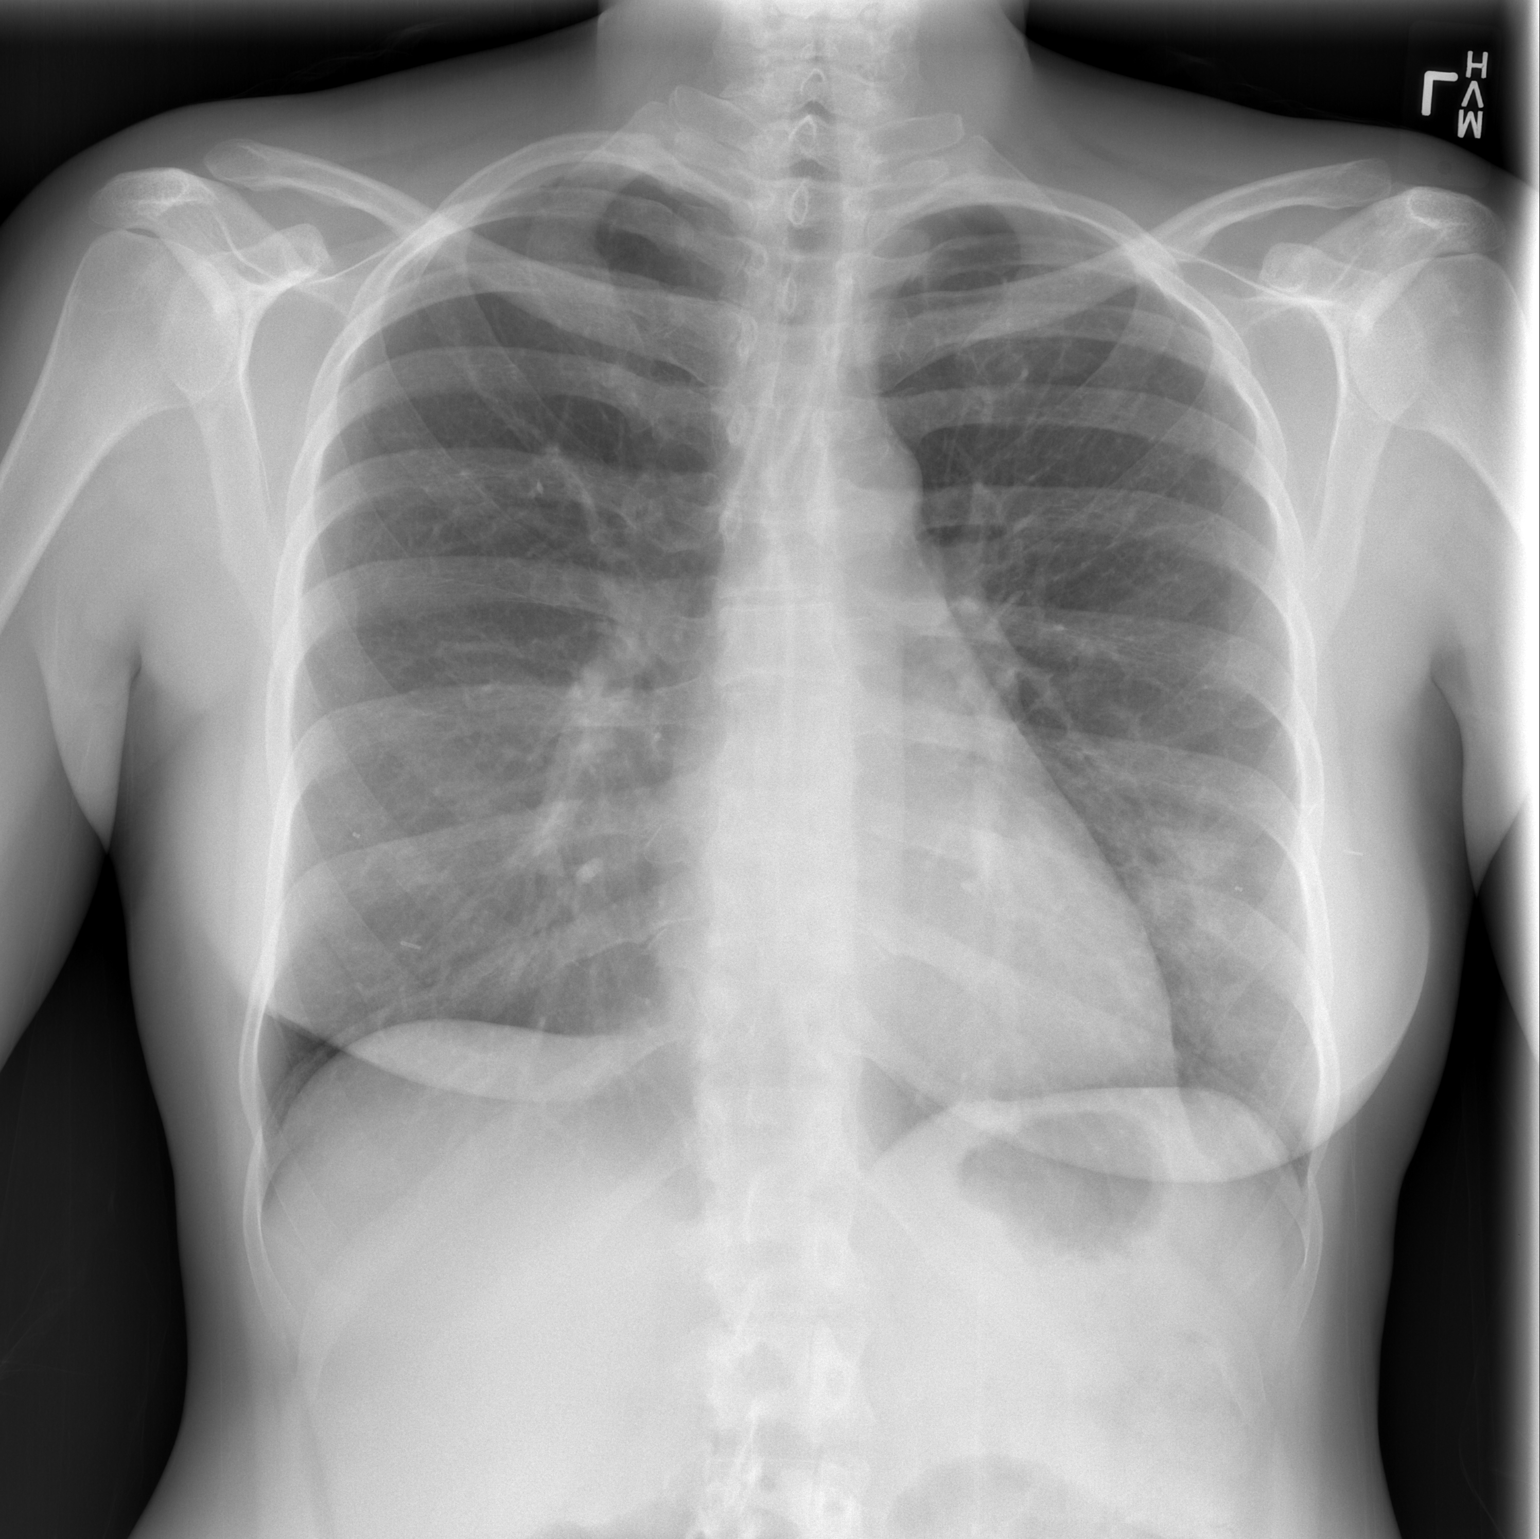

[w chest lat]
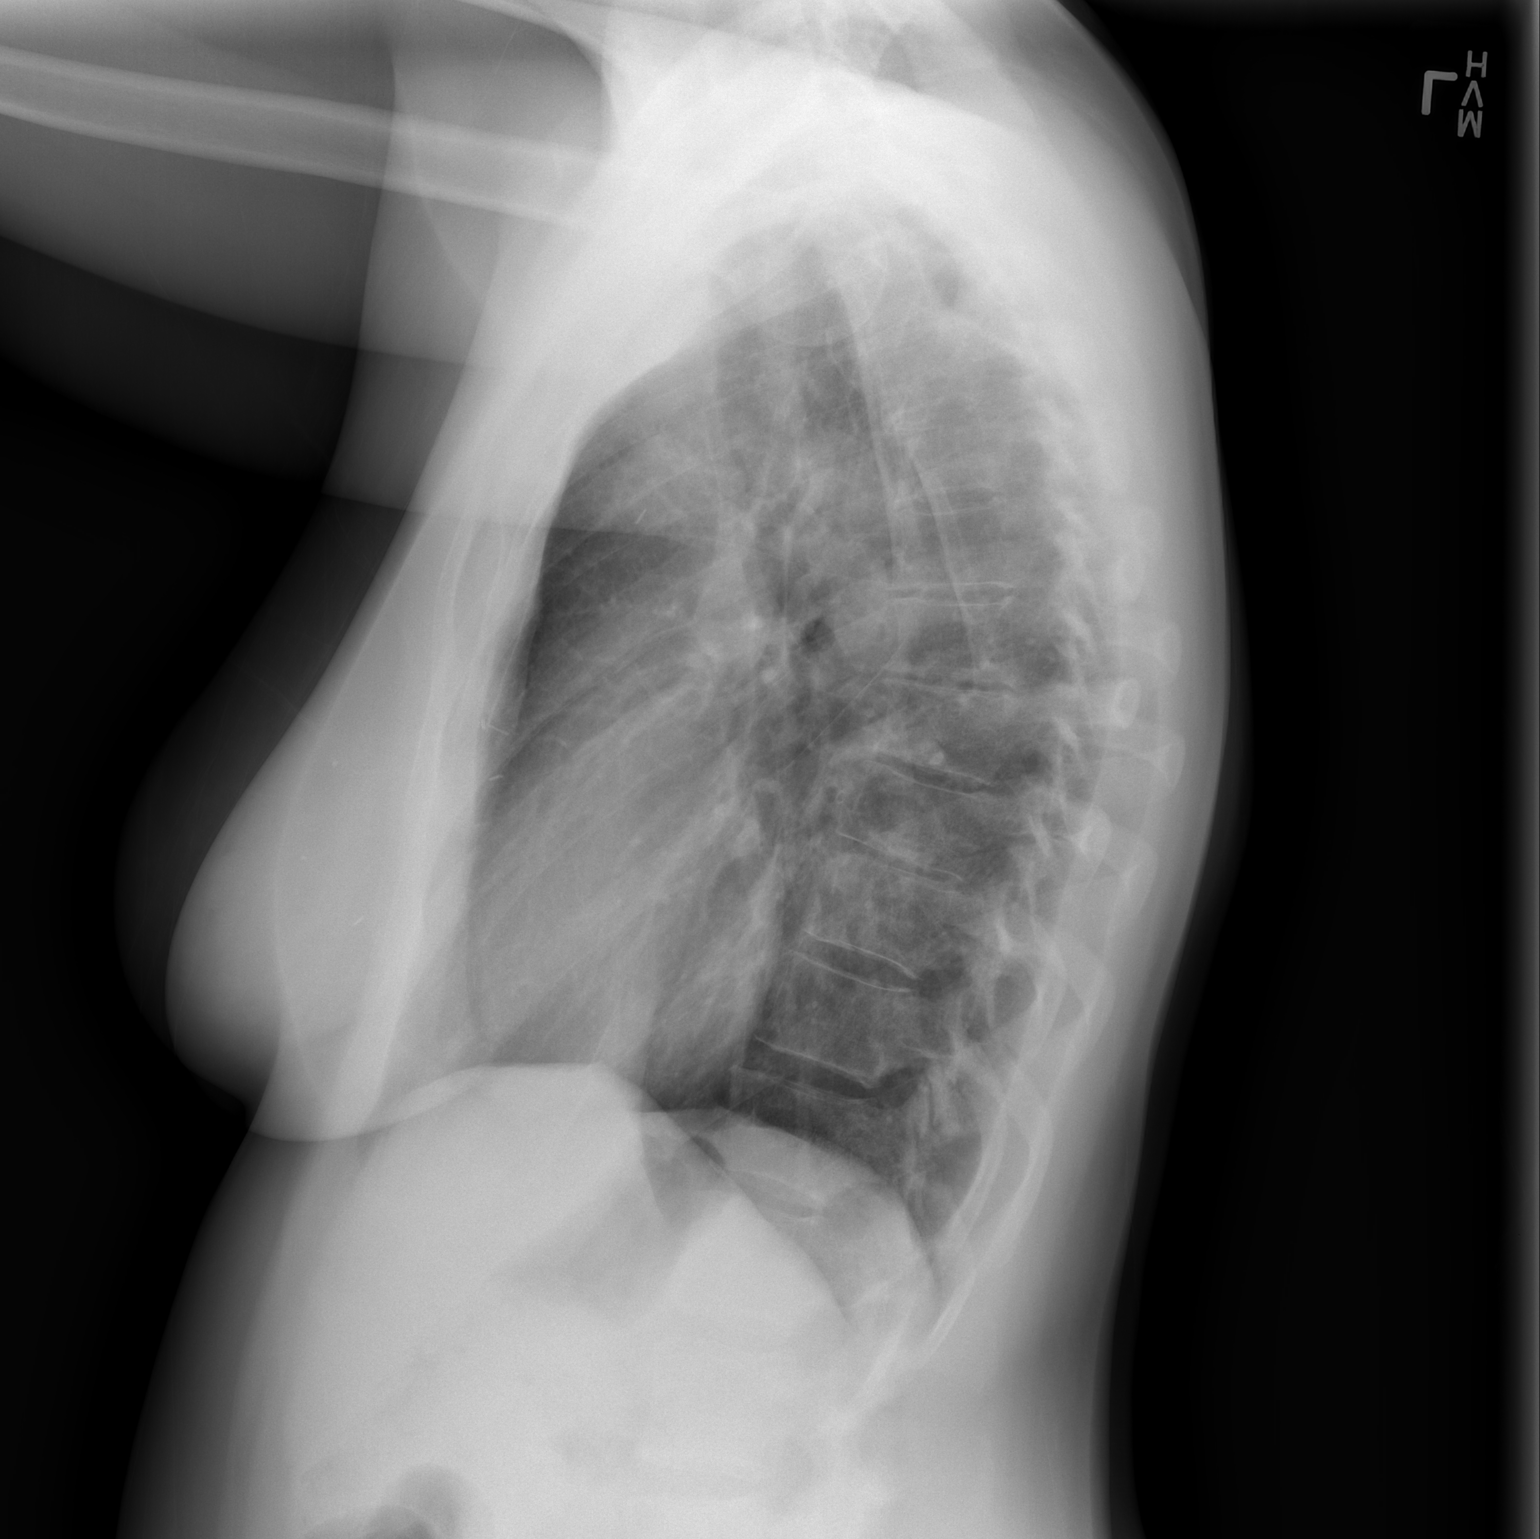

[2 of 2 positions shown; findings below may reference images not displayed]

FINDINGS: There is increased density in the left lower lobe posteriorly. The
right lung is clear. The heart and pulmonary vascularity are normal.
The mediastinum is normal in width. There is no pleural effusion.
There is mild degenerative disc disease centered in the midthoracic
spine.
IMPRESSION: Increased parenchymal density in the left lower lobe is worrisome
for pneumonia. Followup PA and lateral chest X-ray is recommended in
3-4 weeks following trial of antibiotic therapy to ensure resolution
and exclude underlying malignancy.

## 2018-10-05 ENCOUNTER — Other Ambulatory Visit: Payer: Self-pay | Admitting: *Deleted

## 2018-10-05 DIAGNOSIS — Z170 Estrogen receptor positive status [ER+]: Principal | ICD-10-CM

## 2018-10-05 DIAGNOSIS — C50412 Malignant neoplasm of upper-outer quadrant of left female breast: Secondary | ICD-10-CM

## 2018-10-06 ENCOUNTER — Ambulatory Visit: Payer: 59 | Attending: Hematology and Oncology | Admitting: Rehabilitation

## 2018-10-06 ENCOUNTER — Other Ambulatory Visit: Payer: Self-pay

## 2018-10-06 ENCOUNTER — Encounter: Payer: Self-pay | Admitting: Rehabilitation

## 2018-10-06 DIAGNOSIS — Z483 Aftercare following surgery for neoplasm: Secondary | ICD-10-CM

## 2018-10-06 DIAGNOSIS — I890 Lymphedema, not elsewhere classified: Secondary | ICD-10-CM | POA: Insufficient documentation

## 2018-10-06 DIAGNOSIS — R293 Abnormal posture: Secondary | ICD-10-CM | POA: Diagnosis not present

## 2018-10-06 DIAGNOSIS — M25612 Stiffness of left shoulder, not elsewhere classified: Secondary | ICD-10-CM | POA: Insufficient documentation

## 2018-10-06 DIAGNOSIS — M79602 Pain in left arm: Secondary | ICD-10-CM | POA: Diagnosis present

## 2018-10-06 NOTE — Patient Instructions (Signed)
Access Code: [email or web address]  URL: [email or web address]  Date: 10/06/2018  Prepared by: Shan Levans   Exercises  Standing Median Nerve Glide - 10 reps - 2 second hold - 1-2x daily - 7x weekly  Sidelying Thoracic Rotation with Open Book - 5-10 reps - 10 sec hold - 1x daily - 7x weekly  Mermaid - 10 reps - 1 sets - 2 seconds hold - 2x daily - 7x weekly
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
## 2018-10-07 ENCOUNTER — Ambulatory Visit: Payer: 59 | Admitting: Physical Therapy

## 2018-10-07 ENCOUNTER — Encounter: Payer: Self-pay | Admitting: Physical Therapy

## 2018-10-07 DIAGNOSIS — M79602 Pain in left arm: Secondary | ICD-10-CM

## 2018-10-07 DIAGNOSIS — M25612 Stiffness of left shoulder, not elsewhere classified: Secondary | ICD-10-CM

## 2018-10-07 DIAGNOSIS — I890 Lymphedema, not elsewhere classified: Secondary | ICD-10-CM

## 2018-10-07 DIAGNOSIS — R293 Abnormal posture: Secondary | ICD-10-CM

## 2018-10-07 DIAGNOSIS — Z483 Aftercare following surgery for neoplasm: Secondary | ICD-10-CM
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
## 2018-10-08 ENCOUNTER — Encounter: Payer: Self-pay | Admitting: Physical Therapy

## 2018-10-11 ENCOUNTER — Encounter: Payer: Self-pay | Admitting: Rehabilitation

## 2018-10-11 ENCOUNTER — Ambulatory Visit: Payer: 59 | Admitting: Rehabilitation

## 2018-10-11 DIAGNOSIS — Z483 Aftercare following surgery for neoplasm: Secondary | ICD-10-CM

## 2018-10-11 DIAGNOSIS — M25612 Stiffness of left shoulder, not elsewhere classified: Secondary | ICD-10-CM

## 2018-10-11 DIAGNOSIS — R293 Abnormal posture: Secondary | ICD-10-CM

## 2018-10-11 DIAGNOSIS — I890 Lymphedema, not elsewhere classified: Secondary | ICD-10-CM

## 2018-10-11 DIAGNOSIS — M79602 Pain in left arm: Secondary | ICD-10-CM
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
## 2018-10-13 ENCOUNTER — Encounter: Payer: Self-pay | Admitting: Rehabilitation

## 2018-10-13 ENCOUNTER — Ambulatory Visit: Payer: 59 | Admitting: Rehabilitation

## 2018-10-13 DIAGNOSIS — R293 Abnormal posture: Secondary | ICD-10-CM

## 2018-10-13 DIAGNOSIS — M79602 Pain in left arm: Secondary | ICD-10-CM

## 2018-10-13 DIAGNOSIS — M25612 Stiffness of left shoulder, not elsewhere classified: Secondary | ICD-10-CM

## 2018-10-13 DIAGNOSIS — Z483 Aftercare following surgery for neoplasm: Secondary | ICD-10-CM

## 2018-10-13 DIAGNOSIS — I890 Lymphedema, not elsewhere classified: Secondary | ICD-10-CM
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
## 2018-10-19 ENCOUNTER — Other Ambulatory Visit: Payer: Self-pay

## 2018-10-19 ENCOUNTER — Encounter: Payer: Self-pay | Admitting: Physical Therapy

## 2018-10-19 ENCOUNTER — Ambulatory Visit: Payer: 59 | Attending: Hematology and Oncology | Admitting: Physical Therapy

## 2018-10-19 DIAGNOSIS — R293 Abnormal posture: Secondary | ICD-10-CM | POA: Diagnosis present

## 2018-10-19 DIAGNOSIS — Z483 Aftercare following surgery for neoplasm: Secondary | ICD-10-CM | POA: Diagnosis present

## 2018-10-19 DIAGNOSIS — M25611 Stiffness of right shoulder, not elsewhere classified: Secondary | ICD-10-CM | POA: Diagnosis present

## 2018-10-19 DIAGNOSIS — I890 Lymphedema, not elsewhere classified: Secondary | ICD-10-CM | POA: Insufficient documentation

## 2018-10-19 DIAGNOSIS — M25612 Stiffness of left shoulder, not elsewhere classified: Secondary | ICD-10-CM | POA: Diagnosis present

## 2018-10-19 DIAGNOSIS — M79602 Pain in left arm: Secondary | ICD-10-CM | POA: Insufficient documentation
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
## 2018-10-21 ENCOUNTER — Other Ambulatory Visit: Payer: Self-pay

## 2018-10-21 ENCOUNTER — Encounter: Payer: Self-pay | Admitting: Physical Therapy

## 2018-10-21 ENCOUNTER — Ambulatory Visit: Payer: 59 | Admitting: Physical Therapy

## 2018-10-21 DIAGNOSIS — Z483 Aftercare following surgery for neoplasm: Secondary | ICD-10-CM

## 2018-10-21 DIAGNOSIS — M79602 Pain in left arm: Secondary | ICD-10-CM
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
## 2018-10-22 ENCOUNTER — Encounter: Payer: Self-pay | Admitting: Rehabilitation

## 2018-10-25 ENCOUNTER — Ambulatory Visit: Payer: 59 | Admitting: Physical Therapy

## 2018-10-25 ENCOUNTER — Other Ambulatory Visit: Payer: Self-pay

## 2018-10-25 ENCOUNTER — Encounter: Payer: Self-pay | Admitting: Physical Therapy

## 2018-10-25 DIAGNOSIS — Z483 Aftercare following surgery for neoplasm: Secondary | ICD-10-CM

## 2018-10-25 DIAGNOSIS — M79602 Pain in left arm: Secondary | ICD-10-CM
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
## 2018-10-29 ENCOUNTER — Encounter: Payer: Self-pay | Admitting: Physical Therapy

## 2018-10-29 ENCOUNTER — Ambulatory Visit: Payer: 59 | Admitting: Physical Therapy

## 2018-10-29 DIAGNOSIS — M79602 Pain in left arm: Secondary | ICD-10-CM

## 2018-10-29 DIAGNOSIS — I890 Lymphedema, not elsewhere classified: Secondary | ICD-10-CM

## 2018-10-29 DIAGNOSIS — R293 Abnormal posture: Secondary | ICD-10-CM

## 2018-10-29 DIAGNOSIS — M25612 Stiffness of left shoulder, not elsewhere classified: Secondary | ICD-10-CM

## 2018-10-29 DIAGNOSIS — Z483 Aftercare following surgery for neoplasm: Secondary | ICD-10-CM | POA: Diagnosis not present
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
## 2018-11-03 ENCOUNTER — Encounter: Payer: Self-pay | Admitting: Rehabilitation

## 2018-11-03 ENCOUNTER — Ambulatory Visit: Payer: 59

## 2018-11-03 DIAGNOSIS — M79602 Pain in left arm: Secondary | ICD-10-CM

## 2018-11-03 DIAGNOSIS — M25612 Stiffness of left shoulder, not elsewhere classified: Secondary | ICD-10-CM

## 2018-11-03 DIAGNOSIS — I890 Lymphedema, not elsewhere classified: Secondary | ICD-10-CM

## 2018-11-03 DIAGNOSIS — R293 Abnormal posture: Secondary | ICD-10-CM

## 2018-11-03 DIAGNOSIS — Z483 Aftercare following surgery for neoplasm: Secondary | ICD-10-CM
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
## 2018-11-08 ENCOUNTER — Ambulatory Visit: Payer: 59 | Admitting: Rehabilitation

## 2018-11-08 ENCOUNTER — Encounter: Payer: Self-pay | Admitting: Rehabilitation

## 2018-11-08 ENCOUNTER — Other Ambulatory Visit (HOSPITAL_COMMUNITY): Payer: Self-pay

## 2018-11-08 DIAGNOSIS — M79602 Pain in left arm: Secondary | ICD-10-CM

## 2018-11-08 DIAGNOSIS — Z483 Aftercare following surgery for neoplasm: Secondary | ICD-10-CM | POA: Diagnosis not present

## 2018-11-08 DIAGNOSIS — F411 Generalized anxiety disorder: Secondary | ICD-10-CM

## 2018-11-08 DIAGNOSIS — I890 Lymphedema, not elsewhere classified: Secondary | ICD-10-CM

## 2018-11-08 DIAGNOSIS — R293 Abnormal posture: Secondary | ICD-10-CM

## 2018-11-08 DIAGNOSIS — M25611 Stiffness of right shoulder, not elsewhere classified: Secondary | ICD-10-CM

## 2018-11-08 DIAGNOSIS — F330 Major depressive disorder, recurrent, mild: Secondary | ICD-10-CM

## 2018-11-08 DIAGNOSIS — M25612 Stiffness of left shoulder, not elsewhere classified: Secondary | ICD-10-CM

## 2018-11-08 MED ORDER — DULOXETINE HCL 60 MG PO CPEP: 60.0000 mg | ORAL_CAPSULE | Freq: Every day | ORAL | 0 refills | Status: DC
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
## 2018-11-08 NOTE — Patient Instructions (Signed)
We will decrease to 1x per week x 3 weeks

## 2018-11-09 ENCOUNTER — Ambulatory Visit (HOSPITAL_COMMUNITY): Payer: Self-pay | Admitting: Psychiatry

## 2018-11-10 ENCOUNTER — Encounter: Payer: Self-pay | Admitting: Rehabilitation

## 2018-11-17 ENCOUNTER — Ambulatory Visit: Payer: 59 | Admitting: Rehabilitation

## 2018-11-17 ENCOUNTER — Encounter: Payer: Self-pay | Admitting: Rehabilitation

## 2018-11-17 DIAGNOSIS — M25611 Stiffness of right shoulder, not elsewhere classified: Secondary | ICD-10-CM | POA: Insufficient documentation

## 2018-11-17 DIAGNOSIS — R293 Abnormal posture: Secondary | ICD-10-CM | POA: Insufficient documentation

## 2018-11-17 DIAGNOSIS — Z79899 Other long term (current) drug therapy: Secondary | ICD-10-CM | POA: Diagnosis not present

## 2018-11-17 DIAGNOSIS — Z483 Aftercare following surgery for neoplasm: Secondary | ICD-10-CM

## 2018-11-17 DIAGNOSIS — M25612 Stiffness of left shoulder, not elsewhere classified: Secondary | ICD-10-CM | POA: Insufficient documentation

## 2018-11-17 DIAGNOSIS — Z170 Estrogen receptor positive status [ER+]: Secondary | ICD-10-CM | POA: Diagnosis not present

## 2018-11-17 DIAGNOSIS — Z79810 Long term (current) use of selective estrogen receptor modulators (SERMs): Secondary | ICD-10-CM | POA: Diagnosis not present

## 2018-11-17 DIAGNOSIS — C50412 Malignant neoplasm of upper-outer quadrant of left female breast: Secondary | ICD-10-CM | POA: Diagnosis not present

## 2018-11-17 DIAGNOSIS — I890 Lymphedema, not elsewhere classified: Secondary | ICD-10-CM | POA: Insufficient documentation

## 2018-11-17 NOTE — Patient Instructions (Signed)
Deep Effective Breath   Standing, sitting, or laying down, place both hands on the belly. Take a deep breath IN, expanding the belly; then breath OUT, contracting the belly. Repeat __5__ times.    2.)On left side make 5 circles in the armpit,      Copyright  VHI. All rights reserved.  Axilla to Inguinal Nodes - Sweep   3.) On involved side, make 5 circles at groin at panty line,   4.)then pump _5__ times from armpit along side of trunk to outer hip, making your other pathway. ( Can do it with your opposite hand on yourself) Can also try this sidelying   5.) Then spend some time working near the armpit or wherever swelling is located    Do the following if the arm feels swollen:   Copyright  VHI. All rights reserved.  Arm Posterior: Elbow to Shoulder - Sweep   6.)Pump _5__ times from back of elbow to top of shoulder.  7.) Then inner to outer upper arm _5_ times, then outer arm again _5_ times.   Copyright  VHI. All rights reserved.  ARM: [place] Wrist to Elbow - Sweep   8.) Pump or stationary circles _5__ times from wrist to elbow making sure to do both sides of the forearm.   9.) Then work your way back up the upper arm and back down the side of the body _2-3_ times each. Then do 5 circles again at involved groin where you started! Good job!! Do __1_ time per day.   Breast work:   Marland Kitchen Draw an imaginary diagonal line from upper outer breast through the nipple area toward lower inner breast.  Direct fluid upward and inward from this line toward the pathway across your upper chest .  Do this in three rows to treat all of the upper inner breast tissue, and do each row 3-4x.      Direct fluid to treat all of lower outer breast tissue downward and outward toward      pathway that is aimed at the left groin.  Finish by doing the pathways as described above going from your involved armpit to the same side groin and going across your upper chest from the involved shoulder to  the uninvolved shoulder.  Repeat the steps above where you do circles in your left groin and right armpit. Copyright  VHI. All rights reserved.
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
## 2018-11-18 ENCOUNTER — Telehealth: Payer: Self-pay

## 2018-11-18 NOTE — Telephone Encounter (Signed)
Received message from nurse navigator regarding pt needing an appt to see Dr.Gudena with concern of left breast swelling. Pt going through physical therapy and was advised to make sure she get an appt for evaluation with oncologist. Made appt per request and confirmed time/date.

## 2018-11-22 ENCOUNTER — Ambulatory Visit: Payer: 59 | Admitting: Rehabilitation

## 2018-11-22 ENCOUNTER — Inpatient Hospital Stay: Payer: 59 | Attending: Hematology and Oncology | Admitting: Hematology and Oncology

## 2018-11-22 ENCOUNTER — Encounter: Payer: Self-pay | Admitting: Rehabilitation

## 2018-11-22 DIAGNOSIS — Z79899 Other long term (current) drug therapy: Secondary | ICD-10-CM | POA: Insufficient documentation

## 2018-11-22 DIAGNOSIS — M25612 Stiffness of left shoulder, not elsewhere classified: Secondary | ICD-10-CM

## 2018-11-22 DIAGNOSIS — M25611 Stiffness of right shoulder, not elsewhere classified: Secondary | ICD-10-CM

## 2018-11-22 DIAGNOSIS — C50412 Malignant neoplasm of upper-outer quadrant of left female breast: Secondary | ICD-10-CM

## 2018-11-22 DIAGNOSIS — Z79810 Long term (current) use of selective estrogen receptor modulators (SERMs): Secondary | ICD-10-CM

## 2018-11-22 DIAGNOSIS — Z170 Estrogen receptor positive status [ER+]: Secondary | ICD-10-CM

## 2018-11-22 DIAGNOSIS — Z483 Aftercare following surgery for neoplasm: Secondary | ICD-10-CM

## 2018-11-22 DIAGNOSIS — I890 Lymphedema, not elsewhere classified: Secondary | ICD-10-CM

## 2018-11-22 MED ORDER — ZOLPIDEM TARTRATE 10 MG PO TABS: 10.0000 mg | ORAL_TABLET | Freq: Every evening | ORAL | 0 refills | Status: DC | PRN

## 2018-11-22 NOTE — Assessment & Plan Note (Signed)
06/19/2017:Left lumpectomy: IDC with DCIS, 2.1 cm, 0/9 lymph nodes negative margins negative; ER 95%, PR 95%, HER-2 negative ratio 1.32, Ki-67 20%, T2N0 stage Ib;  Right lumpectomy: IDC with DCIS 1.8 cm, focally involving lateral and anterior margins, 0/3 lymph nodes negative,reexcision anterior and lateral margins DCIS,ER 100%, PR 20%, HER-2 negative ratio 1.23, Ki-67 3%, T1CN0 stage I a  Oncotype DX recurrence score 17: Risk of recurrence 11% with tamoxifen alone There was not enough material to send Oncotype on the right breast. Adjuvant radiation therapy 08/13/2017-09/29/2017  Current treatment: Tamoxifen 20 mg daily originally started February 2019 Tamoxifen toxicities: 1.  Thinning of hair 2. Fatigue 3.  Insomnia: She would like to get a sleep study done.  I ordered a sleep study. 4.  Hot flashes: Effexor appears to be helping significantly.  Breast cancer surveillance: 1.  Complaining of left breast swelling: Clinical examination does not reveal any palpable abnormalities.  Recommended obtaining an urgent mammogram and ultrasound for further evaluation. 2.  Mammogram 03/22/2018: Benign breast density category C  Return to clinic as previously scheduled. 
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
## 2018-11-22 NOTE — Progress Notes (Signed)
Patient Care Team: Tower, Wynelle Fanny, MD as PCP - General (Family Medicine) Rolm Bookbinder, MD as Consulting Physician (General Surgery) Nicholas Lose, MD as Consulting Physician (Hematology and Oncology) Kyung Rudd, MD as Consulting Physician (Radiation Oncology) Delice Bison Charlestine Massed, NP as Nurse Practitioner (Hematology and Oncology)  DIAGNOSIS:  Encounter Diagnosis  Name Primary?  . Malignant neoplasm of upper-outer quadrant of left breast in female, estrogen receptor positive ([place])     SUMMARY OF ONCOLOGIC HISTORY:   Malignant neoplasm of upper-outer quadrant of left breast in female, estrogen receptor positive ([place])   05/13/2017 Initial Diagnosis    Palpable left breast mass with distortion at 1:30 position: 1.1 cm with left axillary lymph node, 3.5 mm cortex, biopsy tubular 05/21/2017. Biopsy of the breast mass revealed grade 1-2 IDC with DCIS ER 95%, PR 95%, Ki-67 20%, HER-2 negative ratio 1.32 T1c N0 stage IA    05/26/2017 Genetic Testing    Negative genetic testing on the 9-gene STAT panel.The STAT Breast cancer panel offered by [facility] includes sequencing and rearrangement analysis for the following 9 genes:  ATM, BRCA1, BRCA2, CDH1, CHEK2, PALB2, PTEN, STK11 and TP53.   The report date is 05/26/2017.  Negative genetic testing on the reflexed common hereditary cancer panel.  The Hereditary Gene Panel offered by [facility] includes sequencing and/or deletion duplication testing of the following 46 genes: APC, ATM, AXIN2, BARD1, BMPR1A, BRCA1, BRCA2, BRIP1, CDH1, CDKN2A (p14ARF), CDKN2A (p16INK4a), CHEK2, CTNNA1, DICER1, EPCAM (Deletion/duplication testing only), GREM1 (promoter region deletion/duplication testing only), KIT, MEN1, MLH1, MSH2, MSH3, MSH6, MUTYH, NBN, NF1, NHTL1, PALB2, PDGFRA, PMS2, POLD1, POLE, PTEN, RAD50, RAD51C, RAD51D, SDHB, SDHC, SDHD, SMAD4, SMARCA4. STK11, TP53, TSC1, TSC2, and VHL.  The following genes were evaluated for sequence changes only: SDHA and HOXB13  c.251G>A variant only.  The report date is May 26, 2017.    06/19/2017 Surgery    Left lumpectomy: IDC with DCIS, 2.1 cm, 0/9 lymph nodes negative margins negative; ER 95%, PR 95%, HER-2 negative ratio 1.32, Ki-67 20%, T2N0 stage Ib; Right lumpectomy: IDC with DCIS 1.8 cm, focally involving lateral and anterior margins, 0/3 lymph nodes negative, ER 100%, PR 20%, HER-2 negative ratio 1.23, Ki-67 3%, T1CN0 stage I a    06/19/2017 Oncotype testing    Oncotype DX score 17 and 14: Risk of recurrence 11%/9% with tamoxifen alone    07/07/2017 Surgery    Reexcision lateral margin: Residual DCIS intermediate grade, reexcision anterior margin: [facility]    08/13/2017 - 09/29/2017 Radiation Therapy    Adjuvant radiation therapy    10/2017 -  Anti-estrogen oral therapy    Tamoxifen daily    01/20/2018 Cancer Staging    Staging form: Breast, AJCC 8th Edition - Pathologic: Stage IA (pT2, pN0, cM0, G1, ER+, PR+, HER2-) - Signed by Gardenia Phlegm, NP on 01/20/2018     Malignant neoplasm of lower-outer quadrant of right breast of female, estrogen receptor positive ([place])   07/24/2017 Initial Diagnosis    Malignant neoplasm of lower-outer quadrant of right breast of female, estrogen receptor positive ([place])    01/20/2018 Cancer Staging    Staging form: Breast, AJCC 8th Edition - Pathologic: Stage IA (pT1c, pN0, cM0, G1, ER+, PR+, HER2-) - Signed by Gardenia Phlegm, NP on 01/20/2018     CHIEF COMPLIANT: Severe pain in bilateral hips, left breast swelling  INTERVAL HISTORY: Margaret Rojas is a 20-year with above-mentioned history of bilateral breast cancers who is currently on tamoxifen therapy.  She is taking it  for about a year and over the past 1 year she has had increasing pain in bilateral hips to the point that she is unable to exercise or walk.  She also had a issue with her toes for which she is now in a boot.  She has been working with physical therapy regarding a cord in the  axilla and over the past 1 month she has noticed slight swelling of her left breast.  She is here to check on the breast.  She is accompanied by her husband.  It appears that patient does not like taking tamoxifen.  REVIEW OF SYSTEMS:   Constitutional: Denies fevers, chills or abnormal weight loss Eyes: Denies blurriness of vision Ears, nose, mouth, throat, and face: Denies mucositis or sore throat Respiratory: Denies cough, dyspnea or wheezes Cardiovascular: Denies palpitation, chest discomfort Gastrointestinal:  Denies nausea, heartburn or change in bowel habits Skin: Denies abnormal skin rashes Lymphatics: Denies new lymphadenopathy or easy bruising Neurological:Denies numbness, tingling or new weaknesses Behavioral/Psych: Mood is stable, no new changes  Extremities: No lower extremity edema Breast: Left breast swelling All other systems were reviewed with the patient and are negative.  I have reviewed the past medical history, past surgical history, social history and family history with the patient and they are unchanged from previous note.  ALLERGIES:  is allergic to pneumovax [pneumococcal polysaccharide vaccine]; sulfonamide derivatives; and epinephrine.  MEDICATIONS:  Current Outpatient Medications  Medication Sig Dispense Refill  . acetaminophen (TYLENOL) 500 MG tablet Take 1,500 mg by mouth 3 (three) times daily as needed for mild pain.     Marland Kitchen ALPRAZolam (XANAX) 1 MG tablet Take 1 mg by mouth daily as needed for anxiety.    . cyclobenzaprine (FLEXERIL) 10 MG tablet Take 10 mg by mouth 2 (two) times daily as needed for muscle spasms (migraines).     . DULoxetine (CYMBALTA) 60 MG capsule Take 1 capsule (60 mg total) by mouth daily. 90 capsule 0  . eszopiclone (LUNESTA) 1 MG TABS tablet Take 1 tablet (1 mg total) by mouth at bedtime as needed for sleep. Take immediately before bedtime 30 tablet 0  . lidocaine (LIDODERM) 5 % lidocaine 5 % topical patch    . tamoxifen (NOLVADEX) 20  MG tablet Take 1 tablet (20 mg total) by mouth daily. 90 tablet 3  . tiZANidine (ZANAFLEX) 2 MG tablet Take 1-4 mg by mouth every 8 hours when necessary pain and spasm 30 tablet 0  . topiramate (TOPAMAX) 100 MG tablet Take 100 mg by mouth at bedtime.      No current facility-administered medications for this visit.     PHYSICAL EXAMINATION: ECOG PERFORMANCE STATUS: 2 - Symptomatic, <50% confined to bed  Vitals:   11/22/18 1340  BP: 111/75  Pulse: 78  Resp: 18  Temp: 99.2 F (37.3 C)  SpO2: 100%   Filed Weights   11/22/18 1340  Weight: 198 lb 12.8 oz (90.2 kg)    GENERAL:alert, no distress and comfortable SKIN: skin color, texture, turgor are normal, no rashes or significant lesions EYES: normal, Conjunctiva are pink and non-injected, sclera clear OROPHARYNX:no exudate, no erythema and lips, buccal mucosa, and tongue normal  NECK: supple, thyroid normal size, non-tender, without nodularity LYMPH:  no palpable lymphadenopathy in the cervical, axillary or inguinal LUNGS: clear to auscultation and percussion with normal breathing effort HEART: regular rate & rhythm and no murmurs and no lower extremity edema ABDOMEN:abdomen soft, non-tender and normal bowel sounds MUSCULOSKELETAL:no cyanosis of digits and no  clubbing  NEURO: alert & oriented x 3 with fluent speech, no focal motor/sensory deficits EXTREMITIES: No lower extremity edema BREAST: Swelling of the left breast is very mild.  I suspect this is mild in breast lymphedema.  (exam performed in the presence of a chaperone)  LABORATORY DATA:  I have reviewed the data as listed CMP Latest Ref Rng & Units 07/30/2018 01/29/2018 06/19/2017  Glucose 65 - 99 mg/dL 145(H) 93 75  BUN 7 - 25 mg/dL '15 21 13  '$ Creatinine 0.50 - 1.10 mg/dL 0.89 0.85 0.67  Sodium 135 - 146 mmol/L 143 140 135  Potassium 3.5 - 5.3 mmol/L 4.0 4.6 3.6  Chloride 98 - 110 mmol/L 110 110(H) 110  CO2 20 - 32 mmol/L 18(L) 24 22  Calcium 8.6 - 10.2 mg/dL 9.2 9.0  8.1(L)  Total Protein 6.1 - 8.1 g/dL 7.0 7.1 -  Total Bilirubin 0.2 - 1.2 mg/dL 0.2 0.2 -  Alkaline Phos 40 - 150 U/L - 46 -  AST 10 - 30 U/L 16 13 -  ALT 6 - 29 U/L 11 13 -    Lab Results  Component Value Date   WBC 5.8 07/30/2018   HGB 13.8 07/30/2018   HCT 40.7 07/30/2018   MCV 90.6 07/30/2018   PLT 246 07/30/2018   NEUTROABS 3,202 07/30/2018    ASSESSMENT & PLAN:  Malignant neoplasm of upper-outer quadrant of left breast in female, estrogen receptor positive ([place]) 06/19/2017:Left lumpectomy: IDC with DCIS, 2.1 cm, 0/9 lymph nodes negative margins negative; ER 95%, PR 95%, HER-2 negative ratio 1.32, Ki-67 20%, T2N0 stage Ib;  Right lumpectomy: IDC with DCIS 1.8 cm, focally involving lateral and anterior margins, 0/3 lymph nodes negative,reexcision anterior and lateral margins DCIS,ER 100%, PR 20%, HER-2 negative ratio 1.23, Ki-67 3%, T1CN0 stage I a  Oncotype DX recurrence score 17: Risk of recurrence 11% with tamoxifen alone There was not enough material to send Oncotype on the right breast. Adjuvant radiation therapy 08/13/2017-09/29/2017  Current treatment: Tamoxifen 20 mg daily originally started February 2019 Tamoxifen toxicities: 1.  Thinning of hair 2. Fatigue 3.  Insomnia: She would like to get a sleep study done.  I ordered a sleep study. 4.  Hot flashes: Effexor appears to be helping significantly. 5.  Bilateral hip pains: I instructed her to stop tamoxifen for up to a month and see if her symptoms improve.  If they do improve then she will go on half a tablet of tamoxifen daily. I will see her in 2 months to assess how she is doing on half a tablet daily.  Breast cancer surveillance: 1.  Complaining of left breast swelling: Clinical examination does not reveal any palpable abnormalities.  Recommended obtaining an urgent mammogram and ultrasound for further evaluation. 2.  Mammogram 03/22/2018: Benign breast density category C MRI of the breast December 2019:  Benign  Return to clinic in 2 months for follow-up.    No orders of the defined types were placed in this encounter.  The patient has a good understanding of the overall plan. she agrees with it. she will call with any problems that may develop before the next visit here.   Harriette Ohara, MD 11/22/18

## 2018-11-23 IMAGING — US US AXILLARY LEFT
1 series · 5 of 5 positions shown · non-contrast
Comparison: Mammography 06/19/2017 left), 06/18/2017 06/05/2017
(left), 05/13/2017 (left), 03/19/2017 (bilateral) and earlier.

CLINICAL DATA: 41-year-old who underwent malignant lumpectomy of
the upper outer quadrant of the left breast 06/19/2017, pathology
invasive ductal carcinoma, DCIS and lobular neoplasia, with negative
sentinel lymph nodes. Patient presents now with palpable fullness
and tenderness in the left axilla.

EXAM:
DIGITAL DIAGNOSTIC LEFT MAMMOGRAM WITH CAD AND TOMO
ULTRASOUND LEFT AXILLA

[Series 1: us axillary left · 0.04mm/px · 5 of 5 slices shown]
[im 1/5]
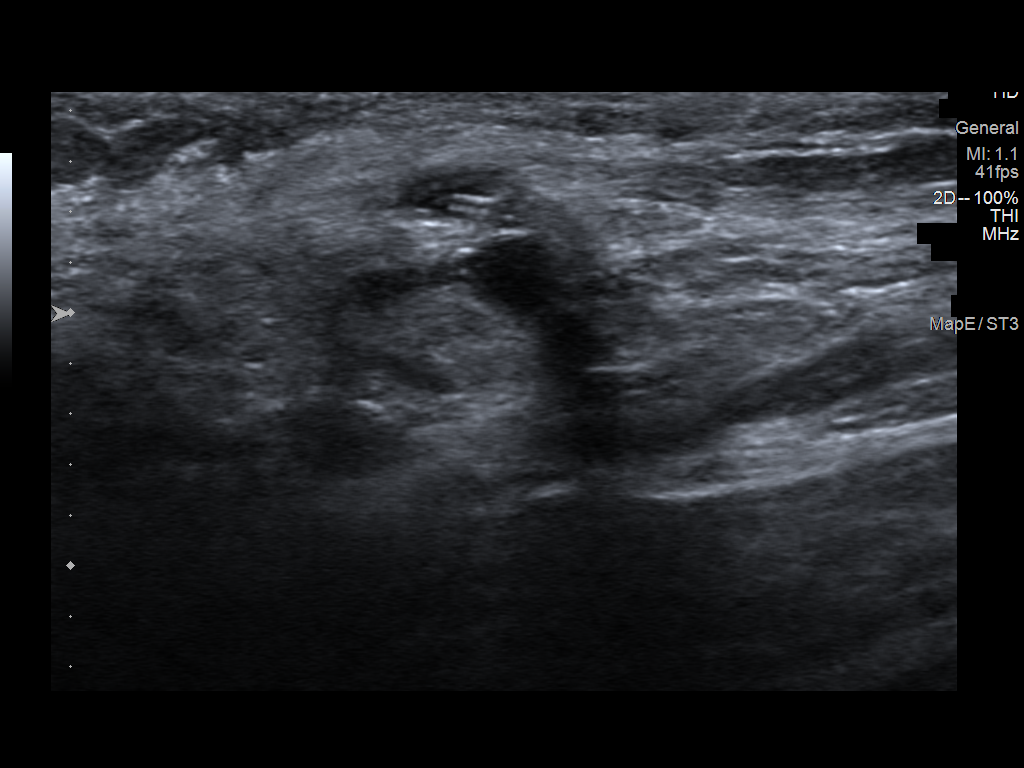
[im 2/5]
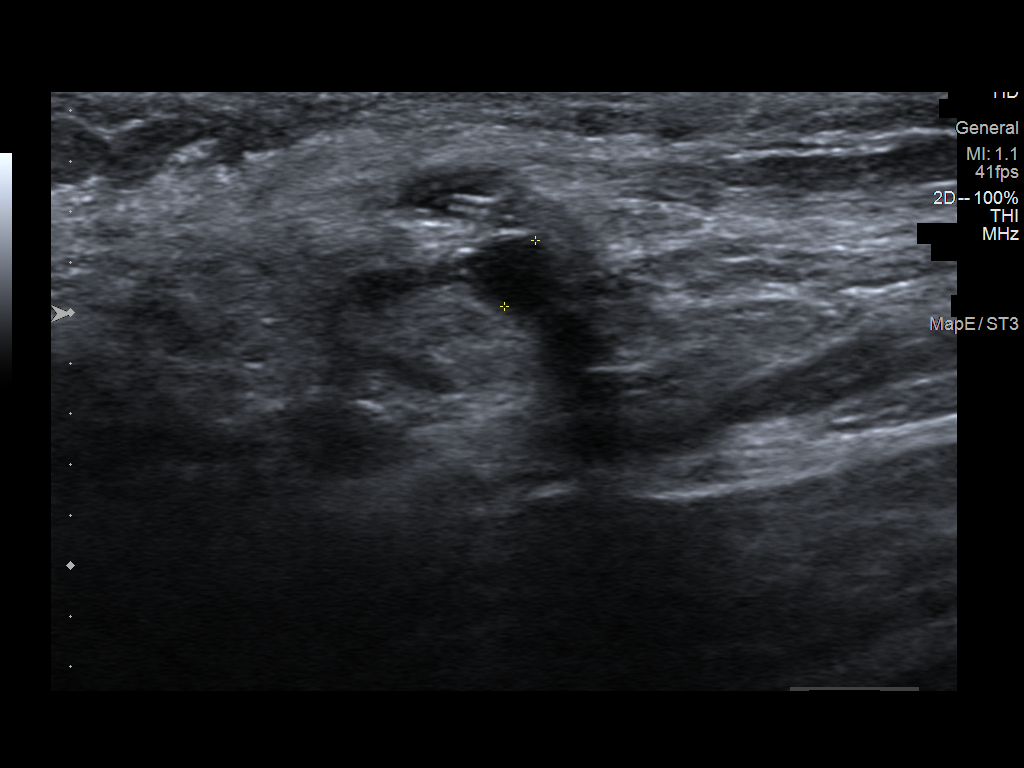
[im 3/5]
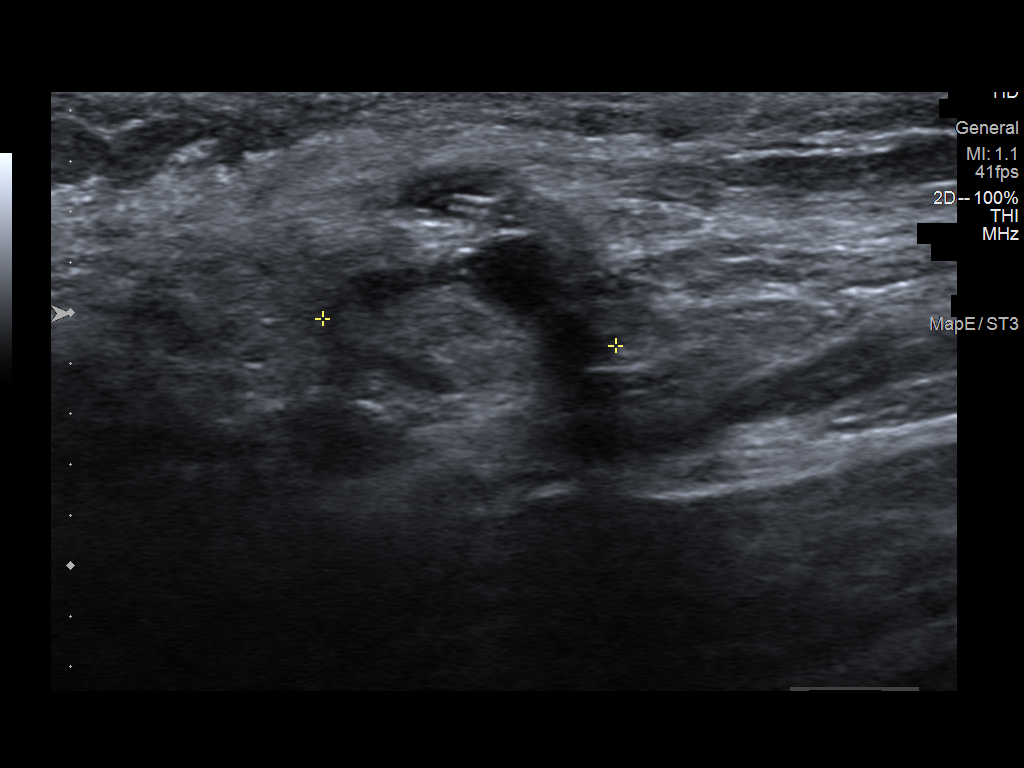
[im 4/5]
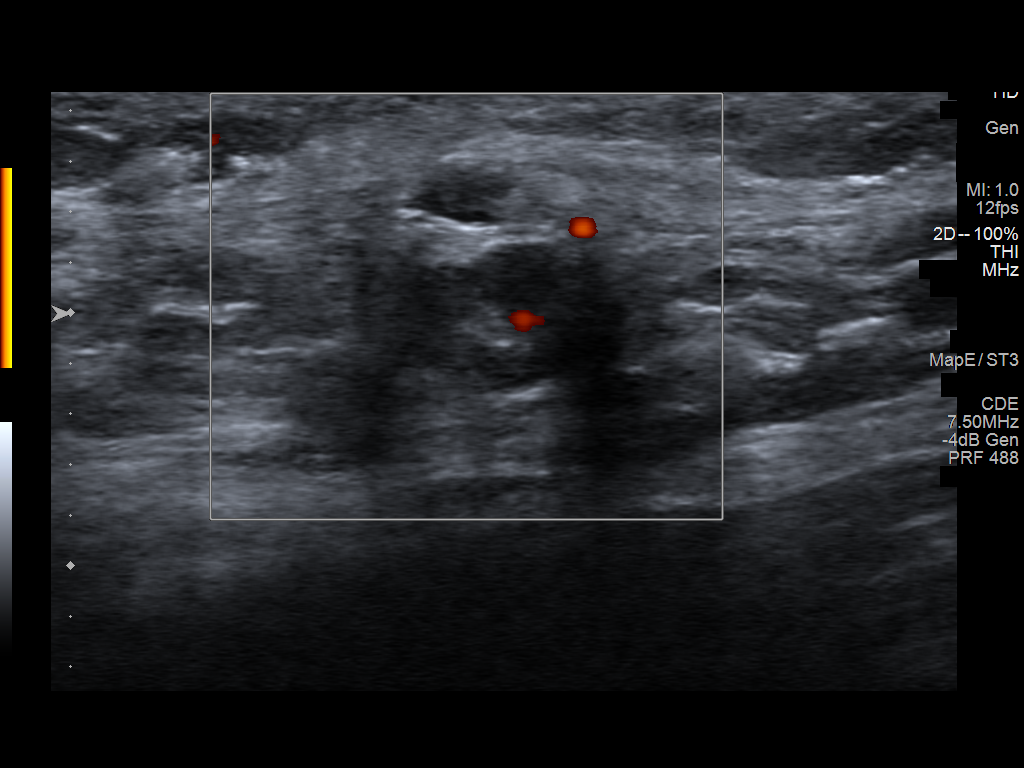
[im 5/5]
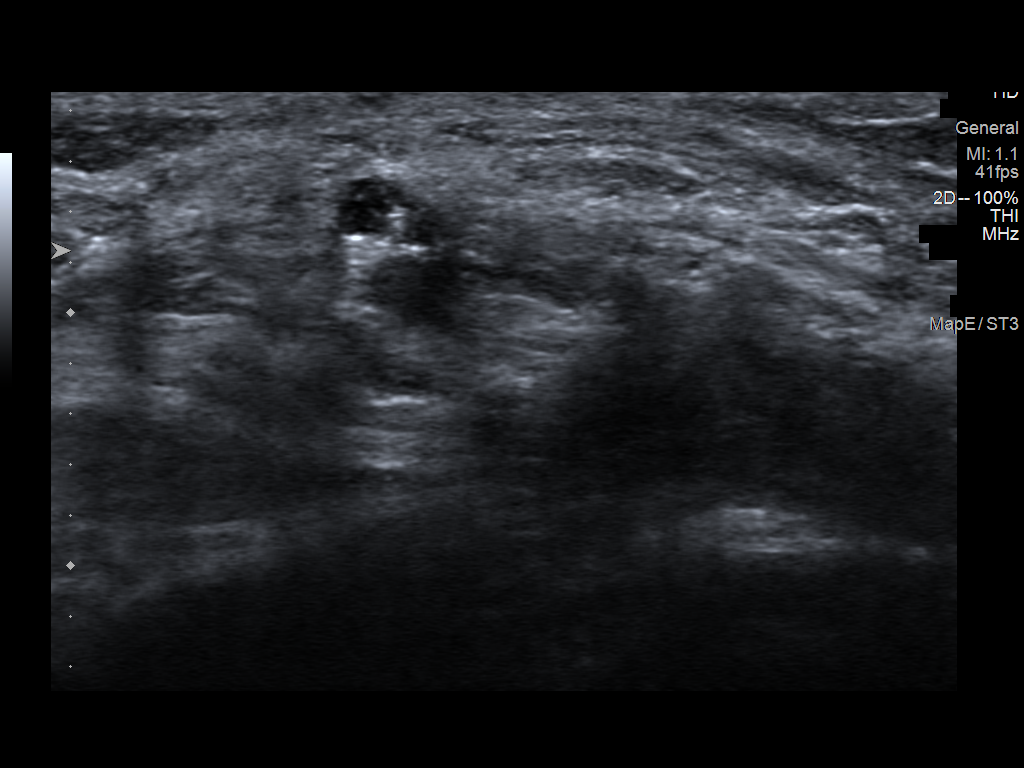

[5 of 5 positions shown; findings below may reference images not displayed]

Left
axillary ultrasound 05/13/2017, 03/19/2017.

ACR Breast Density Category c: The breast tissue is heterogeneously
dense, which may obscure small masses.
FINDINGS: Tomosynthesis and synthesized full field CC and MLO views of the
left breast were obtained. Tomosynthesis and synthesized spot
tangential view of the area of concern in the left axilla was also
obtained.

A normal appearing left axillary lymph node underlies the area of
palpable concern. This lymph node was previously biopsied and
contains a spiral shaped tissue marker clip, pathology revealing
reactive nodal tissue. Post radiation trabecular thickening is
present involving the left axilla. There is no mass or pathologic
lymphadenopathy in the visualized lower left axilla.

Post surgical scar/architectural distortion at the lumpectomy site
in the upper outer quadrant of the left breast at posterior depth.
Mild post radiation skin thickening throughout the left and
trabecular thickening breast. No findings suspicious for malignancy
in the left breast.

Mammographic images were processed with CAD.

On physical exam, there is a palpable ridge of tissue in the low
left axilla corresponding to what the patient is feeling. There is
no palpable left axillary lymphadenopathy.

Targeted left axillary ultrasound is performed, showing scattered
normal appearing lymph nodes with maximum cortical thickness of 3
mm. The previously biopsied node with the [employer] tissue marker
clip is identified. There is no pathologic left axillary
lymphadenopathy.
IMPRESSION: 1. No mammographic evidence of malignancy involving the left breast.
2. No pathologic left axillary lymphadenopathy.
3. Expected post lumpectomy changes involving the upper outer
quadrant of the left breast.
4. Expected post radiation changes involving the left breast and the
left axilla.

RECOMMENDATION:
Bilateral diagnostic mammography which is due in March 2018.

I have discussed the findings and recommendations with the patient.
Results were also provided in writing at the conclusion of the
visit. If applicable, a reminder letter will be sent to the patient
regarding the next appointment.

BI-RADS CATEGORY  2: Benign.

## 2018-11-29 ENCOUNTER — Other Ambulatory Visit (HOSPITAL_COMMUNITY): Payer: Self-pay

## 2018-11-29 ENCOUNTER — Other Ambulatory Visit (HOSPITAL_COMMUNITY): Payer: Self-pay | Admitting: Psychiatry

## 2018-11-29 ENCOUNTER — Ambulatory Visit: Payer: 59 | Admitting: Rehabilitation

## 2018-11-29 DIAGNOSIS — F411 Generalized anxiety disorder: Secondary | ICD-10-CM

## 2018-11-29 DIAGNOSIS — F330 Major depressive disorder, recurrent, mild: Secondary | ICD-10-CM

## 2018-11-29 MED ORDER — DULOXETINE HCL 60 MG PO CPEP: 60.0000 mg | ORAL_CAPSULE | Freq: Every day | ORAL | 0 refills | Status: DC

## 2018-12-02 ENCOUNTER — Other Ambulatory Visit: Payer: Self-pay | Admitting: Family Medicine

## 2018-12-02 NOTE — Telephone Encounter (Signed)
Please verify/rev med list with her since it has not been filled in a while and she was seeing psychiatry  Let me know

## 2018-12-02 NOTE — Telephone Encounter (Signed)
Last time I can tell it was prescribed was July 2018, please advise

## 2018-12-03 ENCOUNTER — Ambulatory Visit
Admission: RE | Admit: 2018-12-03 | Discharge: 2018-12-03 | Disposition: A | Discharge status: Home / Self Care | Payer: 59 | Source: Ambulatory Visit | Attending: Hematology and Oncology | Admitting: Hematology and Oncology

## 2018-12-03 ENCOUNTER — Other Ambulatory Visit: Payer: Self-pay

## 2018-12-03 DIAGNOSIS — Z170 Estrogen receptor positive status [ER+]: Principal | ICD-10-CM

## 2018-12-03 DIAGNOSIS — C50412 Malignant neoplasm of upper-outer quadrant of left female breast: Secondary | ICD-10-CM

## 2018-12-03 NOTE — Telephone Encounter (Signed)
Please have her call her psychiatrist to see if they will fill w/o a visit (now that most offices are limiting visits anyway)- I suspect they will refill it  Who is her  Psychiatrist ? It really needs to stay with the same provider (since this med is a controlled sub and refills are watched by the state)   Let me know how it goes

## 2018-12-03 NOTE — Telephone Encounter (Signed)
Pt said she hasn't been able to get in with psychiatry so she is requesting Dr. Glori Bickers fill med since she is the PCP, pt said with everything going on in the world her anxiety is severe and she can't get through to her psychiatrist

## 2018-12-03 NOTE — Telephone Encounter (Signed)
Left VM requesting pt to call the office back 

## 2018-12-03 NOTE — Telephone Encounter (Signed)
Pt returned your call  Best number [phone]

## 2018-12-06 NOTE — Telephone Encounter (Signed)
Pt said her psychiatrist never prescribed it she said that she still had a few from the last time you filled it in 2018, pt said that the last time she saw her psychiatrist he mentioned that he would fill it if she needed it at the [facility] but she still had some left from your refill so she declined. Pt said now that it would be a new med psychiatry will not fill it without an OV and she isn't sure when she can get in to see them with the Covid-19 right now, pt is just asking you to refill it until she can get in since you were the last prescriber

## 2018-12-06 NOTE — Telephone Encounter (Signed)
Use sparingly for emergencies only  Send copy of our phone note chain to her psychiatrist please  Eventually they should take this over if she needs it ongoing (hopefully she will not)   I sent it

## 2018-12-07 ENCOUNTER — Other Ambulatory Visit (HOSPITAL_COMMUNITY): Payer: Self-pay | Admitting: Psychiatry

## 2018-12-07 DIAGNOSIS — F411 Generalized anxiety disorder: Secondary | ICD-10-CM

## 2018-12-07 DIAGNOSIS — F330 Major depressive disorder, recurrent, mild: Secondary | ICD-10-CM

## 2018-12-07 MED ORDER — DULOXETINE HCL 60 MG PO CPEP: 60.0000 mg | ORAL_CAPSULE | Freq: Every day | ORAL | 0 refills | Status: DC

## 2018-12-07 NOTE — Telephone Encounter (Signed)
Left VM letting pt know Dr. Marliss Coots comments. Phone note routed to psychiatrist

## 2018-12-14 ENCOUNTER — Other Ambulatory Visit: Payer: Self-pay

## 2018-12-14 ENCOUNTER — Ambulatory Visit (INDEPENDENT_AMBULATORY_CARE_PROVIDER_SITE_OTHER): Payer: Self-pay | Admitting: Psychiatry

## 2018-12-14 DIAGNOSIS — F330 Major depressive disorder, recurrent, mild: Secondary | ICD-10-CM

## 2018-12-14 DIAGNOSIS — F411 Generalized anxiety disorder: Secondary | ICD-10-CM

## 2018-12-14 MED ORDER — DULOXETINE HCL 60 MG PO CPEP: 60.0000 mg | ORAL_CAPSULE | Freq: Every day | ORAL | 0 refills | Status: DC

## 2018-12-14 MED ORDER — CLONAZEPAM 0.5 MG PO TABS: 0.5000 mg | ORAL_TABLET | Freq: Every day | ORAL | 0 refills | Status: DC | PRN

## 2018-12-14 NOTE — Progress Notes (Signed)
Virtual Visit via Telephone Note  I connected with Margaret Rojas on 12/14/18 at  8:40 AM EDT by telephone and verified that I am speaking with the correct person using two identifiers.   I discussed the limitations, risks, security and privacy concerns of performing an evaluation and management service by telephone and the availability of in person appointments. I also discussed with the patient that there may be a patient responsible charge related to this service. The patient expressed understanding and agreed to proceed.   History of Present Illness: Patient was evaluated through phone session.  She is experiencing increased anxiety and nervousness due to pandemic coronavirus.  She is not leaving the house.  Recently she seen her oncologist to discuss joint pain related to tamoxifen and she was told to take a break for at least a month.  She noticed some improvement in her joint pain but is still complaining hip joint pain.  She tried [place] but did not like metallic taste and did not help her sleep.  She is back on Ambien which is prescribed by physician recently.  She is also given Xanax which she is taking to help her anxiety which is recently increased due to environmental situation.  She lives with her children and her husband.  Patient told sometimes it is difficult because everyone is working from home.  She is not drinking or using any illegal substances.  She feels Cymbalta is helping and she denies any major panic attack.  She denies any crying spells or any feeling of hopelessness or worthlessness.  She reported no tremors or any shakes.  She reported her energy level is fair.    Past Psychiatric History: Reviewed. History of depression since 2005.  Took Lexapro when she was pregnant and last November took Zoloft but switched to Effexor when he diagnosed with breast cancer.  We switched from Effexor to Cymbalta to have a better control on her chronic pain.  No history of suicidal  attempt or inpatient psychiatric treatment.  Mental status examination: Limited mental status examination done on the phone.  Patient reported anxiety.  She denies any auditory or visual hallucination.  She denies any active or passive suicidal thoughts or homicidal thought.  She denies any highs and lows in her mood.  Her speech appears to be fluent, clear, coherent and there were no flight of ideas or any loose association.  There were no delusions or any grandiosity.  She does not appear to be distracted on the phone.  She is alert and oriented x3.  Her cognition is intact.  Her fund of knowledge is adequate.  Her insight and judgment is okay.  Assessment and Plan: Major depressive disorder, recurrent.  Generalized anxiety disorder.  Discussed Xanax shorter half-life and risk of dependency.  Recommended to try Klonopin 0.5 mg to help the anxiety and nervousness.  Discussed taking Ambien and benzodiazepine side effects, potential abuse but patient reported that she need benzodiazepine to help her anxiety due to current situation.  She like Cymbalta 60 mg is helping overall her depression.  She reported no tremors or shakes.  She is not drinking.  I will continue Cymbalta 60 mg daily.  Discontinue Xanax and recommended to try Klonopin 0.5 mg as needed for anxiety.  She is also getting Ambien from her primary care physician.  Discussed medication side effects and benefits.  Recommended to call us back if is any question or any concern.  Follow-up in 3 months.  Follow Up Instructions:  I discussed the assessment and treatment plan with the patient. The patient was provided an opportunity to ask questions and all were answered. The patient agreed with the plan and demonstrated an understanding of the instructions.   The patient was advised to call back or seek an in-person evaluation if the symptoms worsen or if the condition fails to improve as anticipated.  I provided 15 minutes of  non-face-to-face time during this encounter.   Kathlee Nations, MD

## 2018-12-15 ENCOUNTER — Encounter: Payer: Self-pay | Admitting: Rehabilitation

## 2018-12-15 ENCOUNTER — Telehealth: Payer: Self-pay | Admitting: Rehabilitation

## 2018-12-15 NOTE — Telephone Encounter (Signed)
PT check in with Pt due to Covid-10 closure.  Pt reports that she is doing well at home with compression and self MLD but will want to start coming back to the clinic when all of this settles down a bit.  Interested in a pump.  Aware that Pts can be seen at the clinic if needed right now.

## 2018-12-22 ENCOUNTER — Ambulatory Visit: Payer: 59 | Admitting: Rehabilitation

## 2019-01-13 ENCOUNTER — Ambulatory Visit: Payer: 59 | Attending: Hematology and Oncology | Admitting: Rehabilitation

## 2019-01-13 ENCOUNTER — Encounter: Payer: Self-pay | Admitting: Rehabilitation

## 2019-01-13 ENCOUNTER — Other Ambulatory Visit: Payer: Self-pay

## 2019-01-13 DIAGNOSIS — I890 Lymphedema, not elsewhere classified: Secondary | ICD-10-CM | POA: Diagnosis present

## 2019-01-13 DIAGNOSIS — R293 Abnormal posture: Secondary | ICD-10-CM | POA: Insufficient documentation

## 2019-01-13 DIAGNOSIS — Z483 Aftercare following surgery for neoplasm: Secondary | ICD-10-CM | POA: Insufficient documentation
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
## 2019-01-18 ENCOUNTER — Telehealth: Payer: Self-pay | Admitting: Hematology and Oncology

## 2019-01-18 NOTE — Telephone Encounter (Signed)
Spoke with patient and she has agreed to [employer].  Emailed step-by-step instructions how download and access mtg.  She will call with any questions.

## 2019-01-18 NOTE — Assessment & Plan Note (Signed)
06/19/2017:Left lumpectomy: IDC with DCIS, 2.1 cm, 0/9 lymph nodes negative margins negative; ER 95%, PR 95%, HER-2 negative ratio 1.32, Ki-67 20%, T2N0 stage Ib;  Right lumpectomy: IDC with DCIS 1.8 cm, focally involving lateral and anterior margins, 0/3 lymph nodes negative,reexcision anterior and lateral margins DCIS,ER 100%, PR 20%, HER-2 negative ratio 1.23, Ki-67 3%, T1CN0 stage I a  Oncotype DX recurrence score 17: Risk of recurrence 11% with tamoxifen alone There was not enough material to send Oncotype on the right breast. Adjuvant radiation therapy 08/13/2017-09/29/2017  Current treatment:Tamoxifen 20 mg daily originally started February 2019 Tamoxifen toxicities: 1.Thinning of hair 2.Fatigue 3.Insomnia: She would like to get a sleep study done. I ordered a sleep study. 4.Hot flashes: Effexor appears to be helping significantly. 5.  Bilateral hip pains: I instructed her to stop tamoxifen for up to a month and see if her symptoms improve.  If they do improve then she will go on half a tablet of tamoxifen daily.   Breast cancer surveillance: Mammogram 03/22/2018: Benign breast density category C MRI of the breast December 2019: Benign  Return to clinic in 1 year for follow-up.

## 2019-01-20 NOTE — Progress Notes (Signed)
HEMATOLOGY-ONCOLOGY [place] VISIT PROGRESS NOTE  I connected with Margaret Rojas on 01/21/2019 at  8:30 AM EDT by Webex video conference and verified that I am speaking with the correct person using two identifiers.  I discussed the limitations, risks, security and privacy concerns of performing an evaluation and management service by Webex and the availability of in person appointments.  I also discussed with the patient that there may be a patient responsible charge related to this service. The patient expressed understanding and agreed to proceed.  Patient's Location: Home Physician Location: Clinic  CHIEF COMPLIANT: Follow-up of tamoxifen therapy  INTERVAL HISTORY: Margaret Rojas is a 43 y.o. female with above-mentioned history of bilateral breast cancers treated with lumpectomies and re-excision, adjuvant radiation, and who is currently on tamoxifen therapy. On 11/22/18 she stopped tamoxifen for a month to see if her symptoms of fatigue, hot flashes and myalgias improved. She presents today over Webex for follow-up to decide about further tamoxifen therapy.  When she came off tamoxifen she felt amazing with no side effects or symptoms.  She restarted tamoxifen at 10 mg and slowly starting to see some of the aches and pains return.  She is also having profound night sweats.  She does not have hot flashes per se.    Malignant neoplasm of upper-outer quadrant of left breast in female, estrogen receptor positive ([place])   05/13/2017 Initial Diagnosis    Palpable left breast mass with distortion at 1:30 position: 1.1 cm with left axillary lymph node, 3.5 mm cortex, biopsy tubular 05/21/2017. Biopsy of the breast mass revealed grade 1-2 IDC with DCIS ER 95%, PR 95%, Ki-67 20%, HER-2 negative ratio 1.32 T1c N0 stage IA    05/26/2017 Genetic Testing    Negative genetic testing on the 9-gene STAT panel.The STAT Breast cancer panel offered by [facility] includes sequencing and rearrangement  analysis for the following 9 genes:  ATM, BRCA1, BRCA2, CDH1, CHEK2, PALB2, PTEN, STK11 and TP53.   The report date is 05/26/2017.  Negative genetic testing on the reflexed common hereditary cancer panel.  The Hereditary Gene Panel offered by [facility] includes sequencing and/or deletion duplication testing of the following 46 genes: APC, ATM, AXIN2, BARD1, BMPR1A, BRCA1, BRCA2, BRIP1, CDH1, CDKN2A (p14ARF), CDKN2A (p16INK4a), CHEK2, CTNNA1, DICER1, EPCAM (Deletion/duplication testing only), GREM1 (promoter region deletion/duplication testing only), KIT, MEN1, MLH1, MSH2, MSH3, MSH6, MUTYH, NBN, NF1, NHTL1, PALB2, PDGFRA, PMS2, POLD1, POLE, PTEN, RAD50, RAD51C, RAD51D, SDHB, SDHC, SDHD, SMAD4, SMARCA4. STK11, TP53, TSC1, TSC2, and VHL.  The following genes were evaluated for sequence changes only: SDHA and HOXB13 c.251G>A variant only.  The report date is May 26, 2017.    06/19/2017 Surgery    Left lumpectomy: IDC with DCIS, 2.1 cm, 0/9 lymph nodes negative margins negative; ER 95%, PR 95%, HER-2 negative ratio 1.32, Ki-67 20%, T2N0 stage Ib; Right lumpectomy: IDC with DCIS 1.8 cm, focally involving lateral and anterior margins, 0/3 lymph nodes negative, ER 100%, PR 20%, HER-2 negative ratio 1.23, Ki-67 3%, T1CN0 stage I a    06/19/2017 Oncotype testing    Oncotype DX score 17 and 14: Risk of recurrence 11%/9% with tamoxifen alone    07/07/2017 Surgery    Reexcision lateral margin: Residual DCIS intermediate grade, reexcision anterior margin: [facility]    08/13/2017 - 09/29/2017 Radiation Therapy    Adjuvant radiation therapy    10/2017 -  Anti-estrogen oral therapy    Tamoxifen daily    01/20/2018 Cancer Staging    Staging form: Breast,  AJCC 8th Edition - Pathologic: Stage IA (pT2, pN0, cM0, G1, ER+, PR+, HER2-) - Signed by Gardenia Phlegm, NP on 01/20/2018     Malignant neoplasm of lower-outer quadrant of right breast of female, estrogen receptor positive ([place])   07/24/2017 Initial Diagnosis     Malignant neoplasm of lower-outer quadrant of right breast of female, estrogen receptor positive ([place])    01/20/2018 Cancer Staging    Staging form: Breast, AJCC 8th Edition - Pathologic: Stage IA (pT1c, pN0, cM0, G1, ER+, PR+, HER2-) - Signed by Gardenia Phlegm, NP on 01/20/2018     REVIEW OF SYSTEMS:   Constitutional: Denies fevers, chills or abnormal weight loss Eyes: Denies blurriness of vision Ears, nose, mouth, throat, and face: Denies mucositis or sore throat Respiratory: Shortness of breath exertion Cardiovascular: Denies palpitation, chest discomfort Gastrointestinal:  Denies nausea, heartburn or change in bowel habits Skin: Denies abnormal skin rashes Lymphatics: Denies new lymphadenopathy or easy bruising Neurological:Denies numbness, tingling or new weaknesses Behavioral/Psych: Mood is stable, no new changes  Extremities: Muscle aches and pains, no lower extremity edema Breast: denies any pain or lumps or nodules in either breasts All other systems were reviewed with the patient and are negative.  Observations/Objective:  There were no vitals filed for this visit. There is no height or weight on file to calculate BMI.  I have reviewed the data as listed CMP Latest Ref Rng & Units 07/30/2018 01/29/2018 06/19/2017  Glucose 65 - 99 mg/dL 145(H) 93 75  BUN 7 - 25 mg/dL _0 Creatinine 0.50 - 1.10 mg/dL 0.89 0.85 0.67  Sodium 135 - 146 mmol/L 143 140 135  Potassium 3.5 - 5.3 mmol/L 4.0 4.6 3.6  Chloride 98 - 110 mmol/L 110 110(H) 110  CO2 20 - 32 mmol/L 18(L) 24 22  Calcium 8.6 - 10.2 mg/dL 9.2 9.0 8.1(L)  Total Protein 6.1 - 8.1 g/dL 7.0 7.1 -  Total Bilirubin 0.2 - 1.2 mg/dL 0.2 0.2 -  Alkaline Phos 40 - 150 U/L - 46 -  AST 10 - 30 U/L 16 13 -  ALT 6 - 29 U/L 11 13 -    Lab Results  Component Value Date   WBC 5.8 07/30/2018   HGB 13.8 07/30/2018   HCT 40.7 07/30/2018   MCV 90.6 07/30/2018   PLT 246 07/30/2018   NEUTROABS 3,202 07/30/2018       Assessment Plan:  Malignant neoplasm of upper-outer quadrant of left breast in female, estrogen receptor positive ([place]) 06/19/2017:Left lumpectomy: IDC with DCIS, 2.1 cm, 0/9 lymph nodes negative margins negative; ER 95%, PR 95%, HER-2 negative ratio 1.32, Ki-67 20%, T2N0 stage Ib;  Right lumpectomy: IDC with DCIS 1.8 cm, focally involving lateral and anterior margins, 0/3 lymph nodes negative,reexcision anterior and lateral margins DCIS,ER 100%, PR 20%, HER-2 negative ratio 1.23, Ki-67 3%, T1CN0 stage I a  Oncotype DX recurrence score 17: Risk of recurrence 11% with tamoxifen alone There was not enough material to send Oncotype on the right breast. Adjuvant radiation therapy 08/13/2017-09/29/2017  Current treatment:Tamoxifen 20 mg daily originally started February 2019 off for 6 weeks restarted April 2020 at 10 mg  Tamoxifen toxicities: 1.Thinning of hair 2.Fatigue 3.Insomnia: She would like to get a sleep study done. I ordered a sleep study. 4.Hot flashes: Effexor appears to be helping significantly. 5.  Bilateral hip pains: better at 10 mg 6. SOB: To exertion since she started tamoxifen.  We discussed about cutting the dose down to 5 mg.  At this time she would like to keep the dosage at the same 10 mg and if she cannot tolerate it she will call us back and we will send a prescription for 10 mg dose that she can break it in half.   Breast cancer surveillance: Mammogram 03/22/2018: Benign breast density category C MRI of the breast December 2019: Benign  Return to clinic in 6 months for follow-up after that we can see her if we need to see her annually.    I discussed the assessment and treatment plan with the patient. The patient was provided an opportunity to ask questions and all were answered. The patient agreed with the plan and demonstrated an understanding of the instructions. The patient was advised to call back or seek an in-person evaluation if the symptoms  worsen or if the condition fails to improve as anticipated.   I provided 15 minutes of face-to-face Web Ex time during this encounter.    Rulon Eisenmenger, MD 01/21/2019   I, Molly Dorshimer, am acting as scribe for Nicholas Lose, MD.  I have reviewed the above documentation for accuracy and completeness, and I agree with the above.

## 2019-01-21 ENCOUNTER — Inpatient Hospital Stay: Payer: 59 | Attending: Hematology and Oncology | Admitting: Hematology and Oncology

## 2019-01-21 DIAGNOSIS — Z923 Personal history of irradiation: Secondary | ICD-10-CM | POA: Diagnosis not present

## 2019-01-21 DIAGNOSIS — Z170 Estrogen receptor positive status [ER+]: Secondary | ICD-10-CM | POA: Diagnosis not present

## 2019-01-21 DIAGNOSIS — Z1231 Encounter for screening mammogram for malignant neoplasm of breast: Secondary | ICD-10-CM

## 2019-01-21 DIAGNOSIS — C50412 Malignant neoplasm of upper-outer quadrant of left female breast: Secondary | ICD-10-CM

## 2019-01-21 MED ORDER — TAMOXIFEN CITRATE 20 MG PO TABS: 10.0000 mg | ORAL_TABLET | Freq: Every day | ORAL | 3 refills | Status: DC

## 2019-02-08 ENCOUNTER — Other Ambulatory Visit: Payer: Self-pay | Admitting: Family Medicine

## 2019-02-09 NOTE — Telephone Encounter (Signed)
I rev more recent psychiatry records from march- Dr Dossie Der is aware she is still taking it from [place]  Thanks

## 2019-02-09 NOTE — Telephone Encounter (Signed)
Copy of State database records in your inbox. You last filled med on 06/18/18 but then Dr. Dossie Der d/c med on 08/23/18, then pt's oncologist added med back to med list (historical) but didn't refill it, she just added med back to list, not sure if pt's suppose to be on med or not. Please review

## 2019-02-18 ENCOUNTER — Other Ambulatory Visit (HOSPITAL_COMMUNITY): Payer: Self-pay | Admitting: Psychiatry

## 2019-02-18 DIAGNOSIS — F411 Generalized anxiety disorder: Secondary | ICD-10-CM

## 2019-02-18 DIAGNOSIS — F330 Major depressive disorder, recurrent, mild: Secondary | ICD-10-CM

## 2019-03-15 ENCOUNTER — Encounter (HOSPITAL_COMMUNITY): Payer: Self-pay | Admitting: Psychiatry

## 2019-03-15 ENCOUNTER — Ambulatory Visit (INDEPENDENT_AMBULATORY_CARE_PROVIDER_SITE_OTHER): Payer: 59 | Admitting: Psychiatry

## 2019-03-15 ENCOUNTER — Other Ambulatory Visit: Payer: Self-pay

## 2019-03-15 DIAGNOSIS — F330 Major depressive disorder, recurrent, mild: Secondary | ICD-10-CM | POA: Diagnosis not present

## 2019-03-15 DIAGNOSIS — F411 Generalized anxiety disorder: Secondary | ICD-10-CM

## 2019-03-15 MED ORDER — DULOXETINE HCL 60 MG PO CPEP: 60.0000 mg | ORAL_CAPSULE | Freq: Every day | ORAL | 0 refills | Status: DC

## 2019-03-15 MED ORDER — CLONAZEPAM 0.5 MG PO TABS: 0.5000 mg | ORAL_TABLET | Freq: Every day | ORAL | 0 refills | Status: DC | PRN

## 2019-03-15 NOTE — Progress Notes (Signed)
Virtual Visit via Telephone Note  I connected with Margaret Rojas on 03/15/19 at  8:40 AM EDT by telephone and verified that I am speaking with the correct person using two identifiers.   I discussed the limitations, risks, security and privacy concerns of performing an evaluation and management service by telephone and the availability of in person appointments. I also discussed with the patient that there may be a patient responsible charge related to this service. The patient expressed understanding and agreed to proceed.   History of Present Illness: Patient was evaluated by phone session.  She is doing better on Cymbalta.  She takes rarely Klonopin which she likes to help her anxiety.  She is taking Ambien prescribed by other provider for her insomnia.  Overall she is feeling much better.  She started going few days to work and admitted in the building she was overwhelming but now she is handling situation much better.  She is sleeping good.  She denies any panic attack.  She denies any crying spells or any feeling of hopelessness or worthlessness.  Recently she saw her oncologist who reduced her tamoxifen to only 10 mg and that helps her a lot.  She has no longer joint pain.  Her husband is still working from home.  Her husband works at [employer] and very busy working from home.  Her kids 53 and 67 year old recently finished school.  Patient denies any feeling of hopelessness or worthlessness.  She wants to continue Cymbalta since it is helping her anxiety and depression.  Her appetite is okay.  Her energy level is good.  She denies drinking or using any illegal substances.   Past Psychiatric History:Reviewed. History of depression since 2005. Took Lexapro while pregnant, Zoloft until switched to Effexor in November 43 when diagnosed with breast cancer. We switched to Cymbalta for better control on her chronic pain. Tried Xanax and Lunesta (d/c metallic taste). No history of suicidal  attempt or inpatient psychiatric treatment.   Psychiatric Specialty Exam: Physical Exam  ROS  There were no vitals taken for this visit.There is no height or weight on file to calculate BMI.  General Appearance: NA  Eye Contact:  NA  Speech:  Clear and Coherent  Volume:  Normal  Mood:  Euthymic  Affect:  NA  Thought Process:  Coherent and Goal Directed  Orientation:  Full (Time, Place, and Person)  Thought Content:  WDL and Logical  Suicidal Thoughts:  No  Homicidal Thoughts:  No  Memory:  Immediate;   Good Recent;   Good Remote;   Good  Judgement:  Good  Insight:  Good  Psychomotor Activity:  NA  Concentration:  Concentration: Good and Attention Span: Good  Recall:  Good  Fund of Knowledge:  Good  Language:  Good  Akathisia:  No  Handed:  Right  AIMS (if indicated):     Assets:  Communication Skills Desire for Improvement Housing Resilience Social Support  ADL's:  Intact  Cognition:  WNL  Sleep:   good    Assessment and Plan: Major depressive disorder, recurrent.  Generalized anxiety disorder.  Patient doing better since we switched to Klonopin.  She takes rarely when she was very nervous and anxious.  Overall she noticed much improvement in her depression anxiety.  Continue Cymbalta 60 mg daily, continue Klonopin 0.5 mg for severe anxiety.  She is getting Ambien from her primary care physician.  Discussed medication side effects and benefits.  Recommend to call us back if she  is any question or any concern.  Follow-up in 3 months.  Follow Up Instructions:    I discussed the assessment and treatment plan with the patient. The patient was provided an opportunity to ask questions and all were answered. The patient agreed with the plan and demonstrated an understanding of the instructions.   The patient was advised to call back or seek an in-person evaluation if the symptoms worsen or if the condition fails to improve as anticipated.  I provided 15 minutes of  non-face-to-face time during this encounter.   Kathlee Nations, MD

## 2019-03-31 ENCOUNTER — Telehealth: Payer: Self-pay | Admitting: Hematology and Oncology

## 2019-03-31 NOTE — Telephone Encounter (Signed)
[facility] 8/19 moved f/u to PM. Confirmed with patient.

## 2019-04-13 ENCOUNTER — Other Ambulatory Visit: Payer: Self-pay | Admitting: Hematology and Oncology

## 2019-04-13 DIAGNOSIS — Z853 Personal history of malignant neoplasm of breast: Secondary | ICD-10-CM

## 2019-04-20 ENCOUNTER — Other Ambulatory Visit: Payer: Self-pay

## 2019-04-20 ENCOUNTER — Ambulatory Visit
Admission: RE | Admit: 2019-04-20 | Discharge: 2019-04-20 | Disposition: A | Discharge status: Home / Self Care | Payer: 59 | Source: Ambulatory Visit | Attending: Hematology and Oncology | Admitting: Hematology and Oncology

## 2019-04-20 DIAGNOSIS — Z853 Personal history of malignant neoplasm of breast: Secondary | ICD-10-CM

## 2019-04-27 ENCOUNTER — Telehealth: Payer: Self-pay | Admitting: Hematology and Oncology

## 2019-04-27 NOTE — Telephone Encounter (Signed)
I talk with patient regarding video visit °

## 2019-04-27 NOTE — Assessment & Plan Note (Signed)
06/19/2017:Left lumpectomy: IDC with DCIS, 2.1 cm, 0/9 lymph nodes negative margins negative; ER 95%, PR 95%, HER-2 negative ratio 1.32, Ki-67 20%, T2N0 stage Ib;  Right lumpectomy: IDC with DCIS 1.8 cm, focally involving lateral and anterior margins, 0/3 lymph nodes negative,reexcision anterior and lateral margins DCIS,ER 100%, PR 20%, HER-2 negative ratio 1.23, Ki-67 3%, T1CN0 stage I a  Oncotype DX recurrence score 17: Risk of recurrence 11% with tamoxifen alone There was not enough material to send Oncotype on the right breast. Adjuvant radiation therapy 08/13/2017-09/29/2017  Current treatment:Tamoxifen 20 mg daily originally started February 2019 off for 6 weeks restarted April 2020 at 10 mg  Tamoxifen toxicities: 1.Thinning of hair 2.Fatigue 3.Insomnia: She would like to get a sleep study done. I ordered a sleep study. 4.Hot flashes: Effexor appears to be helping significantly. 5.Bilateral hip pains: better at 10 mg 6. SOB: To exertion since she started tamoxifen.  We discussed about cutting the dose down to 5 mg.  At this time she would like to keep the dosage at the same 10 mg and if she cannot tolerate it she will call us back and we will send a prescription for 10 mg dose that she can break it in half.  Breast cancer surveillance: Mammogram 04/20/2019: Benign breast density category C MRI of the breast December 2019: Benign  Return to clinicin 1 year for follow-up

## 2019-05-03 NOTE — Progress Notes (Signed)
HEMATOLOGY-ONCOLOGY MYCHART VIDEO VISIT PROGRESS NOTE  I connected with Margaret Rojas on 05/04/2019 at  2:00 PM EDT by MyChart video conference and verified that I am speaking with the correct person using two identifiers.  I discussed the limitations, risks, security and privacy concerns of performing an evaluation and management service by MyChart and the availability of in person appointments.  I also discussed with the patient that there may be a patient responsible charge related to this service. The patient expressed understanding and agreed to proceed.   Patient's Location: Home Physician Location: Clinic  CHIEF COMPLIANT: Follow-up of bilateral breast cancers on tamoxifen therapy  INTERVAL HISTORY: Margaret Rojas is a 43 y.o. female with above-mentioned history of bilateral breast cancers treated with lumpectomies and re-excision, adjuvant radiation, and who is currently on tamoxifen therapy '10mg'$  daily. Mammogram on 04/20/19 showed no evidence of malignancy bilaterally. She presents over MyChart today for follow-up.   Oncology History  Malignant neoplasm of upper-outer quadrant of left breast in female, estrogen receptor positive ([place])  05/13/2017 Initial Diagnosis   Palpable left breast mass with distortion at 1:30 position: 1.1 cm with left axillary lymph node, 3.5 mm cortex, biopsy tubular 05/21/2017. Biopsy of the breast mass revealed grade 1-2 IDC with DCIS ER 95%, PR 95%, Ki-67 20%, HER-2 negative ratio 1.32 T1c N0 stage IA   05/26/2017 Genetic Testing   Negative genetic testing on the 9-gene STAT panel.The STAT Breast cancer panel offered by [facility] includes sequencing and rearrangement analysis for the following 9 genes:  ATM, BRCA1, BRCA2, CDH1, CHEK2, PALB2, PTEN, STK11 and TP53.   The report date is 05/26/2017.  Negative genetic testing on the reflexed common hereditary cancer panel.  The Hereditary Gene Panel offered by [facility] includes sequencing and/or deletion  duplication testing of the following 46 genes: APC, ATM, AXIN2, BARD1, BMPR1A, BRCA1, BRCA2, BRIP1, CDH1, CDKN2A (p14ARF), CDKN2A (p16INK4a), CHEK2, CTNNA1, DICER1, EPCAM (Deletion/duplication testing only), GREM1 (promoter region deletion/duplication testing only), KIT, MEN1, MLH1, MSH2, MSH3, MSH6, MUTYH, NBN, NF1, NHTL1, PALB2, PDGFRA, PMS2, POLD1, POLE, PTEN, RAD50, RAD51C, RAD51D, SDHB, SDHC, SDHD, SMAD4, SMARCA4. STK11, TP53, TSC1, TSC2, and VHL.  The following genes were evaluated for sequence changes only: SDHA and HOXB13 c.251G>A variant only.  The report date is May 26, 2017.   06/19/2017 Surgery   Left lumpectomy: IDC with DCIS, 2.1 cm, 0/9 lymph nodes negative margins negative; ER 95%, PR 95%, HER-2 negative ratio 1.32, Ki-67 20%, T2N0 stage Ib; Right lumpectomy: IDC with DCIS 1.8 cm, focally involving lateral and anterior margins, 0/3 lymph nodes negative, ER 100%, PR 20%, HER-2 negative ratio 1.23, Ki-67 3%, T1CN0 stage I a   06/19/2017 Oncotype testing   Oncotype DX score 17 and 14: Risk of recurrence 11%/9% with tamoxifen alone   07/07/2017 Surgery   Reexcision lateral margin: Residual DCIS intermediate grade, reexcision anterior margin: [facility]   08/13/2017 - 09/29/2017 Radiation Therapy   Adjuvant radiation therapy   10/2017 -  Anti-estrogen oral therapy   Tamoxifen daily   01/20/2018 Cancer Staging   Staging form: Breast, AJCC 8th Edition - Pathologic: Stage IA (pT2, pN0, cM0, G1, ER+, PR+, HER2-) - Signed by Gardenia Phlegm, NP on 01/20/2018   Malignant neoplasm of lower-outer quadrant of right breast of female, estrogen receptor positive ([place])  07/24/2017 Initial Diagnosis   Malignant neoplasm of lower-outer quadrant of right breast of female, estrogen receptor positive ([place])   01/20/2018 Cancer Staging   Staging form: Breast, AJCC 8th Edition - Pathologic:  Stage IA (pT1c, pN0, cM0, G1, ER+, PR+, HER2-) - Signed by Gardenia Phlegm, NP on 01/20/2018      REVIEW OF SYSTEMS:   Constitutional: Denies fevers, chills or abnormal weight loss Eyes: Denies blurriness of vision Ears, nose, mouth, throat, and face: Denies mucositis or sore throat Respiratory: Denies cough, dyspnea or wheezes Cardiovascular: Denies palpitation, chest discomfort Gastrointestinal:  Denies nausea, heartburn or change in bowel habits Skin: Denies abnormal skin rashes Lymphatics: Denies new lymphadenopathy or easy bruising Neurological:Denies numbness, tingling or new weaknesses Behavioral/Psych: Mood is stable, no new changes  Extremities: No lower extremity edema Breast: denies any pain or lumps or nodules in either breasts All other systems were reviewed with the patient and are negative.  Observations/Objective:  There were no vitals filed for this visit. There is no height or weight on file to calculate BMI.  I have reviewed the data as listed CMP Latest Ref Rng & Units 07/30/2018 01/29/2018 06/19/2017  Glucose 65 - 99 mg/dL 145(H) 93 75  BUN 7 - 25 mg/dL '15 21 13  '$ Creatinine 0.50 - 1.10 mg/dL 0.89 0.85 0.67  Sodium 135 - 146 mmol/L 143 140 135  Potassium 3.5 - 5.3 mmol/L 4.0 4.6 3.6  Chloride 98 - 110 mmol/L 110 110(H) 110  CO2 20 - 32 mmol/L 18(L) 24 22  Calcium 8.6 - 10.2 mg/dL 9.2 9.0 8.1(L)  Total Protein 6.1 - 8.1 g/dL 7.0 7.1 -  Total Bilirubin 0.2 - 1.2 mg/dL 0.2 0.2 -  Alkaline Phos 40 - 150 U/L - 46 -  AST 10 - 30 U/L 16 13 -  ALT 6 - 29 U/L 11 13 -    Lab Results  Component Value Date   WBC 5.8 07/30/2018   HGB 13.8 07/30/2018   HCT 40.7 07/30/2018   MCV 90.6 07/30/2018   PLT 246 07/30/2018   NEUTROABS 3,202 07/30/2018      Assessment Plan:  Malignant neoplasm of upper-outer quadrant of left breast in female, estrogen receptor positive ([place]) 06/19/2017:Left lumpectomy: IDC with DCIS, 2.1 cm, 0/9 lymph nodes negative margins negative; ER 95%, PR 95%, HER-2 negative ratio 1.32, Ki-67 20%, T2N0 stage Ib;  Right lumpectomy: IDC with  DCIS 1.8 cm, focally involving lateral and anterior margins, 0/3 lymph nodes negative,reexcision anterior and lateral margins DCIS,ER 100%, PR 20%, HER-2 negative ratio 1.23, Ki-67 3%, T1CN0 stage I a  Oncotype DX recurrence score 17: Risk of recurrence 11% with tamoxifen alone There was not enough material to send Oncotype on the right breast. Adjuvant radiation therapy 08/13/2017-09/29/2017  Current treatment:Tamoxifen 20 mg daily originally started February 2019 off for 6 weeks restarted April 2020 at 10 mg  Tamoxifen toxicities: 1.Thinning of hair 2.Fatigue 3.Insomnia 4.Hot flashes: Cymbalta appears to be helping significantly. 5.Bilateral hip pains: better at 10 mg 6. SOB: better on 10 mg  Breast cancer surveillance: Mammogram 04/20/2019: Benign breast density category C MRI of the breast December 2019: Benign  Return to clinicin 1 year for follow-up    I discussed the assessment and treatment plan with the patient. The patient was provided an opportunity to ask questions and all were answered. The patient agreed with the plan and demonstrated an understanding of the instructions. The patient was advised to call back or seek an in-person evaluation if the symptoms worsen or if the condition fails to improve as anticipated.   I provided 15 minutes of face-to-face MyChart video visit time during this encounter.    Rulon Eisenmenger, MD 05/04/2019  I, Molly Dorshimer, am acting as scribe for Nicholas Lose, MD.  I have reviewed the above documentation for accuracy and completeness, and I agree with the above.

## 2019-05-04 ENCOUNTER — Inpatient Hospital Stay: Payer: 59 | Attending: Hematology and Oncology | Admitting: Hematology and Oncology

## 2019-05-04 DIAGNOSIS — M25551 Pain in right hip: Secondary | ICD-10-CM | POA: Insufficient documentation

## 2019-05-04 DIAGNOSIS — F411 Generalized anxiety disorder: Secondary | ICD-10-CM

## 2019-05-04 DIAGNOSIS — F330 Major depressive disorder, recurrent, mild: Secondary | ICD-10-CM | POA: Diagnosis not present

## 2019-05-04 DIAGNOSIS — R0602 Shortness of breath: Secondary | ICD-10-CM | POA: Insufficient documentation

## 2019-05-04 DIAGNOSIS — M25552 Pain in left hip: Secondary | ICD-10-CM | POA: Insufficient documentation

## 2019-05-04 DIAGNOSIS — Z170 Estrogen receptor positive status [ER+]: Secondary | ICD-10-CM

## 2019-05-04 DIAGNOSIS — C50412 Malignant neoplasm of upper-outer quadrant of left female breast: Secondary | ICD-10-CM | POA: Diagnosis not present

## 2019-05-04 DIAGNOSIS — C50511 Malignant neoplasm of lower-outer quadrant of right female breast: Secondary | ICD-10-CM | POA: Insufficient documentation

## 2019-05-04 DIAGNOSIS — G4700 Insomnia, unspecified: Secondary | ICD-10-CM | POA: Insufficient documentation

## 2019-05-04 DIAGNOSIS — Z79810 Long term (current) use of selective estrogen receptor modulators (SERMs): Secondary | ICD-10-CM | POA: Insufficient documentation

## 2019-06-06 ENCOUNTER — Emergency Department (HOSPITAL_BASED_OUTPATIENT_CLINIC_OR_DEPARTMENT_OTHER): Payer: 59

## 2019-06-06 ENCOUNTER — Emergency Department (HOSPITAL_BASED_OUTPATIENT_CLINIC_OR_DEPARTMENT_OTHER)
Admission: EM | Admit: 2019-06-06 | Discharge: 2019-06-06 | Disposition: A | Discharge status: Home / Self Care | Payer: 59 | Attending: Emergency Medicine | Admitting: Emergency Medicine

## 2019-06-06 ENCOUNTER — Encounter (HOSPITAL_BASED_OUTPATIENT_CLINIC_OR_DEPARTMENT_OTHER): Payer: Self-pay | Admitting: *Deleted

## 2019-06-06 ENCOUNTER — Emergency Department (HOSPITAL_COMMUNITY): Payer: 59

## 2019-06-06 ENCOUNTER — Other Ambulatory Visit: Payer: Self-pay

## 2019-06-06 DIAGNOSIS — M79605 Pain in left leg: Secondary | ICD-10-CM | POA: Insufficient documentation

## 2019-06-06 DIAGNOSIS — Z882 Allergy status to sulfonamides status: Secondary | ICD-10-CM | POA: Insufficient documentation

## 2019-06-06 DIAGNOSIS — Z887 Allergy status to serum and vaccine status: Secondary | ICD-10-CM | POA: Insufficient documentation

## 2019-06-06 DIAGNOSIS — Z79899 Other long term (current) drug therapy: Secondary | ICD-10-CM | POA: Insufficient documentation

## 2019-06-06 DIAGNOSIS — R2242 Localized swelling, mass and lump, left lower limb: Secondary | ICD-10-CM | POA: Diagnosis not present

## 2019-06-06 DIAGNOSIS — Z853 Personal history of malignant neoplasm of breast: Secondary | ICD-10-CM | POA: Insufficient documentation

## 2019-06-06 DIAGNOSIS — Z888 Allergy status to other drugs, medicaments and biological substances status: Secondary | ICD-10-CM | POA: Insufficient documentation

## 2019-06-06 DIAGNOSIS — Z87891 Personal history of nicotine dependence: Secondary | ICD-10-CM | POA: Diagnosis not present

## 2019-06-06 DIAGNOSIS — M79662 Pain in left lower leg: Secondary | ICD-10-CM

## 2019-06-06 DIAGNOSIS — M7989 Other specified soft tissue disorders: Secondary | ICD-10-CM

## 2019-06-06 NOTE — ED Notes (Signed)
Patient transported to Ultrasound 

## 2019-06-06 NOTE — Discharge Instructions (Signed)
Your ultrasound today was negative and did not show any evidence of blood clot.  We had a shared decision-making conversation together and agreed to not pursue further labs or imaging at this time however we do want you to follow-up with your oncologist to discuss further management.  Please rest and keep your legs elevated and you may also use compression stockings to help with any edema.  If any symptoms change or worsen, please return immediately to the nearest emergency department.

## 2019-06-06 NOTE — ED Notes (Signed)
ED Provider at bedside. 
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
## 2019-06-06 NOTE — ED Triage Notes (Signed)
Pain in her left calf and swelling to her left foot today. She feels she has a DVT.

## 2019-06-07 ENCOUNTER — Telehealth: Payer: Self-pay | Admitting: *Deleted

## 2019-06-07 ENCOUNTER — Other Ambulatory Visit: Payer: Self-pay

## 2019-06-07 ENCOUNTER — Inpatient Hospital Stay: Payer: 59 | Attending: Hematology and Oncology | Admitting: Medical

## 2019-06-07 VITALS — BP 124/83 | HR 81 | Temp 98.2°F | Resp 18 | Ht 68.0 in | Wt 202.3 lb

## 2019-06-07 DIAGNOSIS — Z87891 Personal history of nicotine dependence: Secondary | ICD-10-CM

## 2019-06-07 DIAGNOSIS — C50412 Malignant neoplasm of upper-outer quadrant of left female breast: Secondary | ICD-10-CM | POA: Diagnosis not present

## 2019-06-07 DIAGNOSIS — Z803 Family history of malignant neoplasm of breast: Secondary | ICD-10-CM | POA: Diagnosis not present

## 2019-06-07 DIAGNOSIS — M79662 Pain in left lower leg: Secondary | ICD-10-CM | POA: Insufficient documentation

## 2019-06-07 DIAGNOSIS — Z8249 Family history of ischemic heart disease and other diseases of the circulatory system: Secondary | ICD-10-CM | POA: Diagnosis not present

## 2019-06-07 DIAGNOSIS — Z79810 Long term (current) use of selective estrogen receptor modulators (SERMs): Secondary | ICD-10-CM | POA: Diagnosis not present

## 2019-06-07 DIAGNOSIS — Z170 Estrogen receptor positive status [ER+]: Secondary | ICD-10-CM | POA: Insufficient documentation

## 2019-06-07 NOTE — Patient Instructions (Signed)
COVID-19: How to Protect Yourself and Others Know how it spreads  There is currently no vaccine to prevent coronavirus disease 2019 (COVID-19).  The best way to prevent illness is to avoid being exposed to this virus.  The virus is thought to spread mainly from person-to-person. ? Between people who are in close contact with one another (within about 6 feet). ? Through respiratory droplets produced when an infected person coughs, sneezes or talks. ? These droplets can land in the mouths or noses of people who are nearby or possibly be inhaled into the lungs. ? Some recent studies have suggested that COVID-19 may be spread by people who are not showing symptoms. Everyone should Clean your hands often  Wash your hands often with soap and water for at least 20 seconds especially after you have been in a public place, or after blowing your nose, coughing, or sneezing.  If soap and water are not readily available, use a hand sanitizer that contains at least 60% alcohol. Cover all surfaces of your hands and rub them together until they feel dry.  Avoid touching your eyes, nose, and mouth with unwashed hands. Avoid close contact  Stay home if you are sick.  Avoid close contact with people who are sick.  Put distance between yourself and other people. ? Remember that some people without symptoms may be able to spread virus. ? This is especially important for people who are at higher risk of getting very sick.www.cdc.gov/coronavirus/2019-ncov/need-extra-precautions/people-at-higher-risk.html Cover your mouth and nose with a cloth face cover when around others  You could spread COVID-19 to others even if you do not feel sick.  Everyone should wear a cloth face cover when they have to go out in public, for example to the grocery store or to pick up other necessities. ? Cloth face coverings should not be placed on young children under age 2, anyone who has trouble breathing, or is unconscious,  incapacitated or otherwise unable to remove the mask without assistance.  The cloth face cover is meant to protect other people in case you are infected.  Do NOT use a facemask meant for a healthcare worker.  Continue to keep about 6 feet between yourself and others. The cloth face cover is not a substitute for social distancing. Cover coughs and sneezes  If you are in a private setting and do not have on your cloth face covering, remember to always cover your mouth and nose with a tissue when you cough or sneeze or use the inside of your elbow.  Throw used tissues in the trash.  Immediately wash your hands with soap and water for at least 20 seconds. If soap and water are not readily available, clean your hands with a hand sanitizer that contains at least 60% alcohol. Clean and disinfect  Clean AND disinfect frequently touched surfaces daily. This includes tables, doorknobs, light switches, countertops, handles, desks, phones, keyboards, toilets, faucets, and sinks. www.cdc.gov/coronavirus/2019-ncov/prevent-getting-sick/disinfecting-your-home.html  If surfaces are dirty, clean them: Use detergent or soap and water prior to disinfection.  Then, use a household disinfectant. You can see a list of EPA-registered household disinfectants here. cdc.gov/coronavirus 01/18/2019 This information is not intended to replace advice given to you by your health care provider. Make sure you discuss any questions you have with your health care provider. Document Released: 12/28/2018 Document Revised: 01/26/2019 Document Reviewed: 12/28/2018 Elsevier Patient Education  2020 Elsevier Inc.  

## 2019-06-07 NOTE — Telephone Encounter (Signed)
Received call from pt stating she was seen in the ED last night for evaluation of left leg swelling and pain. [place] was negative for DVT and pt was told to follow up with oncology in the morning.  Per Dr. Lindi Adie pt to be evaluated in [facility] by Sandi Mealy, PA.  Liza RN notified and high priority message sent to scheduling.

## 2019-06-07 NOTE — Progress Notes (Signed)
Pt presents with L calf pain continued from recent [facility] visit showing negative ultrasound for DVT.  States "it feels like a tourniquet is around my calf".  Denies injury.  Skin intact, no rash, no redness or streaking present, no edema currently but pt has photo of leg with mild edema from [facility] visit.  Afebrile.  Denies trouble breathing or swallowing or CP.  A&Ox4.  Pt also reports mild pain of similar kind of L groin but denies radiation or shooting pain.

## 2019-06-08 ENCOUNTER — Other Ambulatory Visit: Payer: Self-pay | Admitting: Hematology and Oncology
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
## 2019-06-09 ENCOUNTER — Telehealth: Payer: Self-pay

## 2019-06-09 NOTE — Telephone Encounter (Signed)
-----   Message from Gardenia Phlegm, NP sent at 06/09/2019  8:34 AM EDT ----- Please call patient and see how she is doing.  I recommend patient take otc magnesium supplement, 1 tab BID.  She can take Aleve and tylenol for the pain.  We can always get plain films,and perhaps refer to ortho if needed.  Thanks,  Mendel Ryder ----- Message ----- From: Harle Stanford., PA-C Sent: 06/08/2019   3:58 PM EDT To: Gardenia Phlegm, NP

## 2019-06-09 NOTE — Telephone Encounter (Signed)
Spoke with patient this morning to see how she is feeling.  Patient states that right now she is feeling much better.  NP recommends to take magnesium OTC 1 tablet twice a day, aleve and tylenol for any pain.  Let patient know she can be referred to ortho if needed.  Patient voiced understanding of above and thanks for call.  She knows to call if she has any further issues.

## 2019-06-15 ENCOUNTER — Ambulatory Visit (INDEPENDENT_AMBULATORY_CARE_PROVIDER_SITE_OTHER): Payer: 59 | Admitting: Psychiatry

## 2019-06-15 ENCOUNTER — Other Ambulatory Visit: Payer: Self-pay

## 2019-06-15 ENCOUNTER — Encounter (HOSPITAL_COMMUNITY): Payer: Self-pay | Admitting: Psychiatry

## 2019-06-15 DIAGNOSIS — F330 Major depressive disorder, recurrent, mild: Secondary | ICD-10-CM | POA: Diagnosis not present

## 2019-06-15 DIAGNOSIS — F411 Generalized anxiety disorder: Secondary | ICD-10-CM

## 2019-06-15 MED ORDER — CLONAZEPAM 0.5 MG PO TABS: ORAL_TABLET | ORAL | 0 refills | Status: DC

## 2019-06-15 MED ORDER — DULOXETINE HCL 60 MG PO CPEP: 60.0000 mg | ORAL_CAPSULE | Freq: Every day | ORAL | 0 refills | Status: DC

## 2019-06-15 NOTE — Progress Notes (Signed)
Virtual Visit via Telephone Note  I connected with Margaret Rojas on 06/15/19 at  8:20 AM EDT by telephone and verified that I am speaking with the correct person using two identifiers.   I discussed the limitations, risks, security and privacy concerns of performing an evaluation and management service by telephone and the availability of in person appointments. I also discussed with the patient that there may be a patient responsible charge related to this service. The patient expressed understanding and agreed to proceed.   History of Present Illness: Patient was evaluated by phone session.  She is doing better on Cymbalta but sometimes she has difficulty staying asleep.  She takes Ambien every night and few times in a week she did not sleep all night.  However overall her anxiety and depression is better.  She denies any feeling of hopelessness or worthlessness.  Recently she saw in the emergency room for leg swelling because she was scared of blood clot but now she is feeling much better.  She is busy helping her 66 and 43 year old gets in virtual schooling.  She is taking tamoxifen 10 mg and sometimes she get hot flashes it is no side effects of the medication.  Her husband is still working from home.  Her husband works at [employer].  Patient denies any anhedonia, social isolation or any negative thoughts.  She is optimistic.  She feels the current medicine is working.  She takes Klonopin half to 1 tablet as needed when she is very anxious.  Her energy level is okay.  Her appetite is okay.  She reported her weight is stable.    Past Psychiatric History:Reviewed. H/O depression since 2005. Took Lexapro while pregnant, Zoloft until switched to Effexor in November 2018 when diagnosed with breast cancer. We switched to Cymbalta for better control on her chronic pain. Tried Xanax and Lunesta (d/c metallic taste). No history of suicidal attempt or inpatient psychiatric treatment.    Psychiatric Specialty Exam: Physical Exam  ROS  Weight 202 lb (91.6 kg), last menstrual period 05/31/2019.Body mass index is 30.71 kg/m.  General Appearance: NA  Eye Contact:  NA  Speech:  Clear and Coherent and Normal Rate  Volume:  Normal  Mood:  Anxious  Affect:  NA  Thought Process:  Goal Directed  Orientation:  Full (Time, Place, and Person)  Thought Content:  WDL  Suicidal Thoughts:  No  Homicidal Thoughts:  No  Memory:  Immediate;   Good Recent;   Good Remote;   Good  Judgement:  Good  Insight:  Good  Psychomotor Activity:  NA  Concentration:  Concentration: Good and Attention Span: Good  Recall:  Good  Fund of Knowledge:  Good  Language:  Good  Akathisia:  No  Handed:  Right  AIMS (if indicated):     Assets:  Communication Skills Desire for Improvement Housing Resilience Social Support  ADL's:  Intact  Cognition:  WNL  Sleep:   fair      Assessment and Plan: Major depressive disorder, recurrent.  Generalized anxiety disorder.  Patient is a stable on Cymbalta 60 mg daily and Klonopin 0.5 mg half to 1 tablet as needed for severe anxiety.  She is also getting Ambien from PCP.  I recommend to discuss with PCP to try Ambien CR for the maintenance of insomnia.  Discussed medication side effects and benefits.  Recommended to call us back if she has any question or any concern.  Follow-up in 3 months.  Follow Up Instructions:  I discussed the assessment and treatment plan with the patient. The patient was provided an opportunity to ask questions and all were answered. The patient agreed with the plan and demonstrated an understanding of the instructions.   The patient was advised to call back or seek an in-person evaluation if the symptoms worsen or if the condition fails to improve as anticipated.  I provided 20 minutes of non-face-to-face time during this encounter.   Kathlee Nations, MD

## 2019-08-05 ENCOUNTER — Telehealth: Payer: Self-pay | Admitting: *Deleted

## 2019-08-05 NOTE — Telephone Encounter (Signed)
Received call from pt stating she has been experiencing chills, lethargy, head congestion and overall "just not feeling right".  Pt requesting information regarding Covid testing.  RN educated pt on community covid testing at the [facility] location.  But states she may go to [facility] tomorrow instead to be tested.  Pt appreciative of the call.

## 2019-09-07 ENCOUNTER — Ambulatory Visit (HOSPITAL_COMMUNITY): Payer: 59 | Admitting: Psychiatry

## 2019-09-14 ENCOUNTER — Ambulatory Visit (HOSPITAL_COMMUNITY): Payer: 59 | Admitting: Psychiatry

## 2019-09-28 ENCOUNTER — Encounter (HOSPITAL_COMMUNITY): Payer: Self-pay | Admitting: Psychiatry

## 2019-09-28 ENCOUNTER — Ambulatory Visit (INDEPENDENT_AMBULATORY_CARE_PROVIDER_SITE_OTHER): Payer: 59 | Admitting: Psychiatry

## 2019-09-28 ENCOUNTER — Other Ambulatory Visit: Payer: Self-pay

## 2019-09-28 DIAGNOSIS — F5101 Primary insomnia: Secondary | ICD-10-CM | POA: Diagnosis not present

## 2019-09-28 DIAGNOSIS — F419 Anxiety disorder, unspecified: Secondary | ICD-10-CM

## 2019-09-28 DIAGNOSIS — F330 Major depressive disorder, recurrent, mild: Secondary | ICD-10-CM | POA: Diagnosis not present

## 2019-09-28 MED ORDER — DULOXETINE HCL 60 MG PO CPEP: 60.0000 mg | ORAL_CAPSULE | Freq: Every day | ORAL | 0 refills | Status: DC

## 2019-09-28 MED ORDER — CLONAZEPAM 0.5 MG PO TABS: ORAL_TABLET | ORAL | 0 refills | Status: DC

## 2019-09-28 MED ORDER — ZOLPIDEM TARTRATE ER 6.25 MG PO TBCR: 6.2500 mg | EXTENDED_RELEASE_TABLET | Freq: Every evening | ORAL | 0 refills | Status: DC | PRN

## 2019-09-28 NOTE — Progress Notes (Signed)
Virtual Visit via Telephone Note  I connected with Margaret Rojas on 09/28/19 at  2:40 PM EST by telephone and verified that I am speaking with the correct person using two identifiers.   I discussed the limitations, risks, security and privacy concerns of performing an evaluation and management service by telephone and the availability of in person appointments. I also discussed with the patient that there may be a patient responsible charge related to this service. The patient expressed understanding and agreed to proceed.   History of Present Illness: Patient was evaluated by phone session.  She is taking Cymbalta and Ambien 5 mg as needed for insomnia and when she is very nervous and anxious she takes Klonopin.  She admitted more anxiety in recent weeks because increased cases of Covid.  Patient told they did not go anywhere on the Christmas.  She admitted some time flashbacks at night due to taking tamoxifen but otherwise her depression is a stable.  She takes Ambien 5 mg and there are nights when she does not sleep all night.  She denies any crying spells or any feeling of hopelessness.  Her husband is working from home and one of her child is started school in person and other doing virtual schooling.  She reported her weight is stable.  Her appetite is okay.  She did not contact her PCP to try Ambien CR instead of regular Ambien.   Psychiatric History:Reviewed. H/O depression since 2005. Took Lexapro whilepregnant, [facility]. We switched to Cymbaltaforbetter control on her chronic pain. Tried Xanax and Lunesta(d/cmetallic taste). No history of suicidal attempt or inpatient psychiatric treatment.    Psychiatric Specialty Exam: Physical Exam  Review of Systems  There were no vitals taken for this visit.There is no height or weight on file to calculate BMI.  General Appearance: NA  Eye Contact:  NA   Speech:  Clear and Coherent  Volume:  Normal  Mood:  Anxious  Affect:  NA  Thought Process:  Goal Directed  Orientation:  Full (Time, Place, and Person)  Thought Content:  WDL  Suicidal Thoughts:  No  Homicidal Thoughts:  No  Memory:  Immediate;   Good Recent;   Good Remote;   Good  Judgement:  Good  Insight:  NA  Psychomotor Activity:  NA  Concentration:  Concentration: Good and Attention Span: Good  Recall:  Good  Fund of Knowledge:  Good  Language:  Good  Akathisia:  No  Handed:  Right  AIMS (if indicated):     Assets:  Communication Skills Desire for Improvement Housing Resilience Social Support  ADL's:  Intact  Cognition:  WNL  Sleep:   fair. Night sweats from tamoxefin      Assessment and Plan: Major depressive disorder, recurrent.  Anxiety.  Primary insomnia.  Patient overall doing well but continues to have some nights when she has insomnia and night sweats from tamoxifen.  I recommend to try Ambien CR 6.25 to help with insomnia.  We will provide 20 tablets and have that works then we will add more refills.  Recommend not to take regular Ambien.  Continue Cymbalta 60 mg daily and Klonopin 0.5 mg half to 1 tablet as needed for severe anxiety.  Recommended to call us back if she is any question of any concern.  Follow-up in 3 months.  Follow Up Instructions:    I discussed the assessment and treatment plan with the patient. The patient was  provided an opportunity to ask questions and all were answered. The patient agreed with the plan and demonstrated an understanding of the instructions.   The patient was advised to call back or seek an in-person evaluation if the symptoms worsen or if the condition fails to improve as anticipated.  I provided 20 minutes of non-face-to-face time during this encounter.   Kathlee Nations, MD

## 2019-11-02 ENCOUNTER — Telehealth (HOSPITAL_COMMUNITY): Payer: Self-pay | Admitting: *Deleted

## 2019-11-02 NOTE — Telephone Encounter (Signed)
Pt called to f/u on visit with Dr. Adele Schilder as relates to vjange in Ambien from IR to CR 6.25 mg and states that she prefers to stay on the regular Ambien 10mg . Please review.

## 2019-11-03 NOTE — Telephone Encounter (Signed)
Ok. She can stop Ambien CR and stay on IR. She has refills.

## 2019-11-04 ENCOUNTER — Other Ambulatory Visit (HOSPITAL_COMMUNITY): Payer: Self-pay | Admitting: Family Medicine

## 2019-11-04 ENCOUNTER — Telehealth (HOSPITAL_COMMUNITY): Payer: Self-pay | Admitting: *Deleted

## 2019-11-04 DIAGNOSIS — F5101 Primary insomnia: Secondary | ICD-10-CM

## 2019-11-04 NOTE — Telephone Encounter (Signed)
Pt

## 2019-11-04 NOTE — Telephone Encounter (Signed)
Pt called to get reason for denial of ambien refill; Dr Glori Bickers said since pt had visit on 09/28/19 for primary insomnia and med was given that pt should ck with [facility] for mgt of insomnia med. Pt voiced understanding and will ck with [facility] and if needed pt will call [place] back.

## 2019-11-04 NOTE — Telephone Encounter (Signed)
Pt will need refills on Ambien 10mg . She has an upcoming appointment on 12/27/19. You last prescribed 3 refills. Is that ok to call in? Or 2 to get her to appointment?

## 2019-11-04 NOTE — Telephone Encounter (Signed)
This  nurse spoke with pt regarding refilling Ambien 10mg . Per Dr. Adele Schilder pt will need to contact PCP, who initially wrote script, to refill this for her. Pt verbalizes understanding.

## 2019-11-04 NOTE — Telephone Encounter (Signed)
She was taking Ambien 10 mg (1/2 to one tab as needed) from PCP. We tried Ambien CR which did not work. She can resume refill from her PCP.

## 2019-11-04 NOTE — Telephone Encounter (Signed)
[facility] manages this Rx, declined and advised pharmacy

## 2019-11-05 ENCOUNTER — Ambulatory Visit
Admission: RE | Admit: 2019-11-05 | Discharge: 2019-11-05 | Disposition: A | Discharge status: Home / Self Care | Payer: 59 | Source: Ambulatory Visit | Attending: Hematology and Oncology | Admitting: Hematology and Oncology

## 2019-11-05 DIAGNOSIS — Z1231 Encounter for screening mammogram for malignant neoplasm of breast: Secondary | ICD-10-CM

## 2019-11-05 MED ORDER — GADOBUTROL 1 MMOL/ML IV SOLN
10.0000 mL | Freq: Once | INTRAVENOUS | Status: AC | PRN
  Administered 2019-11-05: 10 mL via INTRAVENOUS

## 2019-11-07 ENCOUNTER — Telehealth (HOSPITAL_COMMUNITY): Payer: Self-pay | Admitting: *Deleted

## 2019-11-07 DIAGNOSIS — F5101 Primary insomnia: Secondary | ICD-10-CM

## 2019-11-07 MED ORDER — ZOLPIDEM TARTRATE 10 MG PO TABS: 10.0000 mg | ORAL_TABLET | Freq: Every evening | ORAL | 0 refills | Status: DC | PRN

## 2019-11-07 NOTE — Telephone Encounter (Signed)
I called 30 pills for 10 mg Ambien.  She need to take half to 1 tablet as needed for insomnia.  We will discuss her future refills on her next appointment.

## 2019-11-07 NOTE — Telephone Encounter (Signed)
Pt called stating that her PCP, who initially prescribed Ambien 10mg , referred pt back to you. Pt still does not want to take Ambien Cr which was prescribed by you. Can't tolerate it she says. Please advise.

## 2019-11-16 ENCOUNTER — Encounter: Payer: Self-pay | Admitting: Hematology and Oncology

## 2019-11-16 NOTE — Progress Notes (Signed)
Patient Care Team: Tower, Wynelle Fanny, MD as PCP - General (Family Medicine) Rolm Bookbinder, MD as Consulting Physician (General Surgery) Nicholas Lose, MD as Consulting Physician (Hematology and Oncology) Kyung Rudd, MD as Consulting Physician (Radiation Oncology) Gardenia Phlegm, NP as Nurse Practitioner (Hematology and Oncology)  DIAGNOSIS:    ICD-10-CM   1. Malignant neoplasm of upper-outer quadrant of left breast in female, estrogen receptor positive (Margaret Rojas)  C50.412    Z17.0     SUMMARY OF ONCOLOGIC HISTORY: Oncology History  Malignant neoplasm of upper-outer quadrant of left breast in female, estrogen receptor positive (Margaret Rojas)  05/13/2017 Initial Diagnosis   Palpable left breast mass with distortion at 1:30 position: 1.1 cm with left axillary lymph node, 3.5 mm cortex, biopsy tubular 05/21/2017. Biopsy of the breast mass revealed grade 1-2 IDC with DCIS ER 95%, PR 95%, Ki-67 20%, HER-2 negative ratio 1.32 T1c N0 stage IA   05/26/2017 Genetic Testing   Negative genetic testing on the 9-gene STAT panel.The STAT Breast cancer panel offered by [facility] includes sequencing and rearrangement analysis for the following 9 genes:  ATM, BRCA1, BRCA2, CDH1, CHEK2, PALB2, PTEN, STK11 and TP53.   The report date is 05/26/2017.  Negative genetic testing on the reflexed common hereditary cancer panel.  The Hereditary Gene Panel offered by [facility] includes sequencing and/or deletion duplication testing of the following 46 genes: APC, ATM, AXIN2, BARD1, BMPR1A, BRCA1, BRCA2, BRIP1, CDH1, CDKN2A (p14ARF), CDKN2A (p16INK4a), CHEK2, CTNNA1, DICER1, EPCAM (Deletion/duplication testing only), GREM1 (promoter region deletion/duplication testing only), KIT, MEN1, MLH1, MSH2, MSH3, MSH6, MUTYH, NBN, NF1, NHTL1, PALB2, PDGFRA, PMS2, POLD1, POLE, PTEN, RAD50, RAD51C, RAD51D, SDHB, SDHC, SDHD, SMAD4, SMARCA4. STK11, TP53, TSC1, TSC2, and VHL.  The following genes were evaluated for sequence changes only: SDHA  and HOXB13 c.251G>A variant only.  The report date is May 26, 2017.   06/19/2017 Surgery   Left lumpectomy: IDC with DCIS, 2.1 cm, 0/9 lymph nodes negative margins negative; ER 95%, PR 95%, HER-2 negative ratio 1.32, Ki-67 20%, T2N0 stage Ib; Right lumpectomy: IDC with DCIS 1.8 cm, focally involving lateral and anterior margins, 0/3 lymph nodes negative, ER 100%, PR 20%, HER-2 negative ratio 1.23, Ki-67 3%, T1CN0 stage I a   06/19/2017 Oncotype testing   Oncotype DX score 17 and 14: Risk of recurrence 11%/9% with tamoxifen alone   07/07/2017 Surgery   Reexcision lateral margin: Residual DCIS intermediate grade, reexcision anterior margin: [facility]   08/13/2017 - 09/29/2017 Radiation Therapy   Adjuvant radiation therapy   10/2017 -  Anti-estrogen oral therapy   Tamoxifen daily   01/20/2018 Cancer Staging   Staging form: Breast, AJCC 8th Edition - Pathologic: Stage IA (pT2, pN0, cM0, G1, ER+, PR+, HER2-) - Signed by Gardenia Phlegm, NP on 01/20/2018   Malignant neoplasm of lower-outer quadrant of right breast of female, estrogen receptor positive (Margaret Rojas) (Resolved)  07/24/2017 Initial Diagnosis   Malignant neoplasm of lower-outer quadrant of right breast of female, estrogen receptor positive (Margaret Rojas)   01/20/2018 Cancer Staging   Staging form: Breast, AJCC 8th Edition - Pathologic: Stage IA (pT1c, pN0, cM0, G1, ER+, PR+, HER2-) - Signed by Gardenia Phlegm, NP on 01/20/2018     CHIEF COMPLIANT: Follow-up of bilateral breast cancer on tamoxifen therapy  INTERVAL HISTORY: Margaret Rojas is a 44 y.o. with above-mentioned history of bilateral breast cancers treated with lumpectomies and re-excision, adjuvant radiation, and who is currently on tamoxifen therapy '10mg'$  daily. Breast MRI on 11/05/19 showed no evidence of malignancy  bilaterally. She presents to the clinic today for follow-up.  She continues to have the same problems as before.  Fatigue, shortness of breath minimal  exertion, scalp dryness and flakiness.  All of these are related to tamoxifen.  These are much better since she has been on 10 mg daily.  ALLERGIES:  is allergic to pneumovax [pneumococcal polysaccharide vaccine]; sulfonamide derivatives; and epinephrine.  MEDICATIONS:  Current Outpatient Medications  Medication Sig Dispense Refill  . acetaminophen (TYLENOL) 500 MG tablet Take 1,500 mg by mouth 3 (three) times daily as needed for mild pain.     . clindamycin (CLINDAGEL) 1 % gel clindamycin 1 % topical gel    . clonazePAM (KLONOPIN) 0.5 MG tablet Take half to 1 tablet as needed for severe anxiety. 30 tablet 0  . cyclobenzaprine (FLEXERIL) 10 MG tablet Take 10 mg by mouth 2 (two) times daily as needed for muscle spasms (migraines).     . DULoxetine (CYMBALTA) 60 MG capsule Take 1 capsule (60 mg total) by mouth daily. 90 capsule 0  . lidocaine (LIDODERM) 5 % lidocaine 5 % topical patch    . NUCYNTA 50 MG tablet TAKE 1 TABLET BY MOUTH EVERY 8 HOURS AS NEEDED FOR SEVERE PAIN    . tamoxifen (NOLVADEX) 20 MG tablet TAKE 1 TABLET BY MOUTH EVERY DAY 30 tablet 11  . topiramate (TOPAMAX) 100 MG tablet Take 100 mg by mouth at bedtime.     Marland Kitchen zolpidem (AMBIEN) 10 MG tablet Take 1 tablet (10 mg total) by mouth at bedtime as needed. for sleep 30 tablet 0   No current facility-administered medications for this visit.    PHYSICAL EXAMINATION: ECOG PERFORMANCE STATUS: 1 - Symptomatic but completely ambulatory  Vitals:   11/17/19 1333  BP: 107/71  Pulse: 93  Resp: 18  Temp: 98 F (36.7 C)  SpO2: 100%   Filed Weights   11/17/19 1333  Weight: 197 lb 1.6 oz (89.4 kg)    BREAST: No palpable masses or nodules in either right or left breasts. No palpable axillary supraclavicular or infraclavicular adenopathy no breast tenderness or nipple discharge. (exam performed in the presence of a chaperone)  LABORATORY DATA:  I have reviewed the data as listed CMP Latest Ref Rng & Units 07/30/2018 01/29/2018  06/19/2017  Glucose 65 - 99 mg/dL 145(H) 93 75  BUN 7 - 25 mg/dL '15 21 13  '$ Creatinine 0.50 - 1.10 mg/dL 0.89 0.85 0.67  Sodium 135 - 146 mmol/L 143 140 135  Potassium 3.5 - 5.3 mmol/L 4.0 4.6 3.6  Chloride 98 - 110 mmol/L 110 110(H) 110  CO2 20 - 32 mmol/L 18(L) 24 22  Calcium 8.6 - 10.2 mg/dL 9.2 9.0 8.1(L)  Total Protein 6.1 - 8.1 g/dL 7.0 7.1 -  Total Bilirubin 0.2 - 1.2 mg/dL 0.2 0.2 -  Alkaline Phos 40 - 150 U/L - 46 -  AST 10 - 30 U/L 16 13 -  ALT 6 - 29 U/L 11 13 -    Lab Results  Component Value Date   WBC 5.8 07/30/2018   HGB 13.8 07/30/2018   HCT 40.7 07/30/2018   MCV 90.6 07/30/2018   PLT 246 07/30/2018   NEUTROABS 3,202 07/30/2018    ASSESSMENT & PLAN:  Malignant neoplasm of upper-outer quadrant of left breast in female, estrogen receptor positive (Margaret Rojas) 06/19/2017:Left lumpectomy: IDC with DCIS, 2.1 cm, 0/9 lymph nodes negative margins negative; ER 95%, PR 95%, HER-2 negative ratio 1.32, Ki-67 20%, T2N0 stage Ib;  Right lumpectomy:  IDC with DCIS 1.8 cm, focally involving lateral and anterior margins, 0/3 lymph nodes negative,reexcision anterior and lateral margins DCIS,ER 100%, PR 20%, HER-2 negative ratio 1.23, Ki-67 3%, T1CN0 stage I a  Oncotype DX recurrence score 17: Risk of recurrence 11% with tamoxifen alone There was not enough material to send Oncotype on the right breast. Adjuvant radiation therapy 08/13/2017-09/29/2017  Current treatment:Tamoxifen 20 mg daily originally started February 2019off for 6 weeks restarted April 2020 at 10 mg  Tamoxifen toxicities: 1.Thinning of hair 2.Fatigue 3.Insomnia 4.Hot flashes: Cymbalta appears to be helping significantly. 5.Bilateral hip pains:better at 10 mg 6. HQP:RFFMBW on 10 mg  Patient will stop tamoxifen temporarily because she is planning to go [place] and wants to have plenty of energy and strength.  Breast cancer surveillance: Mammogram 04/20/2019: Benign breast density  category C MRI of the breast 11/06/19: Benign  Return to clinicin 1 year for follow-up    No orders of the defined types were placed in this encounter.  The patient has a good understanding of the overall plan. she agrees with it. she will call with any problems that may develop before the next visit here.  Total time spent: 20 mins including face to face time and time spent for planning, charting and coordination of care  Nicholas Lose, MD 11/17/2019  I, Cloyde Reams Dorshimer, am acting as scribe for Dr. Nicholas Lose.  I have reviewed the above documentation for accuracy and completeness, and I agree with the above.

## 2019-11-17 ENCOUNTER — Inpatient Hospital Stay: Payer: 59 | Attending: Hematology and Oncology | Admitting: Hematology and Oncology

## 2019-11-17 ENCOUNTER — Other Ambulatory Visit: Payer: Self-pay

## 2019-11-17 DIAGNOSIS — R0602 Shortness of breath: Secondary | ICD-10-CM | POA: Insufficient documentation

## 2019-11-17 DIAGNOSIS — M25552 Pain in left hip: Secondary | ICD-10-CM | POA: Insufficient documentation

## 2019-11-17 DIAGNOSIS — C50511 Malignant neoplasm of lower-outer quadrant of right female breast: Secondary | ICD-10-CM | POA: Diagnosis not present

## 2019-11-17 DIAGNOSIS — N951 Menopausal and female climacteric states: Secondary | ICD-10-CM | POA: Diagnosis not present

## 2019-11-17 DIAGNOSIS — Z170 Estrogen receptor positive status [ER+]: Secondary | ICD-10-CM | POA: Diagnosis not present

## 2019-11-17 DIAGNOSIS — C50412 Malignant neoplasm of upper-outer quadrant of left female breast: Secondary | ICD-10-CM | POA: Insufficient documentation

## 2019-11-17 DIAGNOSIS — Z79899 Other long term (current) drug therapy: Secondary | ICD-10-CM | POA: Diagnosis not present

## 2019-11-17 DIAGNOSIS — Z923 Personal history of irradiation: Secondary | ICD-10-CM | POA: Insufficient documentation

## 2019-11-17 DIAGNOSIS — M25551 Pain in right hip: Secondary | ICD-10-CM | POA: Insufficient documentation

## 2019-11-17 DIAGNOSIS — Z79810 Long term (current) use of selective estrogen receptor modulators (SERMs): Secondary | ICD-10-CM | POA: Insufficient documentation

## 2019-11-17 MED ORDER — MICONAZOLE NITRATE 2 % EX CREA: 1.0000 "application " | TOPICAL_CREAM | Freq: Two times a day (BID) | CUTANEOUS | 0 refills | Status: DC

## 2019-11-17 NOTE — Assessment & Plan Note (Signed)
06/19/2017:Left lumpectomy: IDC with DCIS, 2.1 cm, 0/9 lymph nodes negative margins negative; ER 95%, PR 95%, HER-2 negative ratio 1.32, Ki-67 20%, T2N0 stage Ib;  Right lumpectomy: IDC with DCIS 1.8 cm, focally involving lateral and anterior margins, 0/3 lymph nodes negative,reexcision anterior and lateral margins DCIS,ER 100%, PR 20%, HER-2 negative ratio 1.23, Ki-67 3%, T1CN0 stage I a  Oncotype DX recurrence score 17: Risk of recurrence 11% with tamoxifen alone There was not enough material to send Oncotype on the right breast. Adjuvant radiation therapy 08/13/2017-09/29/2017  Current treatment:Tamoxifen 20 mg daily originally started February 2019off for 6 weeks restarted April 2020 at 10 mg  Tamoxifen toxicities: 1.Thinning of hair 2.Fatigue 3.Insomnia 4.Hot flashes: Cymbalta appears to be helping significantly. 5.Bilateral hip pains:better at 10 mg 6. QKS:KSHNGI on 10 mg  Breast cancer surveillance: Mammogram 04/20/2019: Benign breast density category C MRI of the breast 11/06/19: Benign  Return to clinicin 1 year for follow-up

## 2019-11-18 ENCOUNTER — Telehealth: Payer: Self-pay | Admitting: Hematology and Oncology

## 2019-11-18 NOTE — Telephone Encounter (Signed)
I left a message regarding schedule  

## 2019-11-20 ENCOUNTER — Ambulatory Visit: Payer: 59 | Attending: Internal Medicine

## 2019-11-20 DIAGNOSIS — Z23 Encounter for immunization: Secondary | ICD-10-CM

## 2019-11-20 NOTE — Progress Notes (Signed)
   Covid-19 Vaccination Clinic  Name:  Margaret Rojas    MRN: [id number] DOB: 10/15/1975  11/20/2019  [name] was observed post Covid-19 immunization for 15 minutes without incident. She was provided with Vaccine Information Sheet and instruction to access the V-Safe system.   [name] was instructed to call 911 with any severe reactions post vaccine: Marland Kitchen Difficulty breathing  . Swelling of face and throat  . A fast heartbeat  . A bad rash all over body  . Dizziness and weakness   Immunizations Administered    Name Date Dose VIS Date Route   Pfizer COVID-19 Vaccine 11/20/2019 11:25 AM 0.3 mL 08/26/2019 Intramuscular   Manufacturer: [place]   Lot: [id number]   [place]: [id number]

## 2019-12-20 ENCOUNTER — Ambulatory Visit: Payer: 59 | Attending: Internal Medicine

## 2019-12-20 DIAGNOSIS — Z23 Encounter for immunization: Secondary | ICD-10-CM

## 2019-12-20 NOTE — Progress Notes (Signed)
   Covid-19 Vaccination Clinic  Name:  Margaret Rojas    MRN: [id number] DOB: November 02, 1975  12/20/2019  [name] was observed post Covid-19 immunization for 15 minutes without incident. She was provided with Vaccine Information Sheet and instruction to access the V-Safe system.   [name] was instructed to call 911 with any severe reactions post vaccine: Marland Kitchen Difficulty breathing  . Swelling of face and throat  . A fast heartbeat  . A bad rash all over body  . Dizziness and weakness   Immunizations Administered    Name Date Dose VIS Date Route   Pfizer COVID-19 Vaccine 12/20/2019 12:39 AM 0.3 mL 08/26/2019 Intramuscular   Manufacturer: [employer]   Lot: [id number]   [place]: [id number]

## 2019-12-27 ENCOUNTER — Encounter (HOSPITAL_COMMUNITY): Payer: Self-pay | Admitting: Psychiatry

## 2019-12-27 ENCOUNTER — Other Ambulatory Visit: Payer: Self-pay

## 2019-12-27 ENCOUNTER — Ambulatory Visit (INDEPENDENT_AMBULATORY_CARE_PROVIDER_SITE_OTHER): Payer: 59 | Admitting: Psychiatry

## 2019-12-27 DIAGNOSIS — F5101 Primary insomnia: Secondary | ICD-10-CM

## 2019-12-27 DIAGNOSIS — F330 Major depressive disorder, recurrent, mild: Secondary | ICD-10-CM

## 2019-12-27 DIAGNOSIS — F419 Anxiety disorder, unspecified: Secondary | ICD-10-CM

## 2019-12-27 MED ORDER — CLONAZEPAM 0.5 MG PO TABS: ORAL_TABLET | ORAL | 0 refills | Status: DC

## 2019-12-27 MED ORDER — DULOXETINE HCL 60 MG PO CPEP: 60.0000 mg | ORAL_CAPSULE | Freq: Every day | ORAL | 0 refills | Status: DC

## 2019-12-27 MED ORDER — ZOLPIDEM TARTRATE 10 MG PO TABS: 10.0000 mg | ORAL_TABLET | Freq: Every evening | ORAL | 0 refills | Status: DC | PRN

## 2019-12-27 NOTE — Progress Notes (Signed)
Virtual Visit via Telephone Note  I connected with Margaret Rojas on 12/27/19 at  3:00 PM EDT by telephone and verified that I am speaking with the correct person using two identifiers.   I discussed the limitations, risks, security and privacy concerns of performing an evaluation and management service by telephone and the availability of in person appointments. I also discussed with the patient that there may be a patient responsible charge related to this service. The patient expressed understanding and agreed to proceed.   History of Present Illness: Patient was evaluated by phone session.  She is taking Cymbalta and Ambien 5 mg every night.  She reported she tried Ambien CR but that did not work and back to Ambien instant release.  It is helping her sleep.  She still have some time nightmares with tamoxifen but overall her sleep is improved.  She had a good spring break vacation with the family and she went to [place].  She had a good time.  She is working at [employer] and her job is going well.  Her husband is working from home and hers children's are in the school.  Overall she feels things are going well.  Lately she having some bilateral feet pain and her physician recommended an MRI to rule out any pinched nerve.  She feels her Cymbalta helps the numbness.  Overall she described things are going well and she would like to keep her current medication.  Her energy level is okay.  Her appetite is okay.    Psychiatric History:Reviewed. H/Odepression since 2005. Took Lexapro whilepregnant, Z[facility]. We switched to Cymbaltaforbetter control on her chronic pain. Tried Xanax and Lunesta(d/cmetallic taste). No history of suicidal attempt or inpatient psychiatric treatment.   Psychiatric Specialty Exam: Physical Exam  Review of Systems  Musculoskeletal:       B/l feet pain    There were no vitals  taken for this visit.There is no height or weight on file to calculate BMI.  General Appearance: NA  Eye Contact:  NA  Speech:  Clear and Coherent  Volume:  Normal  Mood:  Euthymic  Affect:  NA  Thought Process:  Goal Directed  Orientation:  Full (Time, Place, and Person)  Thought Content:  WDL  Suicidal Thoughts:  No  Homicidal Thoughts:  No  Memory:  Immediate;   Good Recent;   Good Remote;   Good  Judgement:  Good  Insight:  Good  Psychomotor Activity:  NA  Concentration:  Concentration: Good and Attention Span: Good  Recall:  Good  Fund of Knowledge:  Good  Language:  Good  Akathisia:  No  Handed:  Right  AIMS (if indicated):     Assets:  Communication Skills Desire for Improvement Housing Resilience Social Support  ADL's:  Intact  Cognition:  WNL  Sleep:   better with ambien      Assessment and Plan: Major depressive disorder, recurrent.  Anxiety.  Primary insomnia.  Patient doing well on her current medication.  She did not like Ambien CR and we switch back to her regular Ambien 10 mg to take half to 1 tablet as needed for insomnia.  She also takes rarely Klonopin when she is very nervous and anxious.  I recommend to continue Cymbalta 60 mg daily.  Discussed medication side effects and benefits.  Recommended to call us back if she has any question or any concern.  Follow-up in 3 months.  Follow Up Instructions:    I discussed the assessment and treatment plan with the patient. The patient was provided an opportunity to ask questions and all were answered. The patient agreed with the plan and demonstrated an understanding of the instructions.   The patient was advised to call back or seek an in-person evaluation if the symptoms worsen or if the condition fails to improve as anticipated.  I provided 20 minutes of non-face-to-face time during this encounter.   Kathlee Nations, MD

## 2019-12-30 ENCOUNTER — Encounter: Payer: Self-pay | Admitting: Family Medicine

## 2020-01-02 ENCOUNTER — Other Ambulatory Visit: Payer: Self-pay | Admitting: Neurosurgery

## 2020-01-02 DIAGNOSIS — Z981 Arthrodesis status: Secondary | ICD-10-CM

## 2020-01-02 DIAGNOSIS — M545 Low back pain, unspecified: Secondary | ICD-10-CM

## 2020-01-26 ENCOUNTER — Ambulatory Visit
Admission: RE | Admit: 2020-01-26 | Discharge: 2020-01-26 | Disposition: A | Discharge status: Home / Self Care | Payer: 59 | Source: Ambulatory Visit | Attending: Neurosurgery | Admitting: Neurosurgery

## 2020-01-26 DIAGNOSIS — M545 Low back pain, unspecified: Secondary | ICD-10-CM

## 2020-01-26 DIAGNOSIS — Z981 Arthrodesis status: Secondary | ICD-10-CM

## 2020-02-13 ENCOUNTER — Emergency Department (HOSPITAL_COMMUNITY): Payer: 59

## 2020-02-13 ENCOUNTER — Emergency Department (HOSPITAL_COMMUNITY)
Admission: EM | Admit: 2020-02-13 | Discharge: 2020-02-13 | Disposition: A | Discharge status: Home / Self Care | Payer: 59 | Attending: Emergency Medicine | Admitting: Emergency Medicine

## 2020-02-13 ENCOUNTER — Encounter (HOSPITAL_COMMUNITY): Payer: Self-pay

## 2020-02-13 DIAGNOSIS — Z79899 Other long term (current) drug therapy: Secondary | ICD-10-CM | POA: Diagnosis not present

## 2020-02-13 DIAGNOSIS — M25572 Pain in left ankle and joints of left foot: Secondary | ICD-10-CM | POA: Insufficient documentation

## 2020-02-13 DIAGNOSIS — Z87891 Personal history of nicotine dependence: Secondary | ICD-10-CM | POA: Diagnosis not present

## 2020-02-13 DIAGNOSIS — R2242 Localized swelling, mass and lump, left lower limb: Secondary | ICD-10-CM | POA: Insufficient documentation

## 2020-02-13 DIAGNOSIS — Z853 Personal history of malignant neoplasm of breast: Secondary | ICD-10-CM | POA: Insufficient documentation

## 2020-02-13 MED ORDER — NAPROXEN 250 MG PO TABS
500.0000 mg | ORAL_TABLET | Freq: Once | ORAL | Status: AC
  Administered 2020-02-13: 500 mg via ORAL
  Filled 2020-02-13: qty 2

## 2020-02-13 NOTE — Discharge Instructions (Addendum)
He presented to the ED with left ankle pain.  X-ray was negative for any acute fractures.  Recommend wearing supportive device.  Recommend scheduled Tylenol Motrin for the next several days as well as ice.  Please follow-up with her orthopedic doctor in 10 to 14 days if pain is still present for evaluation of ligamentous injury and possible repeat imaging for occult fracture.
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
## 2020-02-13 NOTE — ED Notes (Signed)
Patient verbalizes understanding of discharge instructions. Opportunity for questioning and answers were provided. Armband removed by staff, pt discharged from ED via wheelchair to lobby to go home with spouse.

## 2020-02-13 NOTE — Progress Notes (Signed)
Orthopedic Tech Progress Note Patient Details:  GLENDIA KEIM 1976/04/07 [id number]  Ortho Devices Type of Ortho Device: ASO Ortho Device/Splint Location: lle Ortho Device/Splint Interventions: Ordered, Adjustment, Application   Post Interventions Patient Tolerated: Well Instructions Provided: Care of device, Adjustment of device   Karolee Stamps 02/13/2020, 9:41 PM

## 2020-02-13 NOTE — ED Triage Notes (Signed)
Pt arrives POV for eval of L foot pain after rolling her ankle while walking the dog. Pt reports she heard a pop and has had pain since. Reports hx of 2 surgeries to L foot for plantar fasciitis and a neuroma.

## 2020-02-25 ENCOUNTER — Other Ambulatory Visit (HOSPITAL_COMMUNITY): Payer: Self-pay | Admitting: Psychiatry

## 2020-02-25 DIAGNOSIS — F5101 Primary insomnia: Secondary | ICD-10-CM

## 2020-04-05 ENCOUNTER — Telehealth (INDEPENDENT_AMBULATORY_CARE_PROVIDER_SITE_OTHER): Payer: 59 | Admitting: Psychiatry

## 2020-04-05 ENCOUNTER — Other Ambulatory Visit: Payer: Self-pay

## 2020-04-05 ENCOUNTER — Encounter (HOSPITAL_COMMUNITY): Payer: Self-pay | Admitting: Psychiatry

## 2020-04-05 DIAGNOSIS — F330 Major depressive disorder, recurrent, mild: Secondary | ICD-10-CM | POA: Diagnosis not present

## 2020-04-05 DIAGNOSIS — F419 Anxiety disorder, unspecified: Secondary | ICD-10-CM | POA: Diagnosis not present

## 2020-04-05 DIAGNOSIS — F5101 Primary insomnia: Secondary | ICD-10-CM

## 2020-04-05 MED ORDER — DULOXETINE HCL 30 MG PO CPEP: 90.0000 mg | ORAL_CAPSULE | Freq: Every day | ORAL | 2 refills | Status: DC

## 2020-04-05 MED ORDER — ZOLPIDEM TARTRATE 10 MG PO TABS: 10.0000 mg | ORAL_TABLET | Freq: Every evening | ORAL | 0 refills | Status: DC | PRN

## 2020-04-05 NOTE — Progress Notes (Signed)
Virtual Visit via Telephone Note  I connected with Margaret Rojas on 04/05/20 at  3:00 PM EDT by telephone and verified that I am speaking with the correct person using two identifiers.  Location: Patient: home Provider: home office   I discussed the limitations, risks, security and privacy concerns of performing an evaluation and management service by telephone and the availability of in person appointments. I also discussed with the patient that there may be a patient responsible charge related to this service. The patient expressed understanding and agreed to proceed.   History of Present Illness: Patient is evaluated by phone session.  She endorsed had a difficult summer because she fell and broke her foot and she had to wear boots for a while.  She is doing physical therapy.  She noticed since she had injury she has been more isolated, withdrawn with lack of motivation to do things.  She noticed getting easily irritable and sometimes having arguments with the parents.  She does not enjoy family time especially with the kids.  She is sleeping okay with Ambien.  She takes Ambien 5 mg 3-4 times a week.  But she denies any crying spells or any feeling of hopelessness or suicidal thoughts but described her mood is sad and tired.  She is wondering if she can try higher dose of Cymbalta.  So far she is tolerating her medication and reported no side effects.  She has no tremors, shakes or any EPS.  Her energy level is fair.  Her appetite is okay and her weight is unchanged from the past.  Patient is working at [employer] and some days she goes in person.  She had a good support from her husband.   Psychiatric History:Reviewed. H/Odepression since 2005. Took Lexapro whilepregnant, Zoloft [facility]. We switched to Cymbaltaforbetter control on her chronic pain. Tried Xanax and Lunesta(d/cmetallic taste). No  history of suicidal attempt or inpatient psychiatric treatment.    Psychiatric Specialty Exam: Physical Exam  Review of Systems  There were no vitals taken for this visit.There is no height or weight on file to calculate BMI.  General Appearance: NA  Eye Contact:  Absent  Speech:  Clear and Coherent  Volume:  Normal  Mood:  Anxious and Depressed  Affect:  NA  Thought Process:  Goal Directed  Orientation:  Full (Time, Place, and Person)  Thought Content:  NA  Suicidal Thoughts:  No  Homicidal Thoughts:  No  Memory:  Immediate;   Good Recent;   Good Remote;   Good  Judgement:  Good  Insight:  Present  Psychomotor Activity:  NA  Concentration:  Concentration: Fair and Attention Span: Fair  Recall:  Good  Fund of Knowledge:  Good  Language:  Good  Akathisia:  No  Handed:  Right  AIMS (if indicated):     Assets:  Communication Skills Desire for Improvement Housing Resilience Social Support Transportation  ADL's:  Intact  Cognition:  WNL  Sleep:         Assessment and Plan: Major depressive disorder, recurrent.  Anxiety.  Insomnia.  Discuss current situation causing worsening of depression.  I agree we can try Cymbalta 90 mg to see if that helps her visible mood lability and depression.  She will continue Ambien 5 to 10 mg as needed for insomnia.  She has not taken Klonopin recently except 1 day when she had an argument with her parents.  She has enough  refills.  I recommend if higher dose of Cymbalta did not work then we will consider medication changes.  She agreed with the plan.  She like to keep appointment for 3 months.  Recommended to call us back if she has any question, concern of treatment worsening of the symptom.  Follow Up Instructions:    I discussed the assessment and treatment plan with the patient. The patient was provided an opportunity to ask questions and all were answered. The patient agreed with the plan and demonstrated an understanding of the  instructions.   The patient was advised to call back or seek an in-person evaluation if the symptoms worsen or if the condition fails to improve as anticipated.  I provided 15 minutes of non-face-to-face time during this encounter.   Kathlee Nations, MD

## 2020-04-16 ENCOUNTER — Telehealth (HOSPITAL_COMMUNITY): Payer: Self-pay | Admitting: *Deleted

## 2020-04-16 ENCOUNTER — Other Ambulatory Visit (HOSPITAL_COMMUNITY): Payer: Self-pay | Admitting: *Deleted

## 2020-04-16 DIAGNOSIS — F330 Major depressive disorder, recurrent, mild: Secondary | ICD-10-CM

## 2020-04-16 DIAGNOSIS — F419 Anxiety disorder, unspecified: Secondary | ICD-10-CM

## 2020-04-16 MED ORDER — DULOXETINE HCL 60 MG PO CPEP: 60.0000 mg | ORAL_CAPSULE | Freq: Every day | ORAL | 1 refills | Status: DC

## 2020-04-16 MED ORDER — DULOXETINE HCL 30 MG PO CPEP: ORAL_CAPSULE | ORAL | 1 refills | Status: DC

## 2020-04-16 NOTE — Telephone Encounter (Signed)
Telephone call to patient regarding refill of Cymbalta 30 mg three daily. Insurance would not pay for three a day. Patient requests Cymbalta 60 mg to cover for now. Possibly could we get orders for Cymbalta 30 mg and 60 mg. Her appointment is October 18. Please review and advise.

## 2020-04-16 NOTE — Telephone Encounter (Signed)
We continue Cymbalta 60 mg and 30 mg two prescription.  Please call the pharmacy if she agree.

## 2020-05-22 ENCOUNTER — Other Ambulatory Visit: Payer: Self-pay | Admitting: Hematology and Oncology

## 2020-05-22 DIAGNOSIS — Z9889 Other specified postprocedural states: Secondary | ICD-10-CM

## 2020-05-22 DIAGNOSIS — Z853 Personal history of malignant neoplasm of breast: Secondary | ICD-10-CM

## 2020-06-06 ENCOUNTER — Ambulatory Visit
Admission: RE | Admit: 2020-06-06 | Discharge: 2020-06-06 | Disposition: A | Discharge status: Home / Self Care | Payer: 59 | Source: Ambulatory Visit | Attending: Hematology and Oncology | Admitting: Hematology and Oncology

## 2020-06-06 ENCOUNTER — Other Ambulatory Visit: Payer: Self-pay | Admitting: Hematology and Oncology

## 2020-06-06 ENCOUNTER — Other Ambulatory Visit: Payer: Self-pay

## 2020-06-06 DIAGNOSIS — Z853 Personal history of malignant neoplasm of breast: Secondary | ICD-10-CM

## 2020-06-06 DIAGNOSIS — Z9889 Other specified postprocedural states: Secondary | ICD-10-CM

## 2020-06-07 ENCOUNTER — Ambulatory Visit
Admission: RE | Admit: 2020-06-07 | Discharge: 2020-06-07 | Disposition: A | Discharge status: Home / Self Care | Payer: 59 | Source: Ambulatory Visit | Attending: Hematology and Oncology | Admitting: Hematology and Oncology

## 2020-06-07 ENCOUNTER — Other Ambulatory Visit: Payer: Self-pay

## 2020-06-07 ENCOUNTER — Other Ambulatory Visit: Payer: Self-pay | Admitting: Hematology and Oncology

## 2020-06-07 DIAGNOSIS — Z9889 Other specified postprocedural states: Secondary | ICD-10-CM

## 2020-06-07 DIAGNOSIS — Z853 Personal history of malignant neoplasm of breast: Secondary | ICD-10-CM

## 2020-06-07 HISTORY — PX: BREAST EXCISIONAL BIOPSY: SUR124

## 2020-06-10 ENCOUNTER — Other Ambulatory Visit (HOSPITAL_COMMUNITY): Payer: Self-pay | Admitting: Psychiatry

## 2020-06-10 DIAGNOSIS — F5101 Primary insomnia: Secondary | ICD-10-CM

## 2020-06-11 ENCOUNTER — Other Ambulatory Visit: Payer: Self-pay | Admitting: Hematology and Oncology

## 2020-06-18 ENCOUNTER — Other Ambulatory Visit (HOSPITAL_COMMUNITY): Payer: Self-pay | Admitting: *Deleted

## 2020-06-18 DIAGNOSIS — F5101 Primary insomnia: Secondary | ICD-10-CM

## 2020-06-18 MED ORDER — ZOLPIDEM TARTRATE 10 MG PO TABS: 10.0000 mg | ORAL_TABLET | Freq: Every evening | ORAL | 0 refills | Status: DC | PRN

## 2020-07-02 ENCOUNTER — Telehealth (HOSPITAL_COMMUNITY): Payer: 59 | Admitting: Psychiatry

## 2020-08-13 ENCOUNTER — Telehealth (HOSPITAL_COMMUNITY): Payer: Self-pay | Admitting: *Deleted

## 2020-08-13 DIAGNOSIS — F5101 Primary insomnia: Secondary | ICD-10-CM

## 2020-08-13 MED ORDER — ZOLPIDEM TARTRATE 10 MG PO TABS: 10.0000 mg | ORAL_TABLET | Freq: Every evening | ORAL | 0 refills | Status: DC | PRN

## 2020-08-13 NOTE — Telephone Encounter (Signed)
Pt called requesting refill on the Ambien last filled 06/18/20. FYI pt has no upcoming appointments and cx last appointment on 07/02/20. Please review and advise.

## 2020-08-13 NOTE — Telephone Encounter (Signed)
I Send #20 tab. She need to r/s appointment for future refills.

## 2020-09-18 ENCOUNTER — Other Ambulatory Visit: Payer: Self-pay

## 2020-09-18 ENCOUNTER — Telehealth (INDEPENDENT_AMBULATORY_CARE_PROVIDER_SITE_OTHER): Payer: 59 | Admitting: Psychiatry

## 2020-09-18 ENCOUNTER — Encounter (HOSPITAL_COMMUNITY): Payer: Self-pay | Admitting: Psychiatry

## 2020-09-18 VITALS — Wt 188.0 lb

## 2020-09-18 DIAGNOSIS — F419 Anxiety disorder, unspecified: Secondary | ICD-10-CM

## 2020-09-18 DIAGNOSIS — F5101 Primary insomnia: Secondary | ICD-10-CM

## 2020-09-18 DIAGNOSIS — F330 Major depressive disorder, recurrent, mild: Secondary | ICD-10-CM | POA: Diagnosis not present

## 2020-09-18 MED ORDER — DULOXETINE HCL 30 MG PO CPEP: ORAL_CAPSULE | ORAL | 0 refills | Status: DC

## 2020-09-18 MED ORDER — DULOXETINE HCL 60 MG PO CPEP: 60.0000 mg | ORAL_CAPSULE | Freq: Every day | ORAL | 0 refills | Status: DC

## 2020-09-18 MED ORDER — ZOLPIDEM TARTRATE 10 MG PO TABS: 10.0000 mg | ORAL_TABLET | Freq: Every evening | ORAL | 2 refills | Status: DC | PRN

## 2020-09-18 NOTE — Progress Notes (Signed)
Virtual Visit via Telephone Note  I connected with Margaret Rojas on 09/18/20 at  2:40 PM EST by telephone and verified that I am speaking with the correct person using two identifiers.  Location: Patient: Home Provider: Home Office   I discussed the limitations, risks, security and privacy concerns of performing an evaluation and management service by telephone and the availability of in person appointments. I also discussed with the patient that there may be a patient responsible charge related to this service. The patient expressed understanding and agreed to proceed.   History of Present Illness: Patient is evaluated by phone session.  She is on the phone by herself.  She was last evaluated in summer and at that time she was having a lot of anxiety.  We have adjusted the dose of Cymbalta and now she is taking 90 mg.  She apologized missing appointment but overall she reported things are going well.  She had a good Christmas.  Her husband is out of the town because one of his family member is sick.  She is sleeping good with the Ambien.  She denies any crying spells or any feeling of hopelessness.  Since her foot is better she started walking 2 to 3 miles and that is helping her motivation.  She denies any hallucination, paranoia or any suicidal thoughts.  Her weight is good and her energy level is improved.  She is working at [employer].  She has no tremors shakes or any EPS.  She like to keep her current medication.   Psychiatric History:Reviewed. H/Odepression since 2005. Took Lexapro whilepregnant, Zoloft untilswitched to [facility] November 2018when diagnosed with breast cancer. We switched to Cymbaltaforbetter control on her chronic pain. Tried Xanax and Lunesta(d/cmetallic taste). No history of suicidal attempt or inpatient psychiatric treatment.  Psychiatric Specialty Exam: Physical Exam  Review of Systems  There were no vitals taken for this visit.There  is no height or weight on file to calculate BMI.  General Appearance: NA  Eye Contact:  NA  Speech:  Clear and Coherent and Normal Rate  Volume:  Normal  Mood:  Euthymic  Affect:  NA  Thought Process:  Goal Directed  Orientation:  Full (Time, Place, and Person)  Thought Content:  WDL  Suicidal Thoughts:  No  Homicidal Thoughts:  No  Memory:  Immediate;   Good Recent;   Good Remote;   Good  Judgement:  Intact  Insight:  Present  Psychomotor Activity:  NA  Concentration:  Concentration: Good and Attention Span: Good  Recall:  Good  Fund of Knowledge:  Good  Language:  Good  Akathisia:  No  Handed:  Right  AIMS (if indicated):     Assets:  Communication Skills Desire for Improvement Housing Resilience Social Support Talents/Skills Transportation  ADL's:  Intact  Cognition:  WNL  Sleep:   good      Assessment and Plan: Major depressive disorder, recurrent.  Anxiety.  Insomnia.  Patient doing well on her current medication.  She feels higher dose of Cymbalta helped her mood and anxiety.  She is tolerating without any side effects.  She has not taken Klonopin in a while.  However she does take Ambien most of the night which helps her sleep.  Discussed medication side effects and benefits.  Continue Ambien 5 to 10 mg as needed for insomnia, Cymbalta 90 mg daily.  Recommended to call us back if she is any question or any concern.  Follow-up in 3 months.  She does not need a new prescription of Klonopin.  Follow Up Instructions:    I discussed the assessment and treatment plan with the patient. The patient was provided an opportunity to ask questions and all were answered. The patient agreed with the plan and demonstrated an understanding of the instructions.   The patient was advised to call back or seek an in-person evaluation if the symptoms worsen or if the condition fails to improve as anticipated.  I provided 14 minutes of non-face-to-face time during this  encounter.   Kathlee Nations, MD

## 2020-11-18 NOTE — Assessment & Plan Note (Signed)
06/19/2017:Left lumpectomy: IDC with DCIS, 2.1 cm, 0/9 lymph nodes negative margins negative; ER 95%, PR 95%, HER-2 negative ratio 1.32, Ki-67 20%, T2N0 stage Ib;  Right lumpectomy: IDC with DCIS 1.8 cm, focally involving lateral and anterior margins, 0/3 lymph nodes negative,reexcision anterior and lateral margins DCIS,ER 100%, PR 20%, HER-2 negative ratio 1.23, Ki-67 3%, T1CN0 stage I a  Oncotype DX recurrence score 17: Risk of recurrence 11% with tamoxifen alone There was not enough material to send Oncotype on the right breast. Adjuvant radiation therapy 08/13/2017-09/29/2017  Current treatment:Tamoxifen 20 mg daily originally started February 2019off for 6 weeks restarted April 2020 at 10 mg  Tamoxifen toxicities: 1.Thinning of hair 2.Fatigue 3.Insomnia 4.Hot flashes:Cymbaltaappears to be helping significantly. 5.Bilateral hip pains:better at 10 mg 6. SWV:TVNRWC on 10 mg  Patient will stop tamoxifen temporarily because she is planning to go to [place] and wants to have plenty of energy and strength.  Breast cancer surveillance: Mammogram9/22/21: 6 mm oval circumscribed mass (Biopsy: 06/07/20:FA) Benign breast density category C MRI of the breast 11/06/19: Benign  Return to clinicin1 year for follow-up

## 2020-11-19 ENCOUNTER — Inpatient Hospital Stay: Payer: 59 | Attending: Hematology and Oncology | Admitting: Hematology and Oncology

## 2020-11-19 ENCOUNTER — Telehealth: Payer: Self-pay | Admitting: Adult Health

## 2020-11-19 ENCOUNTER — Inpatient Hospital Stay: Payer: 59 | Admitting: Hematology and Oncology

## 2020-11-19 ENCOUNTER — Inpatient Hospital Stay: Payer: 59

## 2020-11-19 ENCOUNTER — Other Ambulatory Visit: Payer: Self-pay

## 2020-11-19 DIAGNOSIS — C50412 Malignant neoplasm of upper-outer quadrant of left female breast: Secondary | ICD-10-CM | POA: Diagnosis present

## 2020-11-19 DIAGNOSIS — Z170 Estrogen receptor positive status [ER+]: Secondary | ICD-10-CM | POA: Insufficient documentation

## 2020-11-19 DIAGNOSIS — C50511 Malignant neoplasm of lower-outer quadrant of right female breast: Secondary | ICD-10-CM | POA: Insufficient documentation

## 2020-11-19 DIAGNOSIS — Z923 Personal history of irradiation: Secondary | ICD-10-CM | POA: Diagnosis not present

## 2020-11-19 DIAGNOSIS — Z79810 Long term (current) use of selective estrogen receptor modulators (SERMs): Secondary | ICD-10-CM | POA: Diagnosis not present

## 2020-11-19 LAB — CMP (CANCER CENTER ONLY)
ALT: 10 U/L (ref 0–44)
AST: 14 U/L — ABNORMAL LOW (ref 15–41)
Albumin: 3.8 g/dL (ref 3.5–5.0)
Alkaline Phosphatase: 46 U/L (ref 38–126)
Anion gap: 6 (ref 5–15)
BUN: 17 mg/dL (ref 6–20)
CO2: 25 mmol/L (ref 22–32)
Calcium: 8.7 mg/dL — ABNORMAL LOW (ref 8.9–10.3)
Chloride: 107 mmol/L (ref 98–111)
Creatinine: 1.04 mg/dL — ABNORMAL HIGH (ref 0.44–1.00)
GFR, Estimated: >60 mL/min (ref >60)
Glucose, Bld: 99 mg/dL (ref 70–99)
Potassium: 4.2 mmol/L (ref 3.5–5.1)
Sodium: 138 mmol/L (ref 135–145)
Total Bilirubin: <0.2 mg/dL — ABNORMAL LOW (ref 0.3–1.2)
Total Protein: 7.4 g/dL (ref 6.5–8.1)

## 2020-11-19 LAB — CBC WITH DIFFERENTIAL (CANCER CENTER ONLY)
Abs Immature Granulocytes: 0.01 10*3/uL (ref 0.00–0.07)
Basophils Absolute: 0 10*3/uL (ref 0.0–0.1)
Basophils Relative: 0 %
Eosinophils Absolute: 0.1 10*3/uL (ref 0.0–0.5)
Eosinophils Relative: 1 %
HCT: 42.4 % (ref 36.0–46.0)
Hemoglobin: 13.8 g/dL (ref 12.0–15.0)
Immature Granulocytes: 0 %
Lymphocytes Relative: 39 %
Lymphs Abs: 2 10*3/uL (ref 0.7–4.0)
MCH: 30.7 pg (ref 26.0–34.0)
MCHC: 32.5 g/dL (ref 30.0–36.0)
MCV: 94.4 fL (ref 80.0–100.0)
Monocytes Absolute: 0.6 10*3/uL (ref 0.1–1.0)
Monocytes Relative: 12 %
Neutro Abs: 2.4 10*3/uL (ref 1.7–7.7)
Neutrophils Relative %: 48 %
Platelet Count: 241 10*3/uL (ref 150–400)
RBC: 4.49 MIL/uL (ref 3.87–5.11)
RDW: 12.3 % (ref 11.5–15.5)
WBC Count: 5.1 10*3/uL (ref 4.0–10.5)
nRBC: 0 % (ref 0.0–0.2)

## 2020-11-19 NOTE — Progress Notes (Signed)
Patient Care Team: Tower, Wynelle Fanny, MD as PCP - General (Family Medicine) Rolm Bookbinder, MD as Consulting Physician (General Surgery) Nicholas Lose, MD as Consulting Physician (Hematology and Oncology) Kyung Rudd, MD as Consulting Physician (Radiation Oncology) Gardenia Phlegm, NP as Nurse Practitioner (Hematology and Oncology)  DIAGNOSIS:    ICD-10-CM   1. Malignant neoplasm of upper-outer quadrant of left breast in female, estrogen receptor positive ([place])  C50.412    Z17.0     SUMMARY OF ONCOLOGIC HISTORY: Oncology History  Malignant neoplasm of upper-outer quadrant of left breast in female, estrogen receptor positive ([place])  05/13/2017 Initial Diagnosis   Palpable left breast mass with distortion at 1:30 position: 1.1 cm with left axillary lymph node, 3.5 mm cortex, biopsy tubular 05/21/2017. Biopsy of the breast mass revealed grade 1-2 IDC with DCIS ER 95%, PR 95%, Ki-67 20%, HER-2 negative ratio 1.32 T1c N0 stage IA   05/26/2017 Genetic Testing   Negative genetic testing on the 9-gene STAT panel.The STAT Breast cancer panel offered by [facility] includes sequencing and rearrangement analysis for the following 9 genes:  ATM, BRCA1, BRCA2, CDH1, CHEK2, PALB2, PTEN, STK11 and TP53.   The report date is 05/26/2017.  Negative genetic testing on the reflexed common hereditary cancer panel.  The Hereditary Gene Panel offered by [facility] includes sequencing and/or deletion duplication testing of the following 46 genes: APC, ATM, AXIN2, BARD1, BMPR1A, BRCA1, BRCA2, BRIP1, CDH1, CDKN2A (p14ARF), CDKN2A (p16INK4a), CHEK2, CTNNA1, DICER1, EPCAM (Deletion/duplication testing only), GREM1 (promoter region deletion/duplication testing only), KIT, MEN1, MLH1, MSH2, MSH3, MSH6, MUTYH, NBN, NF1, NHTL1, PALB2, PDGFRA, PMS2, POLD1, POLE, PTEN, RAD50, RAD51C, RAD51D, SDHB, SDHC, SDHD, SMAD4, SMARCA4. STK11, TP53, TSC1, TSC2, and VHL.  The following genes were evaluated for sequence changes only: SDHA  and HOXB13 c.251G>A variant only.  The report date is May 26, 2017.   06/19/2017 Surgery   Left lumpectomy: IDC with DCIS, 2.1 cm, 0/9 lymph nodes negative margins negative; ER 95%, PR 95%, HER-2 negative ratio 1.32, Ki-67 20%, T2N0 stage Ib; Right lumpectomy: IDC with DCIS 1.8 cm, focally involving lateral and anterior margins, 0/3 lymph nodes negative, ER 100%, PR 20%, HER-2 negative ratio 1.23, Ki-67 3%, T1CN0 stage I a   06/19/2017 Oncotype testing   Oncotype DX score 17 and 14: Risk of recurrence 11%/9% with tamoxifen alone   07/07/2017 Surgery   Reexcision lateral margin: Residual DCIS intermediate grade, reexcision anterior margin: [facility]   08/13/2017 - 09/29/2017 Radiation Therapy   Adjuvant radiation therapy   10/2017 -  Anti-estrogen oral therapy   Tamoxifen daily   01/20/2018 Cancer Staging   Staging form: Breast, AJCC 8th Edition - Pathologic: Stage IA (pT2, pN0, cM0, G1, ER+, PR+, HER2-) - Signed by Gardenia Phlegm, NP on 01/20/2018   Malignant neoplasm of lower-outer quadrant of right breast of female, estrogen receptor positive ([place]) (Resolved)  07/24/2017 Initial Diagnosis   Malignant neoplasm of lower-outer quadrant of right breast of female, estrogen receptor positive ([place])   01/20/2018 Cancer Staging   Staging form: Breast, AJCC 8th Edition - Pathologic: Stage IA (pT1c, pN0, cM0, G1, ER+, PR+, HER2-) - Signed by Gardenia Phlegm, NP on 01/20/2018     CHIEF COMPLIANT: Follow-up of bilateral breast cancer on tamoxifen therapy  INTERVAL HISTORY: Margaret Rojas is a 45 y.o. with above-mentioned history of bilateral breast cancers treated with lumpectomies and re-excision, adjuvant radiation, and who is currently on tamoxifen therapy $RemoveBeforeDEI'10mg'OVMXGoAXWsjLgOMl$  daily. Mammogram on 06/06/20 showed a 0.6cm mass at the  4 o'clock position. Biopsy on 06/07/20 showed fibroadenoma and no evidence of malignancy. She presents to the clinic today for follow-up.  Her major complaint today  is abdominal bloating and discomfort especially in the lower quadrants accompanied by nausea decreased appetite and concern that she may have either gluten sensitive enteropathy or ovarian cancer.  ALLERGIES:  is allergic to pneumovax [pneumococcal polysaccharide vaccine], sulfonamide derivatives, and epinephrine.  MEDICATIONS:  Current Outpatient Medications  Medication Sig Dispense Refill  . acetaminophen (TYLENOL) 500 MG tablet Take 1,500 mg by mouth 3 (three) times daily as needed for mild pain.     . clindamycin (CLINDAGEL) 1 % gel clindamycin 1 % topical gel    . clonazePAM (KLONOPIN) 0.5 MG tablet Take half to 1 tablet as needed for severe anxiety. (Patient not taking: Reported on 09/18/2020) 20 tablet 0  . cyclobenzaprine (FLEXERIL) 10 MG tablet Take 10 mg by mouth 2 (two) times daily as needed for muscle spasms (migraines).     . DULoxetine (CYMBALTA) 30 MG capsule Take one capsule (30 mg total) by mouth daily. 90 capsule 0  . DULoxetine (CYMBALTA) 60 MG capsule Take 1 capsule (60 mg total) by mouth daily. 90 capsule 0  . lidocaine (LIDODERM) 5 % lidocaine 5 % topical patch    . miconazole (MICATIN) 2 % cream Apply 1 application topically 2 (two) times daily. 28.35 g 0  . NUCYNTA 50 MG tablet TAKE 1 TABLET BY MOUTH EVERY 8 HOURS AS NEEDED FOR SEVERE PAIN    . tamoxifen (NOLVADEX) 20 MG tablet TAKE 1 TABLET BY MOUTH EVERY DAY 30 tablet 11  . topiramate (TOPAMAX) 100 MG tablet Take 100 mg by mouth at bedtime.     Marland Kitchen zolpidem (AMBIEN) 10 MG tablet Take 1 tablet (10 mg total) by mouth at bedtime as needed. for sleep 30 tablet 2   No current facility-administered medications for this visit.    PHYSICAL EXAMINATION: ECOG PERFORMANCE STATUS: 1 - Symptomatic but completely ambulatory  Vitals:   11/19/20 1355  BP: 110/69  Pulse: 96  Resp: 18  Temp: 97.9 F (36.6 C)  SpO2: 99%   Filed Weights   11/19/20 1355  Weight: 193 lb 8 oz (87.8 kg)    BREAST: No palpable masses or nodules  in either right or left breasts. No palpable axillary supraclavicular or infraclavicular adenopathy no breast tenderness or nipple discharge. (exam performed in the presence of a chaperone)  LABORATORY DATA:  I have reviewed the data as listed CMP Latest Ref Rng & Units 07/30/2018 01/29/2018 06/19/2017  Glucose 65 - 99 mg/dL 145(H) 93 75  BUN 7 - 25 mg/dL $Remove'15 21 13  'OrZgogy$ Creatinine 0.50 - 1.10 mg/dL 0.89 0.85 0.67  Sodium 135 - 146 mmol/L 143 140 135  Potassium 3.5 - 5.3 mmol/L 4.0 4.6 3.6  Chloride 98 - 110 mmol/L 110 110(H) 110  CO2 20 - 32 mmol/L 18(L) 24 22  Calcium 8.6 - 10.2 mg/dL 9.2 9.0 8.1(L)  Total Protein 6.1 - 8.1 g/dL 7.0 7.1 -  Total Bilirubin 0.2 - 1.2 mg/dL 0.2 0.2 -  Alkaline Phos 40 - 150 U/L - 46 -  AST 10 - 30 U/L 16 13 -  ALT 6 - 29 U/L 11 13 -    Lab Results  Component Value Date   WBC 5.8 07/30/2018   HGB 13.8 07/30/2018   HCT 40.7 07/30/2018   MCV 90.6 07/30/2018   PLT 246 07/30/2018   NEUTROABS 3,202 07/30/2018    ASSESSMENT &  PLAN:  Malignant neoplasm of upper-outer quadrant of left breast in female, estrogen receptor positive ([place]) 06/19/2017:Left lumpectomy: IDC with DCIS, 2.1 cm, 0/9 lymph nodes negative margins negative; ER 95%, PR 95%, HER-2 negative ratio 1.32, Ki-67 20%, T2N0 stage Ib;  Right lumpectomy: IDC with DCIS 1.8 cm, focally involving lateral and anterior margins, 0/3 lymph nodes negative,reexcision anterior and lateral margins DCIS,ER 100%, PR 20%, HER-2 negative ratio 1.23, Ki-67 3%, T1CN0 stage I a  Oncotype DX recurrence score 17: Risk of recurrence 11% with tamoxifen alone There was not enough material to send Oncotype on the right breast. Adjuvant radiation therapy 08/13/2017-09/29/2017  Current treatment:Tamoxifen 20 mg daily originally started February 2019off for 6 weeks restarted April 2020 at 10 mg  Tamoxifen toxicities: 1.Hot flashes:Cymbaltaappears to be helping significantly. 2.Bilateral hip pains:better at 10  mg  Breast cancer surveillance: Mammogram9/22/21: 6 mm oval circumscribed mass (Biopsy: 06/07/20:FA) Benign breast density category C MRI of the breast 11/06/19: Benign, next breast MRI will be done in April.  Abdominal pain discomfort: We will obtain celiac antibody panel today.  I will call her with results of the test this week.  I instructed her to make an appointment to see a gynecologist to look at pelvic ultrasound for ovaries.  For spring break she plans to go to [place]  I will call her with results of the labs as well as the breast MRI to be done in April.  No orders of the defined types were placed in this encounter.  The patient has a good understanding of the overall plan. she agrees with it. she will call with any problems that may develop before the next visit here.  Total time spent: 30 mins including face to face time and time spent for planning, charting and coordination of care  Rulon Eisenmenger, MD, MPH 11/19/2020  I, Cloyde Reams Dorshimer, am acting as scribe for Dr. Nicholas Lose.  I have reviewed the above documentation for accuracy and completeness, and I agree with the above.

## 2020-11-20 ENCOUNTER — Encounter: Payer: Self-pay | Admitting: Hematology and Oncology

## 2020-11-20 LAB — GLIADIN ANTIBODIES, SERUM
Antigliadin Abs, IgA: 4 units (ref 0–19)
Gliadin IgG: 2 units (ref 0–19)

## 2020-11-20 LAB — TISSUE TRANSGLUTAMINASE, IGA: Tissue Transglutaminase Ab, IgA: <2 U/mL (ref 0–3)

## 2020-11-21 ENCOUNTER — Inpatient Hospital Stay: Payer: 59 | Admitting: Hematology and Oncology

## 2020-11-21 ENCOUNTER — Other Ambulatory Visit: Payer: Self-pay

## 2020-11-21 DIAGNOSIS — Z170 Estrogen receptor positive status [ER+]: Secondary | ICD-10-CM

## 2020-11-21 DIAGNOSIS — C50412 Malignant neoplasm of upper-outer quadrant of left female breast: Secondary | ICD-10-CM

## 2020-11-28 ENCOUNTER — Ambulatory Visit (HOSPITAL_COMMUNITY)
Admission: RE | Admit: 2020-11-28 | Discharge: 2020-11-28 | Disposition: A | Discharge status: Home / Self Care | Payer: 59 | Source: Ambulatory Visit | Attending: Hematology and Oncology | Admitting: Hematology and Oncology

## 2020-11-28 ENCOUNTER — Encounter: Payer: Self-pay | Admitting: Hematology and Oncology

## 2020-11-28 ENCOUNTER — Encounter (HOSPITAL_COMMUNITY): Payer: Self-pay

## 2020-11-28 ENCOUNTER — Other Ambulatory Visit: Payer: Self-pay

## 2020-11-28 DIAGNOSIS — Z170 Estrogen receptor positive status [ER+]: Secondary | ICD-10-CM | POA: Diagnosis present

## 2020-11-28 DIAGNOSIS — C50412 Malignant neoplasm of upper-outer quadrant of left female breast: Secondary | ICD-10-CM | POA: Insufficient documentation

## 2020-11-28 MED ORDER — IOHEXOL 300 MG/ML  SOLN
100.0000 mL | Freq: Once | INTRAMUSCULAR | Status: AC | PRN
  Administered 2020-11-28: 100 mL via INTRAVENOUS

## 2020-11-29 ENCOUNTER — Telehealth: Payer: Self-pay | Admitting: Hematology and Oncology

## 2020-11-29 NOTE — Telephone Encounter (Signed)
Patient informed me that she may have endometriosis.  She asked me if she does okay to take either Depo Lupron or progesterone. Lupron is perfectly fine because it stops the ovary from producing estrogen. With progesterone, I suggested that she should take it for a short period of time and try to come off of it as soon as her symptoms improve. She will discuss both of these options with her gynecologist and make a decision.

## 2020-12-01 ENCOUNTER — Other Ambulatory Visit (HOSPITAL_COMMUNITY): Payer: Self-pay | Admitting: Psychiatry

## 2020-12-01 DIAGNOSIS — F419 Anxiety disorder, unspecified: Secondary | ICD-10-CM

## 2020-12-01 DIAGNOSIS — F330 Major depressive disorder, recurrent, mild: Secondary | ICD-10-CM

## 2020-12-18 ENCOUNTER — Encounter (HOSPITAL_COMMUNITY): Payer: Self-pay | Admitting: Psychiatry

## 2020-12-18 ENCOUNTER — Telehealth (INDEPENDENT_AMBULATORY_CARE_PROVIDER_SITE_OTHER): Payer: 59 | Admitting: Psychiatry

## 2020-12-18 ENCOUNTER — Other Ambulatory Visit: Payer: Self-pay

## 2020-12-18 DIAGNOSIS — F419 Anxiety disorder, unspecified: Secondary | ICD-10-CM | POA: Diagnosis not present

## 2020-12-18 DIAGNOSIS — F5101 Primary insomnia: Secondary | ICD-10-CM | POA: Diagnosis not present

## 2020-12-18 DIAGNOSIS — F330 Major depressive disorder, recurrent, mild: Secondary | ICD-10-CM

## 2020-12-18 MED ORDER — DULOXETINE HCL 30 MG PO CPEP: ORAL_CAPSULE | ORAL | 0 refills | Status: DC

## 2020-12-18 MED ORDER — ZOLPIDEM TARTRATE 10 MG PO TABS: 10.0000 mg | ORAL_TABLET | Freq: Every evening | ORAL | 2 refills | Status: DC | PRN

## 2020-12-18 MED ORDER — DULOXETINE HCL 60 MG PO CPEP: 60.0000 mg | ORAL_CAPSULE | Freq: Every day | ORAL | 0 refills | Status: DC

## 2020-12-18 NOTE — Progress Notes (Signed)
Virtual Visit via Telephone Note  I connected with Margaret Rojas on 12/18/20 at  2:40 PM EDT by telephone and verified that I am speaking with the correct person using two identifiers.  Location: Patient: Work Provider: Office   I discussed the limitations, risks, security and privacy concerns of performing an evaluation and management service by telephone and the availability of in person appointments. I also discussed with the patient that there may be a patient responsible charge related to this service. The patient expressed understanding and agreed to proceed.   History of Present Illness: Patient is a phone session.  She is currently at work.  She reported things okay but lately more anxious because she is diagnosed with endometriosis and now she may need to take Lupron to suppress the hormone.  She took it last week when she was very stressed but lately she has not taking a regular basis.  She tried to lose weight and she had lost 10 pounds in 1 year.  Since foot pain is much better she walks on a regular basis.  Her job is going well.  She is working at [employer].  She is compliant with medication recently seen test.  She had blood work.  Her creatinine is 1.04 patient reported no side effects.  She denies any crying spells, hopelessness or any anhedonia.  Her energy level is okay.  She likes Ambien because it helps her sleep.  She has no tremors shakes or any EPS.  Past Psychiatric History: H/Odepression since 2005. Took Lexapro whilepregnant, Zoloft until[facility]when [facility]. We switched to Cymbaltaforbetter control on her chronic pain. Tried Xanax and Lunesta(d/cmetallic taste). No history of suicidal attempt or inpatient psychiatric treatment.  Recent Results (from the past 2160 hour(s))  Gliadin Antibodies, Serum     Status: None   Collection Time: 11/19/20  2:31 PM  Result Value Ref Range   Gliadin IgG  2 0 - 19 units    Comment: (NOTE)                   Negative                   0 - 19                   Weak Positive             20 - 30                   Moderate to Strong Positive   >30 Performed At: [facility] [address] [place], [place] [id number] Rush Farmer MD WE:[id number]    Antigliadin Abs, IgA 4 0 - 19 units    Comment: (NOTE)                   Negative                   0 - 19                   Weak Positive             20 - 30                   Moderate to Strong Positive   >30   CMP (Cancer Center only)     Status: Abnormal   Collection Time: 11/19/20  2:32 PM  Result Value  Ref Range   Sodium 138 135 - 145 mmol/L   Potassium 4.2 3.5 - 5.1 mmol/L   Chloride 107 98 - 111 mmol/L   CO2 25 22 - 32 mmol/L   Glucose, Bld 99 70 - 99 mg/dL    Comment: Glucose reference range applies only to samples taken after fasting for at least 8 hours.   BUN 17 6 - 20 mg/dL   Creatinine 1.04 (H) 0.44 - 1.00 mg/dL   Calcium 8.7 (L) 8.9 - 10.3 mg/dL   Total Protein 7.4 6.5 - 8.1 g/dL   Albumin 3.8 3.5 - 5.0 g/dL   AST 14 (L) 15 - 41 U/L   ALT 10 0 - 44 U/L   Alkaline Phosphatase 46 38 - 126 U/L   Total Bilirubin <0.2 (L) 0.3 - 1.2 mg/dL    Comment: REPEATED TO VERIFY   GFR, Estimated >60 >60 mL/min    Comment: (NOTE) Calculated using the CKD-EPI Creatinine Equation (2021)    Anion gap 6 5 - 15    Comment: Performed at [facility], [place] [address], [place], [place] [address]  CBC with Differential ([place] Only)     Status: None   Collection Time: 11/19/20  2:32 PM  Result Value Ref Range   WBC Count 5.1 4.0 - 10.5 K/uL   RBC 4.49 3.87 - 5.11 MIL/uL   Hemoglobin 13.8 12.0 - 15.0 g/dL   HCT 42.4 36.0 - 46.0 %   MCV 94.4 80.0 - 100.0 fL   MCH 30.7 26.0 - 34.0 pg   MCHC 32.5 30.0 - 36.0 g/dL   RDW 12.3 11.5 - 15.5 %   Platelet Count 241 150 - 400 K/uL   nRBC 0.0 0.0 - 0.2 %   Neutrophils Relative % 48 %   Neutro Abs 2.4 1.7 -  7.7 K/uL   Lymphocytes Relative 39 %   Lymphs Abs 2.0 0.7 - 4.0 K/uL   Monocytes Relative 12 %   Monocytes Absolute 0.6 0.1 - 1.0 K/uL   Eosinophils Relative 1 %   Eosinophils Absolute 0.1 0.0 - 0.5 K/uL   Basophils Relative 0 %   Basophils Absolute 0.0 0.0 - 0.1 K/uL   Immature Granulocytes 0 %   Abs Immature Granulocytes 0.01 0.00 - 0.07 K/uL    Comment: Performed at [facility], [place] [address], [place], [place] [address]  Tissue transglutaminase, IgA     Status: None   Collection Time: 11/19/20  2:32 PM  Result Value Ref Range   Tissue Transglutaminase Ab, IgA <2 0 - 3 U/mL    Comment: (NOTE)                              Negative        0 -  3                              Weak Positive   4 - 10                              Positive           >10 Tissue Transglutaminase (tTG) has been identified as the endomysial antigen.  Studies have demonstr- ated that endomysial IgA antibodies have over 99% specificity for gluten sensitive enteropathy. Performed At: [facility]  [facility] [place], [place] [id number] Rush Farmer MD WU:[id number]      Psychiatric Specialty Exam: Physical Exam  Review of Systems  Weight 193 lb (87.5 kg), last menstrual period 11/28/2020.Body mass index is 29.35 kg/m.  General Appearance: NA  Eye Contact:  NA  Speech:  Clear and Coherent and Normal Rate  Volume:  Normal  Mood:  Anxious  Affect:  NA  Thought Process:  Goal Directed  Orientation:  Full (Time, Place, and Person)  Thought Content:  Rumination  Suicidal Thoughts:  No  Homicidal Thoughts:  No  Memory:  Immediate;   Good Recent;   Good Remote;   Good  Judgement:  Good  Insight:  Present  Psychomotor Activity:  NA  Concentration:  Concentration: Good and Attention Span: Good  Recall:  Good  Fund of Knowledge:  Good  Language:  Good  Akathisia:  No  Handed:  Right  AIMS (if indicated):     Assets:  Communication Skills Desire for  Improvement Housing Resilience Social Support Talents/Skills Transportation  ADL's:  Intact  Cognition:  WNL  Sleep:        Assessment and Plan: Major depressive disorder, recurrent.  Anxiety.  Insomnia.  Patient feels the current dose of Cymbalta and Ambien helping her a lot.  Despite having recently diagnosed with endometriosis and health issues she feels it is manageable.  Her anxiety is she like to keep the current condition.  Continue Ambien as needed for insomnia and Cymbalta 90 mg daily.  Discussed medication side effects and benefits.  Recommended to call us back if is any of any concern.   Follow Up Instructions:    I discussed the assessment and treatment plan with the patient. The patient was provided an opportunity to ask questions and all were answered. The patient agreed with the plan and demonstrated an understanding of the instructions.   The patient was advised to call back or seek an in-person evaluation if the symptoms worsen or if the condition fails to improve as anticipated.  I provided 15 minutes of non-face-to-face time during this encounter.   Kathlee Nations, MD

## 2020-12-27 ENCOUNTER — Ambulatory Visit (HOSPITAL_COMMUNITY): Payer: 59

## 2021-01-22 ENCOUNTER — Encounter: Payer: Self-pay | Admitting: Hematology and Oncology

## 2021-02-15 ENCOUNTER — Other Ambulatory Visit: Payer: Self-pay

## 2021-02-15 ENCOUNTER — Other Ambulatory Visit: Payer: Self-pay | Admitting: Nurse Practitioner

## 2021-02-15 ENCOUNTER — Ambulatory Visit
Admission: RE | Admit: 2021-02-15 | Discharge: 2021-02-15 | Disposition: A | Discharge status: Home / Self Care | Payer: Worker's Compensation | Source: Ambulatory Visit | Attending: Nurse Practitioner | Admitting: Nurse Practitioner

## 2021-02-15 DIAGNOSIS — M7989 Other specified soft tissue disorders: Secondary | ICD-10-CM

## 2021-02-15 DIAGNOSIS — M79672 Pain in left foot: Secondary | ICD-10-CM

## 2021-03-15 ENCOUNTER — Other Ambulatory Visit (HOSPITAL_COMMUNITY): Payer: Self-pay | Admitting: Psychiatry

## 2021-03-15 DIAGNOSIS — F330 Major depressive disorder, recurrent, mild: Secondary | ICD-10-CM

## 2021-03-15 DIAGNOSIS — F419 Anxiety disorder, unspecified: Secondary | ICD-10-CM

## 2021-03-20 ENCOUNTER — Other Ambulatory Visit: Payer: Self-pay

## 2021-03-20 ENCOUNTER — Telehealth (INDEPENDENT_AMBULATORY_CARE_PROVIDER_SITE_OTHER): Payer: 59 | Admitting: Psychiatry

## 2021-03-20 ENCOUNTER — Encounter (HOSPITAL_COMMUNITY): Payer: Self-pay | Admitting: Psychiatry

## 2021-03-20 VITALS — Wt 196.0 lb

## 2021-03-20 DIAGNOSIS — F330 Major depressive disorder, recurrent, mild: Secondary | ICD-10-CM | POA: Diagnosis not present

## 2021-03-20 DIAGNOSIS — F419 Anxiety disorder, unspecified: Secondary | ICD-10-CM

## 2021-03-20 DIAGNOSIS — F5101 Primary insomnia: Secondary | ICD-10-CM | POA: Diagnosis not present

## 2021-03-20 MED ORDER — DULOXETINE HCL 60 MG PO CPEP: 60.0000 mg | ORAL_CAPSULE | Freq: Every day | ORAL | 0 refills | Status: DC

## 2021-03-20 MED ORDER — ZOLPIDEM TARTRATE ER 12.5 MG PO TBCR: 12.5000 mg | EXTENDED_RELEASE_TABLET | Freq: Every evening | ORAL | 0 refills | Status: DC | PRN

## 2021-03-20 MED ORDER — DULOXETINE HCL 30 MG PO CPEP: ORAL_CAPSULE | ORAL | 0 refills | Status: DC

## 2021-03-20 NOTE — Progress Notes (Signed)
Virtual Visit via Telephone Note  I connected with Margaret Rojas on 03/20/21 at  2:00 PM EDT by telephone and verified that I am speaking with the correct person using two identifiers.  Location: Patient: work Provider: home office   I discussed the limitations, risks, security and privacy concerns of performing an evaluation and management service by telephone and the availability of in person appointments. I also discussed with the patient that there may be a patient responsible charge related to this service. The patient expressed understanding and agreed to proceed.   History of Present Illness: Patient is evaluated by phone session.  Patient reported for past few months having a hard time staying asleep.  She had a dose of Lupron to help the endometriosis.  Patient told it helped the pain but having hot flashes and restless at night.  She had decided not to continue Lupron anymore.  She also gained a few pounds since the last visit.  She feels her depression is a stable but sometimes feels anxious because she cannot sleep.  Her job is going well.  She is working in IT consultant.  She excited about upcoming beach trip with the family.  Patient taken few times Klonopin to help with anxiety.  She is taking Ambien but she does not feel it is helping as much.  She is taking the Flexeril and Nucynta as needed and she has a lot of pain.  She feels the Cymbalta helping her depression.  She has no tremors shakes or any EPS.   Past Psychiatric History:  H/O depression since 2005.  Took Lexapro while pregnant, Zoloft until switched to Effexor in November 2018 when diagnosed with breast cancer.  We switched to Cymbalta for better control on her chronic pain.  Tried Xanax and Lunesta (d/c metallic taste). No history of suicidal attempt or inpatient psychiatric treatment.  Psychiatric Specialty Exam: Physical Exam  Review of Systems  Weight 196 lb (88.9 kg).There is no height or weight on  file to calculate BMI.  General Appearance: NA  Eye Contact:  NA  Speech:  Clear and Coherent  Volume:  Normal  Mood:  Anxious  Affect:  NA  Thought Process:  Goal Directed  Orientation:  Full (Time, Place, and Person)  Thought Content:  Logical  Suicidal Thoughts:  No  Homicidal Thoughts:  No  Memory:  Immediate;   Good Recent;   Good Remote;   Good  Judgement:  Intact  Insight:  Present  Psychomotor Activity:  NA  Concentration:  Concentration: Good and Attention Span: Good  Recall:  Good  Fund of Knowledge:  Good  Language:  Good  Akathisia:  No  Handed:  Right  AIMS (if indicated):     Assets:  Communication Skills Desire for Improvement Housing Resilience Social Support Transportation  ADL's:  Intact  Cognition:  WNL  Sleep:   4 hrs      Assessment and Plan: Primary insomnia.  Anxiety.  Major depressive disorder, recurrent.  Discussed her insomnia.  Recommend to try Ambien CR 12.5 instead of regular Ambien for sleep maintenance.  Other possibilities are melatonin which she had tried in the past low-dose, trazodone but she believes it is short-term issues as she will not continue Lupron and hopefully it will be out of the system.  We will provide a 30-day supply of Ambien CR 12.5 however if patient do not feel any better then she can call us back.  Continue Cymbalta 90 mg daily.  Recommended to call us back if she has any question or any concern.  Follow-up in 3 months.   Follow Up Instructions:    I discussed the assessment and treatment plan with the patient. The patient was provided an opportunity to ask questions and all were answered. The patient agreed with the plan and demonstrated an understanding of the instructions.   The patient was advised to call back or seek an in-person evaluation if the symptoms worsen or if the condition fails to improve as anticipated.  I provided 18 minutes of non-face-to-face time during this encounter.   Kathlee Nations, MD

## 2021-03-22 ENCOUNTER — Ambulatory Visit
Admission: RE | Admit: 2021-03-22 | Discharge: 2021-03-22 | Disposition: A | Discharge status: Home / Self Care | Payer: 59 | Source: Ambulatory Visit | Attending: Hematology and Oncology | Admitting: Hematology and Oncology

## 2021-03-22 ENCOUNTER — Other Ambulatory Visit: Payer: Self-pay

## 2021-03-22 DIAGNOSIS — C50412 Malignant neoplasm of upper-outer quadrant of left female breast: Secondary | ICD-10-CM

## 2021-03-22 DIAGNOSIS — Z170 Estrogen receptor positive status [ER+]: Secondary | ICD-10-CM

## 2021-03-22 MED ORDER — GADOBUTROL 1 MMOL/ML IV SOLN
9.0000 mL | Freq: Once | INTRAVENOUS | Status: AC | PRN
  Administered 2021-03-22: 9 mL via INTRAVENOUS

## 2021-04-20 ENCOUNTER — Emergency Department (HOSPITAL_BASED_OUTPATIENT_CLINIC_OR_DEPARTMENT_OTHER): Payer: 59

## 2021-04-20 ENCOUNTER — Encounter (HOSPITAL_BASED_OUTPATIENT_CLINIC_OR_DEPARTMENT_OTHER): Payer: Self-pay | Admitting: *Deleted

## 2021-04-20 ENCOUNTER — Other Ambulatory Visit: Payer: Self-pay

## 2021-04-20 ENCOUNTER — Emergency Department (HOSPITAL_BASED_OUTPATIENT_CLINIC_OR_DEPARTMENT_OTHER)
Admission: EM | Admit: 2021-04-20 | Discharge: 2021-04-20 | Disposition: A | Discharge status: Home / Self Care | Payer: 59 | Attending: Emergency Medicine | Admitting: Emergency Medicine

## 2021-04-20 DIAGNOSIS — Z9013 Acquired absence of bilateral breasts and nipples: Secondary | ICD-10-CM | POA: Insufficient documentation

## 2021-04-20 DIAGNOSIS — S060X0A Concussion without loss of consciousness, initial encounter: Secondary | ICD-10-CM | POA: Diagnosis not present

## 2021-04-20 DIAGNOSIS — Z87891 Personal history of nicotine dependence: Secondary | ICD-10-CM | POA: Insufficient documentation

## 2021-04-20 DIAGNOSIS — S0990XA Unspecified injury of head, initial encounter: Secondary | ICD-10-CM

## 2021-04-20 DIAGNOSIS — W19XXXA Unspecified fall, initial encounter: Secondary | ICD-10-CM

## 2021-04-20 DIAGNOSIS — W010XXA Fall on same level from slipping, tripping and stumbling without subsequent striking against object, initial encounter: Secondary | ICD-10-CM | POA: Insufficient documentation

## 2021-04-20 DIAGNOSIS — Z853 Personal history of malignant neoplasm of breast: Secondary | ICD-10-CM | POA: Diagnosis not present

## 2021-04-20 MED ORDER — ONDANSETRON 4 MG PO TBDP: 4.0000 mg | ORAL_TABLET | Freq: Three times a day (TID) | ORAL | 1 refills | Status: DC | PRN

## 2021-04-20 NOTE — Discharge Instructions (Addendum)
Symptoms consistent with concussion.  Head CT without any skull fracture or any brain injury.  Brain rest is important.  Per tickly avoid any kind of screens.  Take the Zofran as needed for nausea vomiting.  Work note provided to be out of work for a week.  Make an appointment follow-up with your primary care doctor for recheck before going back to work.  You will most likely need to reschedule your colonoscopy scheduled for Monday.
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
## 2021-04-20 NOTE — ED Triage Notes (Signed)
Pt reports that she bent down to pick something off the floor and fell. Hit her left upper forehead on concrete. Slight abrasion noted. Denies LOC. Denies change to vision. Reports throbbing in her forehead. Reports nausea without vomiting.

## 2021-04-20 NOTE — ED Provider Notes (Signed)
Emergency Medicine Provider Triage Evaluation Note  Margaret Rojas , a 45 y.o. female  was evaluated in triage.  Pt complains of head trauma, she was reaching over to get her glasses and felt forward striking her head on the concrete.  No loss of conscious.  She has had some ongoing nausea and dizziness.  No other injuries.  Review of Systems  Positive: Headache, nausea, dizziness Negative: Vomiting  Physical Exam  BP 103/70 (BP Location: Left Arm)   Pulse 82   Temp 97.6 F (36.4 C) (Oral)   Resp 18   Ht '5\' 8"'$  (1.727 m)   Wt 86.2 kg   BMI 28.89 kg/m  Gen:   Awake, no distress    Resp:  Normal effort   MSK:   Moves extremities without difficulty   Other:     Medical Decision Making  Medically screening exam initiated at 12:23 PM.  Appropriate orders placed.  Kenja Polkinghorne Gill-Moffat was informed that the remainder of the evaluation will be completed by another provider, this initial triage assessment does not replace that evaluation, and the importance of remaining in the ED until their evaluation is complete.      Malvin Johns, MD 04/20/21 1224

## 2021-04-25 ENCOUNTER — Encounter: Payer: Self-pay | Admitting: Family Medicine

## 2021-04-25 ENCOUNTER — Ambulatory Visit: Payer: 59 | Admitting: Family Medicine

## 2021-04-25 ENCOUNTER — Other Ambulatory Visit: Payer: Self-pay

## 2021-04-25 DIAGNOSIS — S060X9A Concussion with loss of consciousness of unspecified duration, initial encounter: Secondary | ICD-10-CM | POA: Insufficient documentation

## 2021-04-25 DIAGNOSIS — S060XAA Concussion with loss of consciousness status unknown, initial encounter: Secondary | ICD-10-CM | POA: Insufficient documentation

## 2021-04-25 DIAGNOSIS — S060X0D Concussion without loss of consciousness, subsequent encounter: Secondary | ICD-10-CM

## 2021-04-25 NOTE — Assessment & Plan Note (Signed)
Pt hit head on 04/20/21 and went to ER for nausea/headache  Reviewed hospital records, lab results and studies in detail  Reassuring w/u incl exam and CT Followed through with brain rest until husband unexpectedly req hosp admit  Now overall improved headache and brain fog and resolved nausea  Was making better progress until she had to become active again  Looking for help so she can return to brain rest  Reassuring exam today  Watch carefully  S/s of subdural/head inj reviewed and ER parameters discussed  Handouts given  For now plan back to work wed for 1/2 days the first week as tolerated Update if not starting to improve in a week or if worsening

## 2021-04-25 NOTE — Patient Instructions (Signed)
Use ice on head if needed  Brain rest whenever you can  Stay out of work until Wednesday and then back 1/2 days for the first week (as tolerated)   If headache or brain fog worsen let us know  If new symptoms or if return of nausea or any dizziness or personality change also let us know  If severe -go to the ER   Update if not starting to improve in a week or if worsening
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
## 2021-05-09 ENCOUNTER — Other Ambulatory Visit (HOSPITAL_COMMUNITY): Payer: Self-pay | Admitting: *Deleted

## 2021-05-09 DIAGNOSIS — F330 Major depressive disorder, recurrent, mild: Secondary | ICD-10-CM

## 2021-05-09 DIAGNOSIS — F5101 Primary insomnia: Secondary | ICD-10-CM

## 2021-05-09 MED ORDER — ZOLPIDEM TARTRATE ER 12.5 MG PO TBCR: 12.5000 mg | EXTENDED_RELEASE_TABLET | Freq: Every evening | ORAL | 0 refills | Status: DC | PRN

## 2021-06-07 ENCOUNTER — Other Ambulatory Visit: Payer: Self-pay

## 2021-06-07 ENCOUNTER — Telehealth (INDEPENDENT_AMBULATORY_CARE_PROVIDER_SITE_OTHER): Payer: 59 | Admitting: Family Medicine

## 2021-06-07 ENCOUNTER — Encounter: Payer: Self-pay | Admitting: Family Medicine

## 2021-06-07 ENCOUNTER — Encounter: Payer: Self-pay | Admitting: Hematology and Oncology

## 2021-06-07 DIAGNOSIS — U071 COVID-19: Secondary | ICD-10-CM | POA: Insufficient documentation

## 2021-06-07 MED ORDER — NIRMATRELVIR/RITONAVIR (PAXLOVID)TABLET: 3.0000 | ORAL_TABLET | Freq: Two times a day (BID) | ORAL | 0 refills | Status: AC

## 2021-06-07 NOTE — Progress Notes (Signed)
Patient ID: Margaret Rojas, female    DOB: 1976/02/23, 45 y.o.   MRN: [id number]  Virtual visit completed through [place], a video enabled telemedicine application. Due to national recommendations of social distancing due to COVID-19, a virtual visit is felt to be most appropriate for this patient at this time. Reviewed limitations, risks, security and privacy concerns of performing a virtual visit and the availability of in person appointments. I also reviewed that there may be a patient responsible charge related to this service. The patient agreed to proceed.   Patient location: home Provider location: [place] at [facility], office Persons participating in this virtual visit: patient, provider   If any vitals were documented, they were collected by patient at home unless specified below.    Temp 97.7 F (36.5 C)   Ht 5\' 8"  (1.727 m)   Wt 194 lb (88 kg)   LMP  (Within Months) Comment: About 12/2020  BMI 29.50 kg/m    CC: COVID infection Subjective:   HPI: Margaret Rojas is a 45 y.o. female presenting on 06/07/2021 for Covid Positive (Pt had pos home COVID test 06/06/21.  C/o body aches, nasal congestion and clogged ears.  Sxs started 06/03/21.  Has had 1st Pfizer booster 08/2020. Wants to discuss tx options.  Concerned due to husband recently had lung collapse and pt is cancer survivor.  )   Recent trip to [facility] for conference.   First day of symptoms: 06/03/2021 Tested COVID positive: 06/06/2021  Current symptoms: body aches, predominant nasal congestion, clogged ears, fatigue, HA  No: fevers/chills, significant cough, loss of taste/smell, dyspnea, abd pain, nausea, diarrhea  Treatments to date: dayquil, nyquil, ibuprofen  Risk factors include: history of bilateral breast cancer 2018  Husband recovering from recent lung surgery for spontaneous PTX.   COVID vaccination status: Pfizer 11/2019, 12/2019, booster 08/2020      Relevant past medical, surgical, family and  social history reviewed and updated as indicated. Interim medical history since our last visit reviewed. Allergies and medications reviewed and updated. Outpatient Medications Prior to Visit  Medication Sig Dispense Refill   clonazePAM (KLONOPIN) 0.5 MG tablet Take half to 1 tablet as needed for severe anxiety. 20 tablet 0   cyclobenzaprine (FLEXERIL) 10 MG tablet Take 10 mg by mouth 2 (two) times daily as needed for muscle spasms (migraines).      DULoxetine (CYMBALTA) 30 MG capsule Take one capsule (30 mg total) by mouth daily. 90 capsule 0   DULoxetine (CYMBALTA) 60 MG capsule Take 1 capsule (60 mg total) by mouth daily. 90 capsule 0   lidocaine (LIDODERM) 5 % lidocaine 5 % topical patch     NUCYNTA 50 MG tablet TAKE 1 TABLET BY MOUTH EVERY 8 HOURS AS NEEDED FOR SEVERE PAIN     ondansetron (ZOFRAN ODT) 4 MG disintegrating tablet Take 1 tablet (4 mg total) by mouth every 8 (eight) hours as needed for nausea or vomiting. 12 tablet 1   tamoxifen (NOLVADEX) 20 MG tablet TAKE 1 TABLET BY MOUTH EVERY DAY 30 tablet 11   topiramate (TOPAMAX) 100 MG tablet Take 100 mg by mouth at bedtime.      zolpidem (AMBIEN CR) 12.5 MG CR tablet Take 1 tablet (12.5 mg total) by mouth at bedtime as needed for sleep. 30 tablet 0   No facility-administered medications prior to visit.     Per HPI unless specifically indicated in ROS section below Review of Systems Objective:  Temp 97.7 F (36.5 C)  Ht 5\' 8"  (1.727 m)   Wt 194 lb (88 kg)   LMP  (Within Months) Comment: About 12/2020  BMI 29.50 kg/m   Wt Readings from Last 3 Encounters:  06/07/21 194 lb (88 kg)  04/25/21 195 lb 9 oz (88.7 kg)  04/20/21 190 lb (86.2 kg)       Physical exam: Gen: alert, NAD, in bed, not acutely ill appearing but tired Pulm: speaks in complete sentences without increased work of breathing Psych: normal mood, normal thought content      Lab Results  Component Value Date   CREATININE 1.04 (H) 11/19/2020   BUN 17  11/19/2020   NA 138 11/19/2020   K 4.2 11/19/2020   CL 107 11/19/2020   CO2 25 11/19/2020   GFR = 67 Assessment & Plan:   Problem List Items Addressed This Visit     COVID-19 virus infection    Reviewed currently approved EUA treatments.  Reviewed expected course of illness, anticipated course of recovery, as well as red flags to suggest COVID pneumonia and/or to seek urgent in-person care. Reviewed latest CDC isolation/quarantine guidelines.  Encouraged fluids and rest. Reviewed further supportive care measures at home including vit C 500mg  bid, vit D 2000 IU daily, zinc 100mg  daily, tylenol PRN, pepcid 20mg  BID PRN.   Recommend:  She is overall feeling better in last 24 hours. She will continue home supportive care in isolation from husband, anticipate will recover well with this. Will provide WASP for full dose Paxlovid, she will only fill if significant deterioration over next 8 hours.  Paxlovid drug interactions:  1. Klonopin - rec 1/2 dose while on antiviral 2. lidoderm patch - avoid while on antiviral 3. Zolpidem - rec 1/2 dose while on antiviral      Relevant Medications   nirmatrelvir/ritonavir EUA (PAXLOVID) 20 x 150 MG & 10 x 100MG  TABS     Meds ordered this encounter  Medications   nirmatrelvir/ritonavir EUA (PAXLOVID) 20 x 150 MG & 10 x 100MG  TABS    Sig: Take 3 tablets by mouth 2 (two) times daily for 5 days. (Take nirmatrelvir 150 mg two tablets twice daily for 5 days and ritonavir 100 mg one tablet twice daily for 5 days) Patient GFR is 67    Dispense:  30 tablet    Refill:  0    No orders of the defined types were placed in this encounter.   I discussed the assessment and treatment plan with the patient. The patient was provided an opportunity to ask questions and all were answered. The patient agreed with the plan and demonstrated an understanding of the instructions. The patient was advised to call back or seek an in-person evaluation if the symptoms worsen or  if the condition fails to improve as anticipated.  Follow up plan: No follow-ups on file.  Ria Bush, MD

## 2021-06-07 NOTE — Telephone Encounter (Signed)
Spoke with Dr. Darnell Level and appt scheduled today a 4pm

## 2021-06-07 NOTE — Assessment & Plan Note (Addendum)
Reviewed currently approved EUA treatments.  Reviewed expected course of illness, anticipated course of recovery, as well as red flags to suggest COVID pneumonia and/or to seek urgent in-person care. Reviewed latest CDC isolation/quarantine guidelines.  Encouraged fluids and rest. Reviewed further supportive care measures at home including vit C 500mg  bid, vit D 2000 IU daily, zinc 100mg  daily, tylenol PRN, pepcid 20mg  BID PRN.   Recommend:  She is overall feeling better in last 24 hours. She will continue home supportive care in isolation from husband, anticipate will recover well with this. Will provide WASP for full dose Paxlovid, she will only fill if significant deterioration over next 8 hours.  Paxlovid drug interactions:  1. Klonopin - rec 1/2 dose while on antiviral 2. lidoderm patch - avoid while on antiviral 3. Zolpidem - rec 1/2 dose while on antiviral

## 2021-06-10 NOTE — Telephone Encounter (Signed)
Will route question to Dr. Darnell Level who did virtual appt for covid

## 2021-06-17 ENCOUNTER — Other Ambulatory Visit (HOSPITAL_COMMUNITY): Payer: Self-pay | Admitting: Psychiatry

## 2021-06-17 DIAGNOSIS — F419 Anxiety disorder, unspecified: Secondary | ICD-10-CM

## 2021-06-17 DIAGNOSIS — F330 Major depressive disorder, recurrent, mild: Secondary | ICD-10-CM

## 2021-06-18 ENCOUNTER — Other Ambulatory Visit: Payer: Self-pay | Admitting: Hematology and Oncology

## 2021-06-18 ENCOUNTER — Telehealth: Payer: Self-pay | Admitting: Hematology and Oncology

## 2021-06-18 NOTE — Telephone Encounter (Signed)
Per 10/4 sch msg, msg was left with pt

## 2021-06-20 ENCOUNTER — Telehealth (HOSPITAL_BASED_OUTPATIENT_CLINIC_OR_DEPARTMENT_OTHER): Payer: 59 | Admitting: Psychiatry

## 2021-06-20 ENCOUNTER — Encounter (HOSPITAL_COMMUNITY): Payer: Self-pay | Admitting: Psychiatry

## 2021-06-20 ENCOUNTER — Other Ambulatory Visit: Payer: Self-pay

## 2021-06-20 DIAGNOSIS — F330 Major depressive disorder, recurrent, mild: Secondary | ICD-10-CM

## 2021-06-20 DIAGNOSIS — F419 Anxiety disorder, unspecified: Secondary | ICD-10-CM

## 2021-06-20 DIAGNOSIS — F5101 Primary insomnia: Secondary | ICD-10-CM

## 2021-06-20 MED ORDER — DULOXETINE HCL 30 MG PO CPEP: ORAL_CAPSULE | ORAL | 0 refills | Status: DC

## 2021-06-20 MED ORDER — CLONAZEPAM 0.5 MG PO TABS: ORAL_TABLET | ORAL | 0 refills | Status: DC

## 2021-06-20 MED ORDER — ZOLPIDEM TARTRATE ER 12.5 MG PO TBCR: 12.5000 mg | EXTENDED_RELEASE_TABLET | Freq: Every evening | ORAL | 0 refills | Status: DC | PRN

## 2021-06-20 MED ORDER — DULOXETINE HCL 60 MG PO CPEP: 60.0000 mg | ORAL_CAPSULE | Freq: Every day | ORAL | 0 refills | Status: DC
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
## 2021-09-08 ENCOUNTER — Other Ambulatory Visit (HOSPITAL_COMMUNITY): Payer: Self-pay | Admitting: Psychiatry

## 2021-09-08 DIAGNOSIS — F419 Anxiety disorder, unspecified: Secondary | ICD-10-CM

## 2021-09-08 DIAGNOSIS — F330 Major depressive disorder, recurrent, mild: Secondary | ICD-10-CM

## 2021-09-19 ENCOUNTER — Telehealth (HOSPITAL_BASED_OUTPATIENT_CLINIC_OR_DEPARTMENT_OTHER): Payer: 59 | Admitting: Psychiatry

## 2021-09-19 ENCOUNTER — Encounter (HOSPITAL_COMMUNITY): Payer: Self-pay | Admitting: Psychiatry

## 2021-09-19 ENCOUNTER — Other Ambulatory Visit: Payer: Self-pay

## 2021-09-19 DIAGNOSIS — F419 Anxiety disorder, unspecified: Secondary | ICD-10-CM

## 2021-09-19 DIAGNOSIS — F330 Major depressive disorder, recurrent, mild: Secondary | ICD-10-CM | POA: Diagnosis not present

## 2021-09-19 DIAGNOSIS — F5101 Primary insomnia: Secondary | ICD-10-CM

## 2021-09-19 MED ORDER — DULOXETINE HCL 30 MG PO CPEP: ORAL_CAPSULE | ORAL | 0 refills | Status: DC

## 2021-09-19 MED ORDER — ZOLPIDEM TARTRATE ER 12.5 MG PO TBCR: 12.5000 mg | EXTENDED_RELEASE_TABLET | Freq: Every evening | ORAL | 0 refills | Status: DC | PRN

## 2021-09-19 MED ORDER — DULOXETINE HCL 60 MG PO CPEP: 60.0000 mg | ORAL_CAPSULE | Freq: Every day | ORAL | 0 refills | Status: DC

## 2021-09-19 NOTE — Progress Notes (Signed)
Virtual Visit via Video Note  I connected with Margaret Rojas on 09/19/21 at  2:20 PM EST by a video enabled telemedicine application and verified that I am speaking with the correct person using two identifiers.  Location: Patient: work Provider: Office   I discussed the limitations of evaluation and management by telemedicine and the availability of in person appointments. The patient expressed understanding and agreed to proceed.  History of Present Illness: Patient is evaluated by video session.  She is at work.  She has few days of around Christmas.  Overall she feels things are stable.  She still have anxiety but not as intense and denies any major panic attack.  She is trying to help her father who usually manages patient's mother who suffers from moderate dementia.  Patient is sleeping good.  She takes the Ambien CR every night that helps sleep.  Patient recently started to have neuropathy and tingling and prescribed gabapentin 300 mg at bedtime by her spinal surgeon.  Patient hoping that helps her tingling and neuropathy.  She had a good support from her friends.  She has not taken Klonopin lately but is still takes Cymbalta Ambien.  She also on Topamax.  Her job is going well and she is pleased she had a good support from her husband.  Her parents lives 2 hours away.  Her appetite is okay.  Her weight is unchanged.  She started walking every day and doing exercise before going to bed and that helps her sleep.  She is not watching TV late.  Patient does not want to change the medication.  Patient lives with her husband and 35 72 year old kids.   Past Psychiatric History:  H/O depression since 2005.  Took Lexapro while pregnant, Zoloft until switched to Effexor in November 2018 when diagnosed with breast cancer.  We switched to Cymbalta for better control on her chronic pain.  Tried Xanax and Lunesta (d/c metallic taste). No history of suicidal attempt or inpatient psychiatric  treatment.    Psychiatric Specialty Exam: Physical Exam  Review of Systems  Weight 192 lb (87.1 kg).There is no height or weight on file to calculate BMI.  General Appearance: Casual  Eye Contact:  Good  Speech:  Clear and Coherent  Volume:  Normal  Mood:  Anxious  Affect:  Appropriate  Thought Process:  Goal Directed  Orientation:  Full (Time, Place, and Person)  Thought Content:  Logical  Suicidal Thoughts:  No  Homicidal Thoughts:  No  Memory:  Immediate;   Good Recent;   Good Remote;   Good  Judgement:  Good  Insight:  Good  Psychomotor Activity:  Normal  Concentration:  Concentration: Good and Attention Span: Good  Recall:  Good  Fund of Knowledge:  Good  Language:  Good  Akathisia:  No  Handed:  Right  AIMS (if indicated):     Assets:  Communication Skills Desire for [place] Talents/Skills Transportation  ADL's:  Intact  Cognition:  WNL  Sleep:   ok      Assessment and Plan: Primary insomnia.  Anxiety.  Major depressive disorder, recurrent.  Patient doing better on the last visit.  She is also taking gabapentin 300 mg prescribed by spinal surgeon for neuropathy.  She is sleeping better.  Discussed medication side effects and benefits.  Continue Ambien CR 12.5 mg at bedtime, Klonopin 0.5 mg to take as needed though recently she has not taken it.  Continue Cymbalta 90 mg daily.  Recommended to call us back if she is any question or any concern.  Follow-up in 3 months.  Follow Up Instructions:    I discussed the assessment and treatment plan with the patient. The patient was provided an opportunity to ask questions and all were answered. The patient agreed with the plan and demonstrated an understanding of the instructions.   The patient was advised to call back or seek an in-person evaluation if the symptoms worsen or if the condition fails to improve as anticipated.  I provided 23 minutes of non-face-to-face time  during this encounter.   Kathlee Nations, MD

## 2021-10-02 ENCOUNTER — Other Ambulatory Visit: Payer: Self-pay | Admitting: Hematology and Oncology

## 2021-10-02 ENCOUNTER — Other Ambulatory Visit: Payer: Self-pay | Admitting: *Deleted

## 2021-10-02 ENCOUNTER — Encounter: Payer: Self-pay | Admitting: Hematology and Oncology

## 2021-10-02 DIAGNOSIS — C50412 Malignant neoplasm of upper-outer quadrant of left female breast: Secondary | ICD-10-CM

## 2021-10-02 DIAGNOSIS — Z1231 Encounter for screening mammogram for malignant neoplasm of breast: Secondary | ICD-10-CM

## 2021-10-10 ENCOUNTER — Other Ambulatory Visit: Payer: Self-pay | Admitting: Hematology and Oncology

## 2021-10-10 DIAGNOSIS — Z170 Estrogen receptor positive status [ER+]: Secondary | ICD-10-CM

## 2021-10-10 DIAGNOSIS — C50412 Malignant neoplasm of upper-outer quadrant of left female breast: Secondary | ICD-10-CM

## 2021-10-14 ENCOUNTER — Ambulatory Visit
Admission: RE | Admit: 2021-10-14 | Discharge: 2021-10-14 | Disposition: A | Discharge status: Home / Self Care | Payer: 59 | Source: Ambulatory Visit | Attending: Hematology and Oncology | Admitting: Hematology and Oncology

## 2021-10-14 DIAGNOSIS — C50412 Malignant neoplasm of upper-outer quadrant of left female breast: Secondary | ICD-10-CM

## 2021-10-14 DIAGNOSIS — Z170 Estrogen receptor positive status [ER+]: Secondary | ICD-10-CM
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
## 2021-11-21 ENCOUNTER — Other Ambulatory Visit: Payer: Self-pay

## 2021-11-21 ENCOUNTER — Inpatient Hospital Stay: Payer: 59 | Attending: Hematology and Oncology | Admitting: Hematology and Oncology

## 2021-11-21 DIAGNOSIS — C50412 Malignant neoplasm of upper-outer quadrant of left female breast: Secondary | ICD-10-CM | POA: Diagnosis not present

## 2021-11-21 DIAGNOSIS — M25551 Pain in right hip: Secondary | ICD-10-CM | POA: Diagnosis not present

## 2021-11-21 DIAGNOSIS — N951 Menopausal and female climacteric states: Secondary | ICD-10-CM | POA: Diagnosis not present

## 2021-11-21 DIAGNOSIS — Z170 Estrogen receptor positive status [ER+]: Secondary | ICD-10-CM | POA: Diagnosis not present

## 2021-11-21 DIAGNOSIS — Z79899 Other long term (current) drug therapy: Secondary | ICD-10-CM | POA: Insufficient documentation

## 2021-11-21 DIAGNOSIS — M25552 Pain in left hip: Secondary | ICD-10-CM | POA: Diagnosis not present

## 2021-11-21 DIAGNOSIS — Z79810 Long term (current) use of selective estrogen receptor modulators (SERMs): Secondary | ICD-10-CM | POA: Diagnosis not present

## 2021-11-21 MED ORDER — GABAPENTIN 300 MG PO CAPS: 300.0000 mg | ORAL_CAPSULE | Freq: Two times a day (BID) | ORAL | Status: DC

## 2021-11-21 MED ORDER — TAMOXIFEN CITRATE 20 MG PO TABS: 10.0000 mg | ORAL_TABLET | Freq: Every day | ORAL | 6 refills | Status: DC

## 2021-11-21 NOTE — Assessment & Plan Note (Signed)
06/19/2017:?Left lumpectomy: IDC with DCIS, 2.1 cm, 0/9 lymph nodes negative margins negative; ER 95%, PR 95%, HER-2 negative ratio 1.32, Ki-67 20%, T2N0 stage Ib;  ?Right lumpectomy: IDC with DCIS 1.8 cm, focally involving lateral and anterior margins, 0/3 lymph nodes negative,?reexcision anterior and lateral margins DCIS,?ER 100%, PR 20%, HER-2 negative ratio 1.23, Ki-67 3%, T1CN0 stage I a ?? ?Oncotype DX recurrence score 17: Risk of recurrence 11% with tamoxifen alone ?There was not enough material to send Oncotype on the right breast. ?Adjuvant radiation therapy 08/13/2017-09/29/2017 ?? ?Current treatment:?Tamoxifen 20 mg daily originally started February 2019?off for 6 weeks restarted April 2020 at 10 mg ?? ?Tamoxifen toxicities: ?1.??Hot flashes:?Cymbalta?appears to be helping significantly. ?2.??Bilateral hip pains:?better at 10 mg ?? ?Breast cancer surveillance: ?Left breast mammogram 10/14/2021: Benign breast density category C ?Breast exam 11/21/2021: Benign ? ?Abdominal pain discomfort: Due to endometriosis ?For spring break she plans to go to [facility] ?

## 2021-12-19 ENCOUNTER — Telehealth (HOSPITAL_BASED_OUTPATIENT_CLINIC_OR_DEPARTMENT_OTHER): Payer: 59 | Admitting: Psychiatry

## 2021-12-19 DIAGNOSIS — F419 Anxiety disorder, unspecified: Secondary | ICD-10-CM

## 2021-12-19 DIAGNOSIS — F5101 Primary insomnia: Secondary | ICD-10-CM | POA: Diagnosis not present

## 2021-12-19 DIAGNOSIS — F330 Major depressive disorder, recurrent, mild: Secondary | ICD-10-CM | POA: Diagnosis not present

## 2021-12-19 MED ORDER — DULOXETINE HCL 60 MG PO CPEP: 60.0000 mg | ORAL_CAPSULE | Freq: Every day | ORAL | 0 refills | Status: DC

## 2021-12-19 MED ORDER — CLONAZEPAM 0.5 MG PO TABS: ORAL_TABLET | ORAL | 0 refills | Status: DC

## 2021-12-19 MED ORDER — ZOLPIDEM TARTRATE ER 12.5 MG PO TBCR: 12.5000 mg | EXTENDED_RELEASE_TABLET | Freq: Every evening | ORAL | 0 refills | Status: DC | PRN

## 2021-12-19 MED ORDER — DULOXETINE HCL 30 MG PO CPEP: ORAL_CAPSULE | ORAL | 0 refills | Status: DC
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
## 2022-01-17 HISTORY — PX: ANTERIOR FUSION CERVICAL SPINE: SUR626

## 2022-03-21 ENCOUNTER — Telehealth (HOSPITAL_BASED_OUTPATIENT_CLINIC_OR_DEPARTMENT_OTHER): Payer: 59 | Admitting: Psychiatry

## 2022-03-21 ENCOUNTER — Encounter (HOSPITAL_COMMUNITY): Payer: Self-pay | Admitting: Psychiatry

## 2022-03-21 VITALS — Wt 181.0 lb

## 2022-03-21 DIAGNOSIS — F419 Anxiety disorder, unspecified: Secondary | ICD-10-CM

## 2022-03-21 DIAGNOSIS — F5101 Primary insomnia: Secondary | ICD-10-CM

## 2022-03-21 DIAGNOSIS — F330 Major depressive disorder, recurrent, mild: Secondary | ICD-10-CM

## 2022-03-21 MED ORDER — DULOXETINE HCL 30 MG PO CPEP: ORAL_CAPSULE | ORAL | 0 refills | Status: DC

## 2022-03-21 MED ORDER — DULOXETINE HCL 60 MG PO CPEP: 60.0000 mg | ORAL_CAPSULE | Freq: Every day | ORAL | 0 refills | Status: DC

## 2022-03-21 MED ORDER — ZOLPIDEM TARTRATE 10 MG PO TABS: 5.0000 mg | ORAL_TABLET | Freq: Every evening | ORAL | 0 refills | Status: DC | PRN

## 2022-03-21 MED ORDER — CLONAZEPAM 0.5 MG PO TABS: ORAL_TABLET | ORAL | 0 refills | Status: DC

## 2022-03-21 NOTE — Progress Notes (Addendum)
Virtual Visit via Video Note  I connected with Margaret Rojas on 03/21/22 at 10:40 AM EDT by a video enabled telemedicine application and verified that I am speaking with the correct person using two identifiers.  Location: Patient: Office Provider: Home Office   I discussed the limitations of evaluation and management by telemedicine and the availability of in person appointments. The patient expressed understanding and agreed to proceed.  History of Present Illness: Patient is evaluated by video session.  She had a spinal infusion few weeks ago and her pain usually is much better. Since procedure she started to pass out.  She had twice passed out and she is concerned.  She did talk to the pain doctor and she was told not to take narcotics pain medication.  She is not taking narcotic pain medication but is still taking Neurontin, Flexeril, tramadol.  She was advised if she continue to have dizziness then she should contact her primary care doctor.  Patient is started part-time work again and recently her job description change and that has been challenging.  She reported her symptoms are more or less stable and denies any crying spells or any feeling of hopelessness or worthlessness.  She had lost 20 pounds by doing intermittent fasting.  She cut down her carb and sugar intake.  She is taking Ambien CR which takes time to get him to sleep.  She like to go back on instant release Ambien if that can help her sleep better and she does not feel groggy next day.  She denies any panic attack.  She is in therapy with Elnora Morrison but realized that they have mutual friends and she is not comfortable and like to try a different therapist.  She is compliant with Cymbalta and occasionally take Klonopin when she feels very anxious about this.  She has no tremors, shakes or any EPS.  She denies any hallucination or any paranoia.  Her family life is going well.  Her kids are going to start camping and  patient family is going to [place] end of July.  Patient denies drinking or using any illegal substances.  Past Psychiatric History:  H/O depression since 2005.  Took Lexapro while pregnant, Zoloft until switched to Effexor in November 2018 when diagnosed with breast cancer.  We switched to Cymbalta for better control on her chronic pain.  Tried Xanax and Lunesta (d/c metallic taste). No history of suicidal attempt or inpatient psychiatric treatment.  Psychiatric Specialty Exam: Physical Exam  Review of Systems  Weight 181 lb (82.1 kg).There is no height or weight on file to calculate BMI.  General Appearance: Casual  Eye Contact:  Good  Speech:  Clear and Coherent  Volume:  Normal  Mood:  Anxious  Affect:  Congruent  Thought Process:  Goal Directed  Orientation:  Full (Time, Place, and Person)  Thought Content:  Logical  Suicidal Thoughts:  No  Homicidal Thoughts:  No  Memory:  Immediate;   Good Recent;   Good Remote;   Good  Judgement:  Good  Insight:  Present  Psychomotor Activity:  Normal  Concentration:  Concentration: Good and Attention Span: Good  Recall:  Good  Fund of Knowledge:  Good  Language:  Good  Akathisia:  No  Handed:  Right  AIMS (if indicated):     Assets:  Communication Skills Desire for [place] Talents/Skills Transportation  ADL's:  Intact  Cognition:  WNL  Sleep:  Assessment and Plan: Primary insomnia.  Anxiety.  Major depressive disorder, recurrent.  Discussed dizziness and pass out status post spinal fusion.  Patient not taking narcotic but is still taking Flexeril, gabapentin, Nucynta, tramadol along with Ambien, Cymbalta and occasionally Klonopin.  Discussed polypharmacy.  Recommend switching from Ambien CR to instant release Ambien to take 5-10 mg as needed for insomnia.  She had lost a lot of weight by intermittent fasting and she is feeling good about that.  Continue Cymbalta 90 mg daily.  I  recommend to take Klonopin only when she feels very nervous or anxious.  Recommended if continued to have passed out then need to contact primary care physician for further work-up.  May need further reduction of medication.  She is on Topamax, Flexeril, gabapentin, tramadol.  I will provide contact information of her new therapist Valora Piccolo for counseling.  Recommended to call us back if she has any question or any concern.  Follow-up in 3 months  Follow Up Instructions:    I discussed the assessment and treatment plan with the patient. The patient was provided an opportunity to ask questions and all were answered. The patient agreed with the plan and demonstrated an understanding of the instructions.   The patient was advised to call back or seek an in-person evaluation if the symptoms worsen or if the condition fails to improve as anticipated.  Collaboration of Care: Primary Care Provider AEB notes are available in epic to review.  Patient/Guardian was advised Release of Information must be obtained prior to any record release in order to collaborate their care with an outside provider. Patient/Guardian was advised if they have not already done so to contact the registration department to sign all necessary forms in order for [place] to release information regarding their care.   Consent: Patient/Guardian gives verbal consent for treatment and assignment of benefits for services provided during this visit. Patient/Guardian expressed understanding and agreed to proceed.    I provided 31 minutes of non-face-to-face time during this encounter.   Kathlee Nations, MD

## 2022-03-31 ENCOUNTER — Other Ambulatory Visit: Payer: Self-pay

## 2022-03-31 ENCOUNTER — Encounter (HOSPITAL_BASED_OUTPATIENT_CLINIC_OR_DEPARTMENT_OTHER): Payer: Self-pay

## 2022-03-31 ENCOUNTER — Emergency Department (HOSPITAL_BASED_OUTPATIENT_CLINIC_OR_DEPARTMENT_OTHER)
Admission: EM | Admit: 2022-03-31 | Discharge: 2022-04-01 | Disposition: A | Discharge status: Home / Self Care | Payer: 59 | Attending: Emergency Medicine | Admitting: Emergency Medicine

## 2022-03-31 ENCOUNTER — Telehealth: Payer: Self-pay | Admitting: Family Medicine

## 2022-03-31 DIAGNOSIS — R55 Syncope and collapse: Secondary | ICD-10-CM | POA: Insufficient documentation

## 2022-03-31 DIAGNOSIS — R42 Dizziness and giddiness: Secondary | ICD-10-CM | POA: Diagnosis present

## 2022-03-31 LAB — PREGNANCY, URINE: Preg Test, Ur: NEGATIVE

## 2022-03-31 LAB — CBC
HCT: 39.2 % (ref 36.0–46.0)
Hemoglobin: 12.9 g/dL (ref 12.0–15.0)
MCH: 30.9 pg (ref 26.0–34.0)
MCHC: 32.9 g/dL (ref 30.0–36.0)
MCV: 93.8 fL (ref 80.0–100.0)
Platelets: 265 10*3/uL (ref 150–400)
RBC: 4.18 MIL/uL (ref 3.87–5.11)
RDW: 13.2 % (ref 11.5–15.5)
WBC: 7.9 10*3/uL (ref 4.0–10.5)
nRBC: 0 % (ref 0.0–0.2)

## 2022-03-31 LAB — BASIC METABOLIC PANEL
Anion gap: 8 (ref 5–15)
BUN: 18 mg/dL (ref 6–20)
CO2: 24 mmol/L (ref 22–32)
Calcium: 9 mg/dL (ref 8.9–10.3)
Chloride: 106 mmol/L (ref 98–111)
Creatinine, Ser: 0.86 mg/dL (ref 0.44–1.00)
GFR, Estimated: >60 mL/min (ref >60)
Glucose, Bld: 90 mg/dL (ref 70–99)
Potassium: 3.3 mmol/L — ABNORMAL LOW (ref 3.5–5.1)
Sodium: 138 mmol/L (ref 135–145)

## 2022-03-31 LAB — URINALYSIS, ROUTINE W REFLEX MICROSCOPIC
Bilirubin Urine: NEGATIVE
Glucose, UA: NEGATIVE mg/dL
Hgb urine dipstick: NEGATIVE
Ketones, ur: NEGATIVE mg/dL
Leukocytes,Ua: NEGATIVE
Nitrite: NEGATIVE
Protein, ur: NEGATIVE mg/dL
Specific Gravity, Urine: 1.018 (ref 1.005–1.030)
pH: 5 (ref 5.0–8.0)

## 2022-03-31 NOTE — Telephone Encounter (Signed)
Patient called in stating she had a double neck fusion May 5th, and has since been experiencing dizziness, lightheadedness and has passed out. Transferred over to triage nurse.

## 2022-03-31 NOTE — ED Provider Notes (Signed)
[facility] Provider Note   CSN: [id number] Arrival date & time: 03/31/22  1807     History  Chief Complaint  Patient presents with   Dizziness    Margaret Rojas is a 46 y.o. female.  Patient is a 46 year old female with past medical history of cervical degenerative disc disease status post cervical fusion in May 2023.  Since the surgery, she reports experiencing episodes of dizziness, lightheadedness, and 2 episodes of syncope.  This seems to occur when she stands.  She describes a flushed sensation when standing, most of the time resulting in near syncope, but has experienced 2 episodes where she has lost consciousness.  Most notably an episode occurred on July 4 while she was at the pool during which time she completely blacked out.  She denies to me she is experiencing any palpitations, headaches, or loss of bowel or bladder continence.  She denies any recent fevers, chills, or GI symptoms.  She called her primary doctor who advised her to come to the ER for further evaluation.  She currently has no symptoms and has no complaints otherwise.  Patient does take multiple medications for pain and also has a history of breast cancer.  The history is provided by the patient.  Dizziness Quality:  Lightheadedness Severity:  Moderate Onset quality:  Sudden Duration:  2 months Timing:  Intermittent Progression:  Worsening Chronicity:  New Context: standing up   Relieved by:  Nothing Worsened by:  Nothing Ineffective treatments:  None tried      Home Medications Prior to Admission medications   Medication Sig Start Date End Date Taking? Authorizing Provider  clonazePAM (KLONOPIN) 0.5 MG tablet Take half to 1 tablet as needed for severe anxiety. 03/21/22   Arfeen, Arlyce Harman, MD  cyclobenzaprine (FLEXERIL) 10 MG tablet Take 10 mg by mouth 2 (two) times daily as needed for muscle spasms (migraines).    [provider]  DULoxetine (CYMBALTA) 30  MG capsule Take one capsule (30 mg total) by mouth daily. 03/21/22   Arfeen, Arlyce Harman, MD  DULoxetine (CYMBALTA) 60 MG capsule Take 1 capsule (60 mg total) by mouth daily. 03/21/22 03/21/23  Arfeen, Arlyce Harman, MD  gabapentin (NEURONTIN) 300 MG capsule Take 1 capsule (300 mg total) by mouth 2 (two) times daily. 11/21/21   Nicholas Lose, MD  NUCYNTA 50 MG tablet TAKE 1 TABLET BY MOUTH EVERY 8 HOURS AS NEEDED FOR SEVERE PAIN 12/29/18   [provider]  ondansetron (ZOFRAN ODT) 4 MG disintegrating tablet Take 1 tablet (4 mg total) by mouth every 8 (eight) hours as needed for nausea or vomiting. Patient not taking: Reported on 06/20/2021 04/20/21   Fredia Sorrow, MD  tamoxifen (NOLVADEX) 20 MG tablet Take 0.5 tablets (10 mg total) by mouth daily. 11/21/21   Nicholas Lose, MD  topiramate (TOPAMAX) 100 MG tablet Take 100 mg by mouth at bedtime.  11/26/14   [provider]  topiramate (TOPAMAX) 100 MG tablet Take 1 tablet by mouth daily.    [provider]  traMADol (ULTRAM) 50 MG tablet Take 50 mg by mouth every 6 (six) hours as needed. 02/25/22   [provider]  zolpidem (AMBIEN CR) 12.5 MG CR tablet Take 1 tablet (12.5 mg total) by mouth at bedtime as needed for sleep. Patient not taking: Reported on 03/21/2022 12/19/21 12/19/22  Arfeen, Arlyce Harman, MD  zolpidem (AMBIEN) 10 MG tablet Take 0.5-1 tablets (5-10 mg total) by mouth at bedtime as needed for sleep.  03/21/22   Arfeen, Arlyce Harman, MD      Allergies    Pneumovax [pneumococcal polysaccharide vaccine], Sulfonamide derivatives, and Epinephrine    Review of Systems   Review of Systems  Neurological:  Positive for dizziness.  All other systems reviewed and are negative.   Physical Exam Updated Vital Signs BP 111/75   Pulse 79   Temp 97.8 F (36.6 C) (Oral)   Resp 13   Ht '5\' 8"'$  (1.727 m)   Wt 82.6 kg   LMP  (LMP Unknown)   SpO2 100%   BMI 27.67 kg/m  Physical Exam Vitals and nursing note reviewed.  Constitutional:      General:  She is not in acute distress.    Appearance: She is well-developed. She is not diaphoretic.  HENT:     Head: Normocephalic and atraumatic.  Eyes:     Extraocular Movements: Extraocular movements intact.     Pupils: Pupils are equal, round, and reactive to light.  Cardiovascular:     Rate and Rhythm: Normal rate and regular rhythm.     Heart sounds: No murmur heard.    No friction rub. No gallop.  Pulmonary:     Effort: Pulmonary effort is normal. No respiratory distress.     Breath sounds: Normal breath sounds. No wheezing.  Abdominal:     General: Bowel sounds are normal. There is no distension.     Palpations: Abdomen is soft.     Tenderness: There is no abdominal tenderness.  Musculoskeletal:        General: Normal range of motion.     Cervical back: Normal range of motion and neck supple.  Skin:    General: Skin is warm and dry.  Neurological:     General: No focal deficit present.     Mental Status: She is alert and oriented to person, place, and time.     Cranial Nerves: No cranial nerve deficit.     Motor: No weakness.     Coordination: Coordination normal.     ED Results / Procedures / Treatments   Labs (all labs ordered are listed, but only abnormal results are displayed) Labs Reviewed  CBC  URINALYSIS, ROUTINE W REFLEX MICROSCOPIC  PREGNANCY, URINE  BASIC METABOLIC PANEL    EKG EKG Interpretation  Date/Time:  Monday March 31 2022 22:27:05 EDT Ventricular Rate:  72 PR Interval:  166 QRS Duration: 98 QT Interval:  453 QTC Calculation: 496 R Axis:   33 Text Interpretation: Sinus rhythm Minimal ST depression, inferior leads Borderline prolonged QT interval Confirmed by Veryl Speak [phone]) on 03/31/2022 11:20:11 PM  Radiology No results found.  Procedures Procedures  {Document cardiac monitor, telemetry assessment procedure when appropriate:1}  Medications Ordered in ED Medications - No data to display  ED Course/ Medical Decision Making/ A&P                            Medical Decision Making Amount and/or Complexity of Data Reviewed Labs: ordered. Radiology: ordered.   ***  {Document critical care time when appropriate:1} {Document review of labs and clinical decision tools ie heart score, Chads2Vasc2 etc:1}  {Document your independent review of radiology images, and any outside records:1} {Document your discussion with family members, caretakers, and with consultants:1} {Document social determinants of health affecting pt's care:1} {Document your decision making why or why not admission, treatments were needed:1} Final Clinical Impression(s) / ED Diagnoses Final diagnoses:  None  Rx / DC Orders ED Discharge Orders     None

## 2022-03-31 NOTE — Telephone Encounter (Signed)
I spoke with pt and she was just speaking with her husband because after pt passed out she has never had a seizure before. Pt said she is going to ED at [facility] for eval and any needed testing. Pt will cb on 04/01/22 with update on how she is doing and sending note to Dr Glori Bickers and [place] CMA.    [facility] [facility] - Client TELEPHONE ADVICE RECORD [facility] Patient Name: Margaret Rojas MOF FAT Gender: Female DOB: [birth year] Age: 46 Y 1 M 9 D Return Phone Number: [phone] (Primary), [phone] (Secondary) Address: City/ State/ Zip: [place]  [address] Client [facility] - Client Client Site [place] Provider Glori Bickers, Roque Lias - MD Contact Type Call Who Is Calling Patient / Member / Family / Caregiver Call Type Triage / Clinical Relationship To Patient Self Return Phone Number [phone] (Primary) Chief Complaint Dizziness Reason for Call Symptomatic / Request for [place] stated that she is having light headedness and dizziness and having a double neck fusion in May. Caller stating that she has passed out and people with her told her it looked like she may have had a seizure Caller was in an accident last week and EMS stated her BP was very low at that time. Translation No Nurse Assessment Nurse: Toribio Harbour, RN, Joelene Millin Date/Time (Eastern Time): 03/31/2022 4:34:25 PM Confirm and document reason for call. If symptomatic, describe symptoms. ---Caller stated that she is having light headedness and dizziness and having a double neck fusion in early May. Caller stating that she has passed out 10 days after surgery and people with her told her it looked like she may have been starting to have a seizure. She also passed out 7/4 when getting up from a low beach chair. Caller was in an accident last week and EMS stated her BP was very low at that time. Does the  patient have any new or worsening symptoms? ---Yes Will a triage be completed? ---Yes Related visit to physician within the last 2 weeks? ---No Does the PT have any chronic conditions? (i.e. diabetes, asthma, this includes High risk factors for pregnancy, etc.) ---Yes List chronic conditions. ---breast cancer, depression, anxiety, migraines, insomnia Is the patient pregnant or possibly pregnant? (Ask all females between the ages of 22-55) ---No Is this a behavioral health or substance abuse call? ---No PLEASE NOTE: All timestamps contained within this report are represented as [place] Standard Time. CONFIDENTIALTY NOTICE: This fax transmission is intended only for the addressee. It contains information that is legally privileged, confidential or otherwise protected from use or disclosure. If you are not the intended recipient, you are strictly prohibited from reviewing, disclosing, copying using or disseminating any of this information or taking any action in reliance on or regarding this information. If you have received this fax in error, please notify us immediately by telephone so that we can arrange for its return to [place]. Phone: [phone], Toll-Free: [phone], Fax: [phone] Page: 2 of 2 Call Id: [id number] Guidelines Guideline Title Affirmed Question Affirmed Notes Nurse Date/Time Eilene Ghazi Time) Dizziness - Lightheadedness Taking a medicine that could cause dizziness (e.g., blood pressure medications, diuretics) Toribio Harbour, RN, Joelene Millin 03/31/2022 4:39:42 PM Disp. Time Eilene Ghazi Time) Disposition Final User 03/31/2022 4:48:14 PM See PCP within 24 Hours Yes Toribio Harbour, RN, Joelene Millin Final Disposition 03/31/2022 4:48:14 PM See PCP within 24 Hours Yes Daves, RN, Renea Ee Disagree/Comply Comply Caller Understands Yes PreDisposition Did not know  what to do Care Advice Given Per Guideline SEE PCP WITHIN 24 HOURS: * IF OFFICE WILL BE OPEN: You need to be examined within the  next 24 hours. Call your doctor (or NP/PA) when the office opens and make an appointment. MEDICINE AS A CAUSE: * Your medicine may or may not be causing your dizziness. Your doctor can help you decide. * Drink several glasses of fruit juice, other clear fluids or water. * This will improve hydration and blood glucose. * If the weather is hot or you have a fever, make sure the fluids are cold. LIE DOWN AND REST: * Lie down with feet elevated for 1 hour. * This will improve circulation and increase blood flow to the brain. CALL BACK IF: * Passes out (faints) * You become worse CARE ADVICE given per Dizziness (Adult) guideline. Referrals REFERRED TO PCP OFFIC

## 2022-03-31 NOTE — ED Triage Notes (Signed)
Pt arrives POV from home with her husband.  Pt had a 2 level cervical fusion in May, since then has been feeling lightheaded, dizzy multiple times a day.  She has had 2 syncopal episodes, most recent was July 4th.  She called PCP for further direction and was instructed to come to ED.  Pt ambulatory to triage, in NAD.  GCS 15.

## 2022-03-31 NOTE — Telephone Encounter (Signed)
Agree with advisement for ER- I will watch for correspondence

## 2022-04-01 ENCOUNTER — Ambulatory Visit: Payer: 59 | Admitting: Family Medicine

## 2022-04-01 ENCOUNTER — Emergency Department (HOSPITAL_BASED_OUTPATIENT_CLINIC_OR_DEPARTMENT_OTHER): Payer: 59

## 2022-04-01 NOTE — Telephone Encounter (Signed)
Called and spoke with patient informed that she will go with advisement of ED. Pt said that she will follow up with cardoilogy

## 2022-04-01 NOTE — Telephone Encounter (Signed)
Patient called and stated she went to ER yesterday and also they did CT scan and bloodwork and everything came back normal and they are referring patient to a cardiologist. Call back number [phone].

## 2022-04-01 NOTE — ED Notes (Addendum)
Pt verbalizes understanding of discharge instructions. Opportunity for questioning and answers were provided. Pt discharged from ED to home with husband. Declined discharge vitals

## 2022-04-01 NOTE — Discharge Instructions (Signed)
Drink plenty of fluids and get plenty of rest.  Follow-up with your primary doctor in the next week.  A referral has been placed to cardiology to schedule a follow-up appointment.  Return to the ER if symptoms significantly worsen or change.

## 2022-05-01 ENCOUNTER — Other Ambulatory Visit: Payer: Self-pay | Admitting: Obstetrics and Gynecology

## 2022-05-10 ENCOUNTER — Other Ambulatory Visit (HOSPITAL_COMMUNITY): Payer: Self-pay | Admitting: Psychiatry

## 2022-05-14 ENCOUNTER — Telehealth (HOSPITAL_COMMUNITY): Payer: Self-pay

## 2022-05-14 MED ORDER — ZOLPIDEM TARTRATE 10 MG PO TABS: 5.0000 mg | ORAL_TABLET | Freq: Every evening | ORAL | 0 refills | Status: DC | PRN

## 2022-05-14 NOTE — Telephone Encounter (Signed)
Please call the patient it was send to pharmacy.

## 2022-05-15 ENCOUNTER — Other Ambulatory Visit: Payer: Self-pay | Admitting: *Deleted

## 2022-05-15 ENCOUNTER — Encounter: Payer: Self-pay | Admitting: Hematology and Oncology

## 2022-05-15 DIAGNOSIS — Z170 Estrogen receptor positive status [ER+]: Secondary | ICD-10-CM

## 2022-05-15 NOTE — Progress Notes (Signed)
Due to thickened endometrial lining and atypical hyperplasia seen on recent endometrial biopsy, MD recommends CT abdomen pelvis for evaluation of possible metastatic disease.  Orders placed

## 2022-05-22 ENCOUNTER — Ambulatory Visit (HOSPITAL_COMMUNITY)
Admission: RE | Admit: 2022-05-22 | Discharge: 2022-05-22 | Disposition: A | Discharge status: Home / Self Care | Payer: 59 | Source: Ambulatory Visit | Attending: Hematology and Oncology | Admitting: Hematology and Oncology

## 2022-05-22 DIAGNOSIS — Z170 Estrogen receptor positive status [ER+]: Secondary | ICD-10-CM | POA: Diagnosis present

## 2022-05-22 DIAGNOSIS — C50412 Malignant neoplasm of upper-outer quadrant of left female breast: Secondary | ICD-10-CM | POA: Diagnosis present

## 2022-05-22 MED ORDER — SODIUM CHLORIDE (PF) 0.9 % IJ SOLN
INTRAMUSCULAR | Status: AC
  Filled 2022-05-22: qty 50

## 2022-05-22 MED ORDER — IOHEXOL 300 MG/ML  SOLN
100.0000 mL | Freq: Once | INTRAMUSCULAR | Status: AC | PRN
  Administered 2022-05-22: 100 mL via INTRAVENOUS

## 2022-05-23 ENCOUNTER — Encounter: Payer: Self-pay | Admitting: *Deleted

## 2022-05-23 NOTE — Progress Notes (Signed)
Per MD request, RN placed call to pt regarding results of recent CT abd/pevlis being negative for metastatic disease.  Pt verbalized understanding.  Pt states she is scheduled to see OBGYN Dr. Mancel Bale with [facility] 05/28/22 and requesting copy of CT be faxed to their office.  RN successfully faxed results [phone].

## 2022-06-13 ENCOUNTER — Other Ambulatory Visit (HOSPITAL_COMMUNITY): Payer: Self-pay | Admitting: Psychiatry

## 2022-06-13 DIAGNOSIS — F419 Anxiety disorder, unspecified: Secondary | ICD-10-CM

## 2022-06-13 DIAGNOSIS — F330 Major depressive disorder, recurrent, mild: Secondary | ICD-10-CM

## 2022-06-19 ENCOUNTER — Encounter: Payer: Self-pay | Admitting: Hematology and Oncology

## 2022-06-20 ENCOUNTER — Telehealth (HOSPITAL_BASED_OUTPATIENT_CLINIC_OR_DEPARTMENT_OTHER): Payer: 59 | Admitting: Psychiatry

## 2022-06-20 ENCOUNTER — Encounter (HOSPITAL_COMMUNITY): Payer: Self-pay | Admitting: Psychiatry

## 2022-06-20 VITALS — Wt 182.0 lb

## 2022-06-20 DIAGNOSIS — F419 Anxiety disorder, unspecified: Secondary | ICD-10-CM | POA: Diagnosis not present

## 2022-06-20 DIAGNOSIS — F5101 Primary insomnia: Secondary | ICD-10-CM

## 2022-06-20 DIAGNOSIS — F330 Major depressive disorder, recurrent, mild: Secondary | ICD-10-CM | POA: Diagnosis not present

## 2022-06-20 MED ORDER — DULOXETINE HCL 30 MG PO CPEP: ORAL_CAPSULE | ORAL | 0 refills | Status: DC

## 2022-06-20 MED ORDER — DULOXETINE HCL 60 MG PO CPEP: 60.0000 mg | ORAL_CAPSULE | Freq: Every day | ORAL | 0 refills | Status: DC

## 2022-06-20 MED ORDER — ZOLPIDEM TARTRATE 10 MG PO TABS: 5.0000 mg | ORAL_TABLET | Freq: Every evening | ORAL | 2 refills | Status: DC | PRN

## 2022-06-20 NOTE — Progress Notes (Signed)
Virtual Visit via Video Note  I connected with Margaret Rojas on 06/20/22 at 10:40 AM EDT by a video enabled telemedicine application and verified that I am speaking with the correct person using two identifiers.  Location: Patient: Work Provider: Biomedical scientist   I discussed the limitations of evaluation and management by telemedicine and the availability of in person appointments. The patient expressed understanding and agreed to proceed.  History of Present Illness: Patient is evaluated by video session.  She is doing better on her current medication.  She is sleeping better with the regular Ambien.  On the weekend she tried to cut down the dose and to take half tablet.  Patient told her husband lost her job and August and there is some stress but she is trying to handle better.  She started walking 2-3 times a day.  Her weight is stable.  Her water intake is good and she is doing intermittent fasting.  Her back pain is much better and she has only 1 panic attack a few weeks ago.  Recently she was told she may need hysterectomy and surgery is going to be scheduled in mid November because of few cancerous cells.  Patient reported some anxiety with the news but able to manage and getting ready for surgery.  She is thinking to consider EMDR because sometimes she talks about her past trauma related to her surgeries.  We have recommended therapy in the past however her therapist happened to know the mutual friends and she decided not to pursue.  Patient is taking Cymbalta and Ambien.  She rarely takes Klonopin and does not need any refills.  She has no more dizziness after the procedure.  Her job is challenging but manageable.  She has no tremors, shakes.   Past Psychiatric History:  H/O depression since 2005.  Took Lexapro while pregnant, Zoloft until switched to Effexor in November 2018 when diagnosed with breast cancer.  We switched to Cymbalta for better control on her chronic pain.  Tried  Xanax and Lunesta (d/c metallic taste). No history of suicidal attempt or inpatient psychiatric treatment.   Psychiatric Specialty Exam: Physical Exam  Review of Systems  Weight 182 lb (82.6 kg).There is no height or weight on file to calculate BMI.  General Appearance: Casual  Eye Contact:  Good  Speech:  Clear and Coherent and Normal Rate  Volume:  Normal  Mood:  Euthymic  Affect:  Appropriate  Thought Process:  Goal Directed  Orientation:  Full (Time, Place, and Person)  Thought Content:  WDL  Suicidal Thoughts:  No  Homicidal Thoughts:  No  Memory:  Immediate;   Good Recent;   Good Remote;   Good  Judgement:  Good  Insight:  Good  Psychomotor Activity:  Normal  Concentration:  Concentration: Good and Attention Span: Good  Recall:  Good  Fund of Knowledge:  Good  Language:  Good  Akathisia:  No  Handed:  Right  AIMS (if indicated):     Assets:  Communication Skills Desire for [place] Talents/Skills Transportation  ADL's:  Intact  Cognition:  WNL  Sleep:   good with Ambien       Assessment and Plan: Primary insomnia.  Anxiety.  Major depressive disorder, rule current.  Patient doing better with the regular Ambien.  She takes 10 mg but on the weekends she tried to cut down to take half tablet.  Her sleep is improved.  Discussed EMDR as patient like to  discuss about her past trauma in therapy when she was going through surgeries.  She has upcoming surgery in mid November for hysterectomy.  I have provided few names that she can contact them.  She does not need the Klonopin as she takes rarely.  Continue Cymbalta 90 mg daily and Ambien 10 mg to take as needed.  Patient is also taking Topamax, Flexeril, gabapentin and tramadol.  Recommended to call us back if she has any question or any concern.  Follow-up in 3 months.  Follow Up Instructions:    I discussed the assessment and treatment plan with the patient. The patient was  provided an opportunity to ask questions and all were answered. The patient agreed with the plan and demonstrated an understanding of the instructions.   The patient was advised to call back or seek an in-person evaluation if the symptoms worsen or if the condition fails to improve as anticipated.  Collaboration of Care: Primary Care Provider AEB notes are available in epic to review.  Patient/Guardian was advised Release of Information must be obtained prior to any record release in order to collaborate their care with an outside provider. Patient/Guardian was advised if they have not already done so to contact the registration department to sign all necessary forms in order for [place] to release information regarding their care.   Consent: Patient/Guardian gives verbal consent for treatment and assignment of benefits for services provided during this visit. Patient/Guardian expressed understanding and agreed to proceed.    I provided 21 minutes of non-face-to-face time during this encounter.   Kathlee Nations, MD

## 2022-06-23 ENCOUNTER — Ambulatory Visit: Admit: 2022-06-23 | Payer: 59 | Admitting: Obstetrics and Gynecology

## 2022-06-23 SURGERY — HYSTERECTOMY, TOTAL, LAPAROSCOPIC, WITH BILATERAL SALPINGO-OOPHORECTOMY
Anesthesia: General | Laterality: Bilateral

## 2022-07-01 ENCOUNTER — Other Ambulatory Visit: Payer: Self-pay | Admitting: Obstetrics and Gynecology

## 2022-09-02 ENCOUNTER — Other Ambulatory Visit (HOSPITAL_COMMUNITY): Payer: Self-pay

## 2022-09-08 ENCOUNTER — Other Ambulatory Visit (HOSPITAL_COMMUNITY): Payer: Self-pay | Admitting: Psychiatry

## 2022-09-08 DIAGNOSIS — F330 Major depressive disorder, recurrent, mild: Secondary | ICD-10-CM

## 2022-09-08 DIAGNOSIS — F419 Anxiety disorder, unspecified: Secondary | ICD-10-CM

## 2022-09-10 NOTE — Telephone Encounter (Signed)
Patient with upcoming appointment on 09/19/22. PDMP reviewed as well as prior notes. Klonopin with appropriate fill history though she is on concurrent tramadol as well as ambien. Will provide short supply to last until primary psychiatrist returns. Similar with cymbalta.

## 2022-09-11 ENCOUNTER — Ambulatory Visit: Admit: 2022-09-11 | Payer: Self-pay | Admitting: Obstetrics and Gynecology

## 2022-09-11 SURGERY — HYSTERECTOMY, VAGINAL, LAPAROSCOPY-ASSISTED, WITH SALPINGECTOMY
Anesthesia: General

## 2022-09-16 NOTE — Pre-Procedure Instructions (Signed)
Surgical Instructions    Your procedure is scheduled on Monday, January 8.  Report to [facility] Main Entrance "A" at 6:45 A.M., then check in with the Admitting office.  Call this number if you have problems the morning of surgery:  [phone]   If you have any questions prior to your surgery date call [phone]: Open Monday-Friday 8am-4pm If you experience any cold or flu symptoms such as cough, fever, chills, shortness of breath, etc. between now and your scheduled surgery, please notify us at the above number     Remember:  Do not eat after midnight the night before your surgery  You may drink clear liquids until 5:45AM the morning of your surgery.   Clear liquids allowed are: Water, Non-Citrus Juices (without pulp), Carbonated Beverages, Clear Tea, Black Coffee ONLY (NO MILK, CREAM OR POWDERED CREAMER of any kind), and Gatorade    Take these medicines the morning of surgery with A SIP OF WATER: DULoxetine (CYMBALTA)  gabapentin (NEURONTIN)   clonazePAM (KLONOPIN)  if needed  cyclobenzaprine (FLEXERIL)  if needed traMADol (ULTRAM)  if needed   As of today, STOP taking any Aspirin (unless otherwise instructed by your surgeon) Aleve, Naproxen, Ibuprofen, Motrin, Advil, Goody's, BC's, all herbal medications, fish oil, and all vitamins.             [place] is not responsible for any belongings or valuables.    Do NOT Smoke (Tobacco/Vaping)  24 hours prior to your procedure  If you use a CPAP at night, you may bring your mask for your overnight stay.   Contacts, glasses, hearing aids, dentures or partials may not be worn into surgery, please bring cases for these belongings   For patients admitted to the hospital, discharge time will be determined by your treatment team.   Patients discharged the day of surgery will not be allowed to drive home, and someone needs to stay with them for 24 hours.   SURGICAL WAITING ROOM VISITATION Patients having surgery or a  procedure may have no more than 2 support people in the waiting area - these visitors may rotate.   Children under the age of 48 must have an adult with them who is not the patient. If the patient needs to stay at the hospital during part of their recovery, the visitor guidelines for inpatient rooms apply. Pre-op nurse will coordinate an appropriate time for 1 support person to accompany patient in pre-op.  This support person may not rotate.   Please refer to [email or web address] for the visitor guidelines for Inpatients (after your surgery is over and you are in a regular room).    Special instructions:    Oral Hygiene is also important to reduce your risk of infection.  Remember - BRUSH YOUR TEETH THE MORNING OF SURGERY WITH YOUR REGULAR TOOTHPASTE   [place]- Preparing For Surgery  Before surgery, you can play an important role. Because skin is not sterile, your skin needs to be as free of germs as possible. You can reduce the number of germs on your skin by washing with CHG (chlorahexidine gluconate) Soap before surgery.  CHG is an antiseptic cleaner which kills germs and bonds with the skin to continue killing germs even after washing.     Please do not use if you have an allergy to CHG or antibacterial soaps. If your skin becomes reddened/irritated stop using the CHG.  Do not shave (including legs and underarms) for at least 48 hours prior to first CHG  shower. It is OK to shave your face.  Please follow these instructions carefully.     Shower the NIGHT BEFORE SURGERY and the MORNING OF SURGERY with CHG Soap.   If you chose to wash your hair, wash your hair first as usual with your normal shampoo. After you shampoo, rinse your hair and body thoroughly to remove the shampoo.  Then [employer] and genitals (private parts) with your normal soap and rinse thoroughly to remove soap.  After that Use CHG Soap as you would any other liquid  soap. You can apply CHG directly to the skin and wash gently with a scrungie or a clean washcloth.   Apply the CHG Soap to your body ONLY FROM THE NECK DOWN.  Do not use on open wounds or open sores. Avoid contact with your eyes, ears, mouth and genitals (private parts). Wash Face and genitals (private parts)  with your normal soap.   Wash thoroughly, paying special attention to the area where your surgery will be performed.  Thoroughly rinse your body with warm water from the neck down.  DO NOT shower/wash with your normal soap after using and rinsing off the CHG Soap.  Pat yourself dry with a CLEAN TOWEL.  Wear CLEAN PAJAMAS to bed the night before surgery  Place CLEAN SHEETS on your bed the night before your surgery  DO NOT SLEEP WITH PETS.   Day of Surgery:  Take a shower with CHG soap. Wear Clean/Comfortable clothing the morning of surgery Do not wear jewelry or makeup. Do not wear lotions, powders, perfumes/cologne or deodorant. Do not shave 48 hours prior to surgery.  Men may shave face and neck. Do not bring valuables to the hospital. Do not wear nail polish, gel polish, artificial nails, or any other type of covering on natural nails (fingers and toes) If you have artificial nails or gel coating that need to be removed by a nail salon, please have this removed prior to surgery. Artificial nails or gel coating may interfere with anesthesia's ability to adequately monitor your vital signs.  Remember to brush your teeth WITH YOUR REGULAR TOOTHPASTE.    If you received a COVID test during your pre-op visit, it is requested that you wear a mask when out in public, stay away from anyone that may not be feeling well, and notify your surgeon if you develop symptoms. If you have been in contact with anyone that has tested positive in the last 10 days, please notify your surgeon.    Please read over the following fact sheets that you were given.

## 2022-09-17 ENCOUNTER — Other Ambulatory Visit: Payer: Self-pay

## 2022-09-17 ENCOUNTER — Encounter (HOSPITAL_COMMUNITY): Payer: Self-pay

## 2022-09-17 ENCOUNTER — Encounter (HOSPITAL_COMMUNITY)
Admission: RE | Admit: 2022-09-17 | Discharge: 2022-09-17 | Disposition: A | Discharge status: Home / Self Care | Payer: 59 | Source: Ambulatory Visit | Attending: Obstetrics and Gynecology | Admitting: Obstetrics and Gynecology

## 2022-09-17 VITALS — BP 108/78 | HR 87 | Temp 97.8°F | Resp 18 | Ht 68.0 in | Wt 190.1 lb

## 2022-09-17 DIAGNOSIS — Z01818 Encounter for other preprocedural examination: Secondary | ICD-10-CM

## 2022-09-17 DIAGNOSIS — Z01812 Encounter for preprocedural laboratory examination: Secondary | ICD-10-CM | POA: Insufficient documentation

## 2022-09-17 HISTORY — DX: Pneumonia, unspecified organism: J18.9

## 2022-09-17 LAB — BASIC METABOLIC PANEL
Anion gap: 8 (ref 5–15)
BUN: 14 mg/dL (ref 6–20)
CO2: 23 mmol/L (ref 22–32)
Calcium: 8.5 mg/dL — ABNORMAL LOW (ref 8.9–10.3)
Chloride: 105 mmol/L (ref 98–111)
Creatinine, Ser: 0.88 mg/dL (ref 0.44–1.00)
GFR, Estimated: >60 mL/min (ref >60)
Glucose, Bld: 92 mg/dL (ref 70–99)
Potassium: 3.4 mmol/L — ABNORMAL LOW (ref 3.5–5.1)
Sodium: 136 mmol/L (ref 135–145)

## 2022-09-17 LAB — TYPE AND SCREEN
ABO/RH(D): A NEG
Antibody Screen: NEGATIVE

## 2022-09-17 LAB — CBC
HCT: 43.3 % (ref 36.0–46.0)
Hemoglobin: 13.5 g/dL (ref 12.0–15.0)
MCH: 30.3 pg (ref 26.0–34.0)
MCHC: 31.2 g/dL (ref 30.0–36.0)
MCV: 97.3 fL (ref 80.0–100.0)
Platelets: 247 10*3/uL (ref 150–400)
RBC: 4.45 MIL/uL (ref 3.87–5.11)
RDW: 12.8 % (ref 11.5–15.5)
WBC: 6 10*3/uL (ref 4.0–10.5)
nRBC: 0 % (ref 0.0–0.2)

## 2022-09-17 NOTE — Progress Notes (Signed)
PCP - [employer] Cardiologist - denies cardiologist or cardiac workup.   PPM/ICD - denies   Chest x-ray - N/A EKG - 04/02/22 Stress Test - denies ECHO - denies Cardiac Cath - denies  Sleep Study - denies   Fasting Blood Sugar - N/A  Last dose of GLP1 agonist-  N/A  Blood Thinner Instructions: N/A Aspirin Instructions:N/A  ERAS Protcol - ERAS per protocol, no orders from MD  COVID TEST-N/A pt reports having positive Covid test October 24/25 2023.   Anesthesia review: yes, pt in ED in July for syncopal episodes. Pt reports she had Neck surgery in May, and had not been eating/drinking well at the time. Pt reports she had also been taking a muscle relaxer. Pt reports that she was told that the ED MD recommended that she see a cardiologist. Pt reports she has not had any issues since then.  Pt reports she has had lymph nodes removed with bilateral lumpectomies. Pt states that she gets lymphedema in the left arm. Pt states she was told that she should restrict both arms, but she has had BPs, lab draws and IV sticks to the right arm without issue.   Patient denies shortness of breath, fever, cough and chest pain at PAT appointment   All instructions explained to the patient, with a verbal understanding of the material. Patient agrees to go over the instructions while at home for a better understanding. The opportunity to ask questions was provided.

## 2022-09-18 ENCOUNTER — Encounter (HOSPITAL_COMMUNITY): Payer: Self-pay | Admitting: Physician Assistant

## 2022-09-18 ENCOUNTER — Telehealth: Payer: Self-pay | Admitting: *Deleted

## 2022-09-18 NOTE — Telephone Encounter (Signed)
Received call from [name] from Anesthesia with [facility]. Pt is suppose to have surgery on 09/22/22. However pt went to ER for dizziness on 03/31/22 and per ER notes pt was suppose to f/u with PCP and cardiology after appt. [name] didn't see any records of [place] seeing pt after ER visit, or cardiology appt. He is questioning if he should proceed with surgery/anesthesia given the fact that she hasn't been seen. Per PCP we can't clear her for surgery since she never f/u with [place] or cardiology after ER visit and we haven't seen her since 04/25/21. [name] said he will call pt's surgeon and let them know he and PCP are recommending postponing surgery until she is cleared.  Called pt and advised her of this info and surgical clearance appt scheduled with PCP on 09/22/22 at 12pm.

## 2022-09-19 ENCOUNTER — Telehealth (HOSPITAL_COMMUNITY): Payer: 59 | Admitting: Psychiatry

## 2022-09-19 ENCOUNTER — Encounter (HOSPITAL_COMMUNITY): Payer: Self-pay | Admitting: Psychiatry

## 2022-09-19 VITALS — Wt 190.0 lb

## 2022-09-19 DIAGNOSIS — F330 Major depressive disorder, recurrent, mild: Secondary | ICD-10-CM

## 2022-09-19 DIAGNOSIS — F5101 Primary insomnia: Secondary | ICD-10-CM

## 2022-09-19 DIAGNOSIS — F419 Anxiety disorder, unspecified: Secondary | ICD-10-CM | POA: Diagnosis not present

## 2022-09-19 MED ORDER — CLONAZEPAM 0.5 MG PO TABS: 0.5000 mg | ORAL_TABLET | ORAL | 0 refills | Status: DC | PRN

## 2022-09-19 MED ORDER — ZOLPIDEM TARTRATE 10 MG PO TABS: 5.0000 mg | ORAL_TABLET | Freq: Every evening | ORAL | 2 refills | Status: DC | PRN

## 2022-09-19 MED ORDER — DULOXETINE HCL 30 MG PO CPEP: 30.0000 mg | ORAL_CAPSULE | ORAL | 0 refills | Status: DC

## 2022-09-19 MED ORDER — DULOXETINE HCL 60 MG PO CPEP: 60.0000 mg | ORAL_CAPSULE | Freq: Every day | ORAL | 0 refills | Status: DC

## 2022-09-19 NOTE — Progress Notes (Signed)
Virtual Visit via Video Note  I connected with Margaret Rojas on 09/19/22 at 11:00 AM EST by a video enabled telemedicine application and verified that I am speaking with the correct person using two identifiers.  Location: Patient: In Car Provider: Home Office   I discussed the limitations of evaluation and management by telemedicine and the availability of in person appointments. The patient expressed understanding and agreed to proceed.  History of Present Illness: Patient is evaluated by video session.  She is in the car.  She is frustrated because her hysterectomy is postponed because anesthesiologist wants her clearance.  Apparently patient had passed out on her last neck surgery.  Patient is scheduled to have surgery this Monday and yesterday she find out the need to clearance.  Patient did not sleep last night but overall she feels things are going well.  She has to take Klonopin yesterday to calm down and walk for 45 minutes.  Now she need to call her primary care physician to get her EKG and other requirements from the anesthesiologist so she can go for the surgery.  She is not sure how long it will delay.  Overall she feels the combination of Cymbalta and Ambien working well.  She has no tremors, shakes or any EPS.  We have referred her for EMDR for trauma but she was thinking that she will get the surgery first and then start therapy.  Patient told now that has been a setback.  Patient still have chronic stressors.  Her husband not able to find a job but today he has appointment.  Patient told that they are using now savings and having some financial stress.  She denies any agitation, feeling of hopelessness or worthlessness.  She denies any suicidal thoughts.  Her job sometimes challenging but manageable.  Her appetite is okay.  Her weight is stable.   Past Psychiatric History:  H/O depression since 2005.  Took Lexapro while pregnant, Zoloft until switched to Effexor in November  2018 when diagnosed with breast cancer.  We switched to Cymbalta for better control on her chronic pain.  Tried Xanax and Lunesta (d/c metallic taste). No history of suicidal attempt or inpatient psychiatric treatment.  Psychiatric Specialty Exam: Physical Exam  Review of Systems  Weight 190 lb (86.2 kg), last menstrual period 09/08/2022.There is no height or weight on file to calculate BMI.  General Appearance: Casual  Eye Contact:  Good  Speech:  Clear and Coherent  Volume:  Normal  Mood:  Dysphoric  Affect:  Congruent  Thought Process:  Goal Directed  Orientation:  Full (Time, Place, and Person)  Thought Content:  Logical  Suicidal Thoughts:  No  Homicidal Thoughts:  No  Memory:  Immediate;   Good Recent;   Good Remote;   Good  Judgement:  Good  Insight:  Good  Psychomotor Activity:  Normal  Concentration:  Concentration: Good and Attention Span: Good  Recall:  Good  Fund of Knowledge:  Good  Language:  Good  Akathisia:  No  Handed:  Right  AIMS (if indicated):     Assets:  Communication Skills Desire for Improvement Housing Social Support Talents/Skills Transportation  ADL's:  Intact  Cognition:  WNL  Sleep:   ok      Assessment and Plan: Primary insomnia.  Anxiety.  Major depressive disorder, recurrent.  Discussed setback as not scheduled for hysterectomy but hoping to get clearance soon.  Does not want to change the medication since it is working well.  She will also consider EMDR soon once she had hysterectomy.  Continue Cymbalta 90 mg daily, Ambien 10 mg daily.  We will provide Klonopin 0.5 mg which he takes half to 1 tablet as needed #15.  Recommended to call us back if she has any question or any concern.  Follow-up in 3 months.  Follow Up Instructions:    I discussed the assessment and treatment plan with the patient. The patient was provided an opportunity to ask questions and all were answered. The patient agreed with the plan and demonstrated an  understanding of the instructions.   The patient was advised to call back or seek an in-person evaluation if the symptoms worsen or if the condition fails to improve as anticipated.  Collaboration of Care: Other provider involved in patient's care AEB notes are available in epic to review.  Patient/Guardian was advised Release of Information must be obtained prior to any record release in order to collaborate their care with an outside provider. Patient/Guardian was advised if they have not already done so to contact the registration department to sign all necessary forms in order for [place] to release information regarding their care.   Consent: Patient/Guardian gives verbal consent for treatment and assignment of benefits for services provided during this visit. Patient/Guardian expressed understanding and agreed to proceed.    I provided 20 minutes of non-face-to-face time during this encounter.   Kathlee Nations, MD

## 2022-09-22 ENCOUNTER — Ambulatory Visit (INDEPENDENT_AMBULATORY_CARE_PROVIDER_SITE_OTHER)
Admission: RE | Admit: 2022-09-22 | Discharge: 2022-09-22 | Disposition: A | Discharge status: Home / Self Care | Payer: 59 | Source: Ambulatory Visit | Attending: Family Medicine | Admitting: Family Medicine

## 2022-09-22 ENCOUNTER — Ambulatory Visit: Payer: 59 | Admitting: Family Medicine

## 2022-09-22 ENCOUNTER — Ambulatory Visit (HOSPITAL_COMMUNITY): Admission: RE | Admit: 2022-09-22 | Payer: 59 | Source: Home / Self Care | Admitting: Obstetrics and Gynecology

## 2022-09-22 ENCOUNTER — Encounter: Payer: Self-pay | Admitting: Family Medicine

## 2022-09-22 VITALS — BP 122/70 | HR 86 | Temp 98.1°F | Ht 68.0 in | Wt 191.0 lb

## 2022-09-22 DIAGNOSIS — F17200 Nicotine dependence, unspecified, uncomplicated: Secondary | ICD-10-CM | POA: Diagnosis not present

## 2022-09-22 DIAGNOSIS — Z01810 Encounter for preprocedural cardiovascular examination: Secondary | ICD-10-CM

## 2022-09-22 DIAGNOSIS — Z01818 Encounter for other preprocedural examination: Secondary | ICD-10-CM | POA: Diagnosis not present

## 2022-09-22 SURGERY — HYSTERECTOMY, VAGINAL, LAPAROSCOPY-ASSISTED, WITH SALPINGECTOMY
Anesthesia: General

## 2022-09-22 NOTE — Patient Instructions (Addendum)
Stay well hydrated Don't fast for the post op time   If you develop any dizziness or palpitations let us know   Use pain medicine with caution   Let's get a chest xray today (because of smoking) Think about quitting with this surgery

## 2022-09-22 NOTE — Assessment & Plan Note (Addendum)
Smokes 3-4 cig per day due to stress Plans to quit with upcoming surgery Explained that her surgical risks are lower if she quits now and cessation would also help healing  Disc in detail risks of smoking and possible outcomes including copd, vascular/ heart disease, cancer , respiratory and sinus infections  Pt voices understanding

## 2022-09-22 NOTE — Assessment & Plan Note (Addendum)
Pt is getting read for lab partial hysterectomy   She had visit in ER in July for syncope/pre syncope that is totally resolved (pt thinks it was from pain med with dehydration and fasting) Reviewed hospital records, lab results and studies in detail   Nl rhythm on EKG today (doubt the T wave changes are significant) Nl exam Is light smoker-disc risks of this and cxr done  Nl exam today  No problems with anesthesia in the past  Smoking is her biggest surgical risk factor (she is otherwise cleared)  with reassuring cxr today
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
## 2022-09-23 ENCOUNTER — Telehealth: Payer: Self-pay | Admitting: Family Medicine

## 2022-09-23 NOTE — Telephone Encounter (Signed)
[facility] called stated need Pre- operative clearance to be fax to [phone] attn. Del .

## 2022-09-23 NOTE — Telephone Encounter (Signed)
OV note, EKG, and xray faxed to # provided

## 2022-10-12 ENCOUNTER — Encounter: Payer: Self-pay | Admitting: Hematology and Oncology

## 2022-10-17 ENCOUNTER — Encounter (HOSPITAL_COMMUNITY): Payer: Self-pay | Admitting: Obstetrics and Gynecology

## 2022-10-17 NOTE — Progress Notes (Signed)
PCP - Dr Loura Pardon Cardiologist - n/a Behavioral Health - Dr Wonda [facility]s Oncology - Dr Nicholas Lose  Chest x-ray - 09/22/22 (2V) EKG - 09/22/22 Stress Test - n/a ECHO - n/a Cardiac Cath - n/a  ICD Pacemaker/Loop - n/a  Sleep Study -  Yes, home test on 06/11/18-Normal study CPAP - none  Diabetes - n/a  STOP now taking any Aspirin (unless otherwise instructed by your surgeon), Aleve, Naproxen, Ibuprofen, Motrin, Advil, Goody's, BC's, all herbal medications, fish oil, and all vitamins.   Coronavirus Screening Do you have any of the following symptoms:  Cough yes/no: No Fever (>100.45F)  yes/no: No Runny nose yes/no: No Sore throat yes/no: No Difficulty breathing/shortness of breath  yes/no: No  Have you traveled in the last 14 days and where? yes/no: No  Patient verbalized understanding of instructions that were given via phone.

## 2022-10-18 ENCOUNTER — Other Ambulatory Visit (HOSPITAL_COMMUNITY): Payer: Self-pay | Admitting: Psychiatry

## 2022-10-18 DIAGNOSIS — F5101 Primary insomnia: Secondary | ICD-10-CM

## 2022-10-19 NOTE — Anesthesia Preprocedure Evaluation (Signed)
Anesthesia Evaluation  Patient identified by MRN, date of birth, ID band Patient awake    Reviewed: Allergy & Precautions, NPO status , Patient's Chart, lab work & pertinent test results  Airway Mallampati: II  TM Distance: >3 FB Neck ROM: Full    Dental no notable dental hx. (+) Dental Advisory Given   Pulmonary Current Smoker and Patient abstained from smoking.   Pulmonary exam normal        Cardiovascular negative cardio ROS Normal cardiovascular exam     Neuro/Psych  PSYCHIATRIC DISORDERS Anxiety Depression    negative neurological ROS     GI/Hepatic Neg liver ROS,GERD  ,,  Endo/Other  negative endocrine ROS    Renal/GU negative Renal ROS     Musculoskeletal negative musculoskeletal ROS (+)    Abdominal   Peds  Hematology negative hematology ROS (+)   Anesthesia Other Findings   Reproductive/Obstetrics                             Anesthesia Physical Anesthesia Plan  ASA: 2  Anesthesia Plan: General   Post-op Pain Management: Tylenol PO (pre-op)* and Toradol IV (intra-op)*   Induction: Intravenous  PONV Risk Score and Plan: 4 or greater and Ondansetron, Dexamethasone, Midazolam and Scopolamine patch - Pre-op  Airway Management Planned: Oral ETT  Additional Equipment: None  Intra-op Plan:   Post-operative Plan: Extubation in OR  Informed Consent: I have reviewed the patients History and Physical, chart, labs and discussed the procedure including the risks, benefits and alternatives for the proposed anesthesia with the patient or authorized representative who has indicated his/her understanding and acceptance.     Dental advisory given  Plan Discussed with: Anesthesiologist and CRNA  Anesthesia Plan Comments:         Anesthesia Quick Evaluation

## 2022-10-20 ENCOUNTER — Encounter (HOSPITAL_COMMUNITY): Payer: Self-pay | Admitting: Obstetrics and Gynecology

## 2022-10-20 ENCOUNTER — Other Ambulatory Visit: Payer: Self-pay

## 2022-10-20 ENCOUNTER — Encounter (HOSPITAL_COMMUNITY)
Admission: RE | Disposition: A | Discharge status: Home / Self Care | Payer: Self-pay | Source: Ambulatory Visit | Attending: Obstetrics and Gynecology

## 2022-10-20 ENCOUNTER — Other Ambulatory Visit: Payer: Self-pay | Admitting: Pediatrics

## 2022-10-20 ENCOUNTER — Ambulatory Visit (HOSPITAL_BASED_OUTPATIENT_CLINIC_OR_DEPARTMENT_OTHER): Payer: 59 | Admitting: Anesthesiology

## 2022-10-20 ENCOUNTER — Ambulatory Visit (HOSPITAL_COMMUNITY): Payer: 59 | Admitting: Anesthesiology

## 2022-10-20 ENCOUNTER — Ambulatory Visit (HOSPITAL_COMMUNITY)
Admission: RE | Admit: 2022-10-20 | Discharge: 2022-10-21 | Disposition: A | Discharge status: Home / Self Care | Payer: 59 | Source: Ambulatory Visit | Attending: Obstetrics and Gynecology | Admitting: Obstetrics and Gynecology

## 2022-10-20 DIAGNOSIS — N8502 Endometrial intraepithelial neoplasia [EIN]: Secondary | ICD-10-CM | POA: Diagnosis present

## 2022-10-20 DIAGNOSIS — Z90710 Acquired absence of both cervix and uterus: Secondary | ICD-10-CM | POA: Diagnosis present

## 2022-10-20 DIAGNOSIS — K219 Gastro-esophageal reflux disease without esophagitis: Secondary | ICD-10-CM | POA: Insufficient documentation

## 2022-10-20 DIAGNOSIS — F17210 Nicotine dependence, cigarettes, uncomplicated: Secondary | ICD-10-CM | POA: Insufficient documentation

## 2022-10-20 DIAGNOSIS — F1290 Cannabis use, unspecified, uncomplicated: Secondary | ICD-10-CM | POA: Insufficient documentation

## 2022-10-20 HISTORY — PX: CYSTOSCOPY: SHX5120

## 2022-10-20 HISTORY — PX: LAPAROSCOPIC VAGINAL HYSTERECTOMY WITH SALPINGO OOPHORECTOMY: SHX6681

## 2022-10-20 LAB — BASIC METABOLIC PANEL
Anion gap: 8 (ref 5–15)
BUN: 21 mg/dL — ABNORMAL HIGH (ref 6–20)
CO2: 19 mmol/L — ABNORMAL LOW (ref 22–32)
Calcium: 8.4 mg/dL — ABNORMAL LOW (ref 8.9–10.3)
Chloride: 108 mmol/L (ref 98–111)
Creatinine, Ser: 0.92 mg/dL (ref 0.44–1.00)
GFR, Estimated: >60 mL/min (ref >60)
Glucose, Bld: 87 mg/dL (ref 70–99)
Potassium: 4.2 mmol/L (ref 3.5–5.1)
Sodium: 135 mmol/L (ref 135–145)

## 2022-10-20 LAB — TYPE AND SCREEN
ABO/RH(D): A NEG
Antibody Screen: NEGATIVE

## 2022-10-20 LAB — POCT PREGNANCY, URINE: Preg Test, Ur: NEGATIVE

## 2022-10-20 SURGERY — HYSTERECTOMY, VAGINAL, LAPAROSCOPY-ASSISTED, WITH SALPINGO-OOPHORECTOMY
Anesthesia: General | Site: Bladder | Laterality: Bilateral

## 2022-10-20 MED ORDER — LACTATED RINGERS IV SOLN: INTRAVENOUS | Status: DC

## 2022-10-20 MED ORDER — LIDOCAINE 2% (20 MG/ML) 5 ML SYRINGE
INTRAMUSCULAR | Status: DC | PRN
  Administered 2022-10-20: 60 mg via INTRAVENOUS

## 2022-10-20 MED ORDER — PROPOFOL 10 MG/ML IV BOLUS
INTRAVENOUS | Status: AC
  Filled 2022-10-20: qty 20

## 2022-10-20 MED ORDER — CEFAZOLIN SODIUM-DEXTROSE 2-4 GM/100ML-% IV SOLN
INTRAVENOUS | Status: AC
  Filled 2022-10-20: qty 100

## 2022-10-20 MED ORDER — ONDANSETRON HCL 4 MG/2ML IJ SOLN: 4.0000 mg | Freq: Four times a day (QID) | INTRAMUSCULAR | Status: DC | PRN

## 2022-10-20 MED ORDER — KETOROLAC TROMETHAMINE 30 MG/ML IJ SOLN
30.0000 mg | Freq: Four times a day (QID) | INTRAMUSCULAR | Status: AC
  Administered 2022-10-20 – 2022-10-21 (×4): 30 mg via INTRAVENOUS
  Filled 2022-10-20 (×4): qty 1

## 2022-10-20 MED ORDER — PROMETHAZINE HCL 25 MG/ML IJ SOLN: 6.2500 mg | INTRAMUSCULAR | Status: DC | PRN

## 2022-10-20 MED ORDER — BUPIVACAINE HCL (PF) 0.25 % IJ SOLN
INTRAMUSCULAR | Status: DC | PRN
  Administered 2022-10-20: 12 mL

## 2022-10-20 MED ORDER — PROPOFOL 10 MG/ML IV BOLUS
INTRAVENOUS | Status: DC | PRN
  Administered 2022-10-20: 150 mg via INTRAVENOUS

## 2022-10-20 MED ORDER — NALOXONE HCL 0.4 MG/ML IJ SOLN: 0.4000 mg | INTRAMUSCULAR | Status: DC | PRN

## 2022-10-20 MED ORDER — SUGAMMADEX SODIUM 200 MG/2ML IV SOLN
INTRAVENOUS | Status: DC | PRN
  Administered 2022-10-20: 200 mg via INTRAVENOUS

## 2022-10-20 MED ORDER — FENTANYL CITRATE (PF) 100 MCG/2ML IJ SOLN
INTRAMUSCULAR | Status: AC
  Filled 2022-10-20: qty 2

## 2022-10-20 MED ORDER — VASOPRESSIN 20 UNIT/ML IV SOLN
INTRAVENOUS | Status: AC
  Filled 2022-10-20: qty 1

## 2022-10-20 MED ORDER — CEFAZOLIN SODIUM-DEXTROSE 2-4 GM/100ML-% IV SOLN
2.0000 g | Freq: Once | INTRAVENOUS | Status: AC
  Administered 2022-10-20: 2 g via INTRAVENOUS

## 2022-10-20 MED ORDER — ESTRADIOL 0.1 MG/GM VA CREA
TOPICAL_CREAM | VAGINAL | Status: AC
  Filled 2022-10-20: qty 42.5

## 2022-10-20 MED ORDER — SCOPOLAMINE 1 MG/3DAYS TD PT72: 1.0000 | MEDICATED_PATCH | TRANSDERMAL | Status: DC

## 2022-10-20 MED ORDER — DIPHENHYDRAMINE HCL 12.5 MG/5ML PO ELIX: 12.5000 mg | ORAL_SOLUTION | Freq: Four times a day (QID) | ORAL | Status: DC | PRN

## 2022-10-20 MED ORDER — LACTATED RINGERS IV SOLN: INTRAVENOUS | Status: DC | PRN

## 2022-10-20 MED ORDER — SODIUM CHLORIDE (PF) 0.9 % IJ SOLN
INTRAMUSCULAR | Status: AC
  Filled 2022-10-20: qty 50

## 2022-10-20 MED ORDER — ACETAMINOPHEN 10 MG/ML IV SOLN
INTRAVENOUS | Status: AC
  Filled 2022-10-20: qty 100

## 2022-10-20 MED ORDER — DEXTROSE 5 % IV SOLN: 2000.0000 mg | Freq: Once | INTRAVENOUS | Status: DC

## 2022-10-20 MED ORDER — HYDROMORPHONE HCL 1 MG/ML IJ SOLN
INTRAMUSCULAR | Status: DC | PRN
  Administered 2022-10-20: .5 mg via INTRAVENOUS

## 2022-10-20 MED ORDER — FENTANYL 50 MCG/ML IV PCA SOLN
INTRAVENOUS | Status: AC
  Administered 2022-10-20 – 2022-10-21 (×2): 50 ug via INTRAVENOUS
  Administered 2022-10-21: 90 ug via INTRAVENOUS
  Administered 2022-10-21: 50 ug via INTRAVENOUS
  Filled 2022-10-20: qty 25

## 2022-10-20 MED ORDER — FENTANYL CITRATE (PF) 250 MCG/5ML IJ SOLN
INTRAMUSCULAR | Status: AC
  Filled 2022-10-20: qty 5

## 2022-10-20 MED ORDER — SODIUM CHLORIDE 0.9 % IR SOLN
Status: DC | PRN
  Administered 2022-10-20: 1000 mL via INTRAVESICAL
  Administered 2022-10-20: 1000 mL

## 2022-10-20 MED ORDER — DULOXETINE HCL 30 MG PO CPEP: 30.0000 mg | ORAL_CAPSULE | ORAL | Status: DC

## 2022-10-20 MED ORDER — OXYCODONE HCL 5 MG PO TABS
5.0000 mg | ORAL_TABLET | ORAL | Status: DC | PRN
  Administered 2022-10-21: 5 mg via ORAL
  Filled 2022-10-20: qty 1

## 2022-10-20 MED ORDER — ACETAMINOPHEN 10 MG/ML IV SOLN
INTRAVENOUS | Status: DC | PRN
  Administered 2022-10-20: 1000 mg via INTRAVENOUS

## 2022-10-20 MED ORDER — SUMATRIPTAN SUCCINATE 50 MG PO TABS: 50.0000 mg | ORAL_TABLET | Freq: Once | ORAL | Status: DC | PRN

## 2022-10-20 MED ORDER — ACETAMINOPHEN 500 MG PO TABS
ORAL_TABLET | ORAL | Status: AC
  Administered 2022-10-20: 1000 mg via ORAL
  Filled 2022-10-20: qty 2

## 2022-10-20 MED ORDER — MIDAZOLAM HCL 2 MG/2ML IJ SOLN
INTRAMUSCULAR | Status: AC
  Filled 2022-10-20: qty 2

## 2022-10-20 MED ORDER — HYDROMORPHONE HCL 1 MG/ML IJ SOLN
INTRAMUSCULAR | Status: AC
  Filled 2022-10-20: qty 0.5

## 2022-10-20 MED ORDER — TOPIRAMATE 100 MG PO TABS
100.0000 mg | ORAL_TABLET | Freq: Every day | ORAL | Status: DC
  Administered 2022-10-20: 100 mg via ORAL
  Filled 2022-10-20 (×2): qty 1

## 2022-10-20 MED ORDER — SODIUM CHLORIDE 0.9% FLUSH: 9.0000 mL | INTRAVENOUS | Status: DC | PRN

## 2022-10-20 MED ORDER — DULOXETINE HCL 30 MG PO CPEP
90.0000 mg | ORAL_CAPSULE | Freq: Every day | ORAL | Status: DC
  Administered 2022-10-21: 90 mg via ORAL
  Filled 2022-10-20: qty 3

## 2022-10-20 MED ORDER — HEMOSTATIC AGENTS (NO CHARGE) OPTIME
TOPICAL | Status: DC | PRN
  Administered 2022-10-20: 1 via TOPICAL

## 2022-10-20 MED ORDER — ACETAMINOPHEN 500 MG PO TABS: 1000.0000 mg | ORAL_TABLET | Freq: Once | ORAL | Status: AC

## 2022-10-20 MED ORDER — ORAL CARE MOUTH RINSE: 15.0000 mL | Freq: Once | OROMUCOSAL | Status: AC

## 2022-10-20 MED ORDER — CHLORHEXIDINE GLUCONATE 0.12 % MT SOLN: 15.0000 mL | Freq: Once | OROMUCOSAL | Status: AC

## 2022-10-20 MED ORDER — DEXMEDETOMIDINE HCL IN NACL 80 MCG/20ML IV SOLN
INTRAVENOUS | Status: DC | PRN
  Administered 2022-10-20: 12 ug via BUCCAL

## 2022-10-20 MED ORDER — DEXAMETHASONE SODIUM PHOSPHATE 10 MG/ML IJ SOLN
INTRAMUSCULAR | Status: DC | PRN
  Administered 2022-10-20: 5 mg via INTRAVENOUS

## 2022-10-20 MED ORDER — PROPOFOL 500 MG/50ML IV EMUL
INTRAVENOUS | Status: DC | PRN
  Administered 2022-10-20: 150 ug/kg/min via INTRAVENOUS

## 2022-10-20 MED ORDER — ALBUMIN HUMAN 5 % IV SOLN: INTRAVENOUS | Status: DC | PRN

## 2022-10-20 MED ORDER — BUPIVACAINE HCL (PF) 0.25 % IJ SOLN
INTRAMUSCULAR | Status: AC
  Filled 2022-10-20: qty 30

## 2022-10-20 MED ORDER — MIDAZOLAM HCL 5 MG/5ML IJ SOLN
INTRAMUSCULAR | Status: DC | PRN
  Administered 2022-10-20: 2 mg via INTRAVENOUS

## 2022-10-20 MED ORDER — 0.9 % SODIUM CHLORIDE (POUR BTL) OPTIME
TOPICAL | Status: DC | PRN
  Administered 2022-10-20 (×2): 1000 mL

## 2022-10-20 MED ORDER — AMISULPRIDE (ANTIEMETIC) 5 MG/2ML IV SOLN: 10.0000 mg | Freq: Once | INTRAVENOUS | Status: DC | PRN

## 2022-10-20 MED ORDER — ROCURONIUM BROMIDE 10 MG/ML (PF) SYRINGE
PREFILLED_SYRINGE | INTRAVENOUS | Status: DC | PRN
  Administered 2022-10-20: 20 mg via INTRAVENOUS
  Administered 2022-10-20: 70 mg via INTRAVENOUS
  Administered 2022-10-20 (×3): 20 mg via INTRAVENOUS

## 2022-10-20 MED ORDER — IBUPROFEN 600 MG PO TABS: 600.0000 mg | ORAL_TABLET | Freq: Four times a day (QID) | ORAL | Status: DC

## 2022-10-20 MED ORDER — ACETAMINOPHEN 500 MG PO TABS
1000.0000 mg | ORAL_TABLET | Freq: Four times a day (QID) | ORAL | Status: DC
  Administered 2022-10-20 – 2022-10-21 (×3): 1000 mg via ORAL
  Filled 2022-10-20 (×3): qty 2

## 2022-10-20 MED ORDER — CHLORHEXIDINE GLUCONATE 0.12 % MT SOLN
OROMUCOSAL | Status: AC
  Administered 2022-10-20: 15 mL via OROMUCOSAL
  Filled 2022-10-20: qty 15

## 2022-10-20 MED ORDER — FENTANYL CITRATE (PF) 100 MCG/2ML IJ SOLN
25.0000 ug | INTRAMUSCULAR | Status: DC | PRN
  Administered 2022-10-20 (×2): 50 ug via INTRAVENOUS

## 2022-10-20 MED ORDER — VASOPRESSIN 20 UNIT/ML IV SOLN
INTRAVENOUS | Status: DC | PRN
  Administered 2022-10-20: 5 mL

## 2022-10-20 MED ORDER — ONDANSETRON HCL 4 MG/2ML IJ SOLN
INTRAMUSCULAR | Status: DC | PRN
  Administered 2022-10-20: 4 mg via INTRAVENOUS

## 2022-10-20 MED ORDER — ESMOLOL HCL 100 MG/10ML IV SOLN
INTRAVENOUS | Status: DC | PRN
  Administered 2022-10-20 (×2): 30 mg via INTRAVENOUS

## 2022-10-20 MED ORDER — FENTANYL CITRATE (PF) 250 MCG/5ML IJ SOLN
INTRAMUSCULAR | Status: DC | PRN
  Administered 2022-10-20: 50 ug via INTRAVENOUS
  Administered 2022-10-20: 100 ug via INTRAVENOUS
  Administered 2022-10-20: 50 ug via INTRAVENOUS

## 2022-10-20 MED ORDER — DIPHENHYDRAMINE HCL 50 MG/ML IJ SOLN: 12.5000 mg | Freq: Four times a day (QID) | INTRAMUSCULAR | Status: DC | PRN

## 2022-10-20 MED ORDER — SCOPOLAMINE 1 MG/3DAYS TD PT72
MEDICATED_PATCH | TRANSDERMAL | Status: AC
  Administered 2022-10-20: 1.5 mg via TRANSDERMAL
  Filled 2022-10-20: qty 1

## 2022-10-20 SURGICAL SUPPLY — 80 items
ADH SKN CLS APL DERMABOND .7 (GAUZE/BANDAGES/DRESSINGS) ×4
APL SRG 38 LTWT LNG FL B (MISCELLANEOUS) ×4
APPLICATOR ARISTA FLEXITIP XL (MISCELLANEOUS) ×2 IMPLANT
BARRIER ADHS 3X4 INTERCEED (GAUZE/BANDAGES/DRESSINGS) IMPLANT
BRR ADH 4X3 ABS CNTRL BYND (GAUZE/BANDAGES/DRESSINGS)
CABLE HIGH FREQUENCY MONO STRZ (ELECTRODE) IMPLANT
COVER BACK TABLE 60X90IN (DRAPES) ×5 IMPLANT
COVER MAYO STAND STRL (DRAPES) ×3 IMPLANT
DERMABOND ADVANCED .7 DNX12 (GAUZE/BANDAGES/DRESSINGS) ×5 IMPLANT
DRSG OPSITE POSTOP 3X4 (GAUZE/BANDAGES/DRESSINGS) IMPLANT
DURAPREP 26ML APPLICATOR (WOUND CARE) ×5 IMPLANT
ELECT REM PT RETURN 9FT ADLT (ELECTROSURGICAL) ×4
ELECTRODE REM PT RTRN 9FT ADLT (ELECTROSURGICAL) ×5 IMPLANT
FORCEPS CUTTING 33CM 5MM (CUTTING FORCEPS) ×3 IMPLANT
GAUZE 4X4 16PLY ~~LOC~~+RFID DBL (SPONGE) ×5 IMPLANT
GLOVE BIO SURGEON STRL SZ7.5 (GLOVE) ×5 IMPLANT
GLOVE BIOGEL PI IND STRL 7.0 (GLOVE) ×8 IMPLANT
GLOVE BIOGEL PI IND STRL 7.5 (GLOVE) ×10 IMPLANT
GLOVE SURG ENC MOIS LTX SZ7.5 (GLOVE) ×3 IMPLANT
GLOVE SURG UNDER LTX SZ7.5 (GLOVE) ×6 IMPLANT
GLOVE SURG UNDER POLY LF SZ7 (GLOVE) ×8 IMPLANT
GOWN STRL REUS W/ TWL LRG LVL3 (GOWN DISPOSABLE) ×10 IMPLANT
GOWN STRL REUS W/TWL LRG LVL3 (GOWN DISPOSABLE) ×8
HEMOSTAT ARISTA ABSORB 3G PWDR (HEMOSTASIS) ×2 IMPLANT
HEMOSTAT SURGICEL 4X8 (HEMOSTASIS) IMPLANT
IRRIG SUCT STRYKERFLOW 2 WTIP (MISCELLANEOUS) ×4
IRRIGATION SUCT STRKRFLW 2 WTP (MISCELLANEOUS) ×2 IMPLANT
KIT TURNOVER KIT B (KITS) ×5 IMPLANT
LEGGING LITHOTOMY PAIR STRL (DRAPES) ×3 IMPLANT
LIGASURE VESSEL 5MM BLUNT TIP (ELECTROSURGICAL) ×2 IMPLANT
NDL INSUFFLATION 14GA 120MM (NEEDLE) ×3 IMPLANT
NDL MAYO CATGUT SZ4 TPR NDL (NEEDLE) IMPLANT
NEEDLE INSUFFLATION 14GA 120MM (NEEDLE) ×4 IMPLANT
NEEDLE MAYO CATGUT SZ4 (NEEDLE) IMPLANT
NS IRRIG 1000ML POUR BTL (IV SOLUTION) ×5 IMPLANT
PACK LAPAROSCOPY BASIN (CUSTOM PROCEDURE TRAY) ×5 IMPLANT
PACK LAVH (CUSTOM PROCEDURE TRAY) ×5 IMPLANT
PACK ROBOTIC GOWN (GOWN DISPOSABLE) ×5 IMPLANT
PACK TRENDGUARD 450 HYBRID PRO (MISCELLANEOUS) ×2 IMPLANT
PAD POSITIONING PINK XL (MISCELLANEOUS) ×3 IMPLANT
PROTECTOR NERVE ULNAR (MISCELLANEOUS) ×10 IMPLANT
SCISSORS LAP 5X35 DISP (ENDOMECHANICALS) IMPLANT
SET CYSTO W/LG BORE CLAMP LF (SET/KITS/TRAYS/PACK) IMPLANT
SET IRRIG TUBING LAPAROSCOPIC (IRRIGATION / IRRIGATOR) IMPLANT
SET TUBE SMOKE EVAC HIGH FLOW (TUBING) ×5 IMPLANT
SHEARS HARMONIC ACE PLUS 36CM (ENDOMECHANICALS) IMPLANT
SLEEVE ADV FIXATION 5X100MM (TROCAR) ×6 IMPLANT
SLEEVE Z-THREAD 5X100MM (TROCAR) ×3 IMPLANT
SOL ELECTROSURG ANTI STICK (MISCELLANEOUS)
SOLUTION ELECTROSURG ANTI STCK (MISCELLANEOUS) ×3 IMPLANT
SPIKE FLUID TRANSFER (MISCELLANEOUS) ×2 IMPLANT
SUT CHROMIC 2 0 SH (SUTURE) IMPLANT
SUT MNCRL AB 3-0 PS2 27 (SUTURE) ×10 IMPLANT
SUT MON AB 4-0 PS1 27 (SUTURE) ×5 IMPLANT
SUT VIC AB 0 CT1 18XCR BRD8 (SUTURE) ×12 IMPLANT
SUT VIC AB 0 CT1 27 (SUTURE) ×12
SUT VIC AB 0 CT1 27XBRD ANBCTR (SUTURE) ×9 IMPLANT
SUT VIC AB 0 CT1 36 (SUTURE) ×5 IMPLANT
SUT VIC AB 0 CT1 8-18 (SUTURE) ×12
SUT VIC AB 2-0 SH 27 (SUTURE) ×4
SUT VIC AB 2-0 SH 27X BRD (SUTURE) ×2 IMPLANT
SUT VIC AB 3-0 SH 27 (SUTURE) ×8
SUT VIC AB 3-0 SH 27X BRD (SUTURE) ×2 IMPLANT
SUT VIC AB 3-0 SH 27XBRD (SUTURE) ×2 IMPLANT
SUT VICRYL 0 ENDOLOOP (SUTURE) IMPLANT
SUT VICRYL 0 TIES 12 18 (SUTURE) ×5 IMPLANT
SUT VICRYL 0 UR6 27IN ABS (SUTURE) ×5 IMPLANT
SYR 50ML LL SCALE MARK (SYRINGE) IMPLANT
SYR BULB IRRIG 60ML STRL (SYRINGE) ×2 IMPLANT
SYS BAG RETRIEVAL 10MM (BASKET)
SYSTEM BAG RETRIEVAL 10MM (BASKET) IMPLANT
TOWEL GREEN STERILE FF (TOWEL DISPOSABLE) ×10 IMPLANT
TRAY FOLEY W/BAG SLVR 14FR (SET/KITS/TRAYS/PACK) ×5 IMPLANT
TRENDGUARD 450 HYBRID PRO PACK (MISCELLANEOUS) ×4
TROCAR 11X100 Z THREAD (TROCAR) ×5 IMPLANT
TROCAR BALLN 12MMX100 BLUNT (TROCAR) IMPLANT
TROCAR XCEL NON-BLD 5MMX100MML (ENDOMECHANICALS) ×3 IMPLANT
TROCAR Z-THREAD OPTICAL 5X100M (TROCAR) IMPLANT
UNDERPAD 30X36 HEAVY ABSORB (UNDERPADS AND DIAPERS) ×5 IMPLANT
WARMER LAPAROSCOPE (MISCELLANEOUS) ×5 IMPLANT

## 2022-10-20 NOTE — Transfer of Care (Signed)
Immediate Anesthesia Transfer of Care Note  Patient: Margaret Rojas  Procedure(s) Performed: LAPAROSCOPIC ASSISTED VAGINAL HYSTERECTOMY WITH SALPINGO OOPHORECTOMY (Bilateral: Abdomen) CYSTOSCOPY (Bladder)  Patient Location: PACU  Anesthesia Type:General  Level of Consciousness: awake and drowsy  Airway & Oxygen Therapy: Patient Spontanous Breathing  Post-op Assessment: Report given to RN and Post -op Vital signs reviewed and stable  Post vital signs: Reviewed and stable  Last Vitals:  Vitals Value Taken Time  BP 132/72 10/20/22 1504  Temp    Pulse 83 10/20/22 1507  Resp 10 10/20/22 1507  SpO2 99 % 10/20/22 1507  Vitals shown include unvalidated device data.  Last Pain:  Vitals:   10/20/22 0826  TempSrc:   PainSc: 0-No pain         Complications: No notable events documented.

## 2022-10-20 NOTE — Op Note (Signed)
Preop Diagnosis: EIN   Postop Diagnosis: EIN   Procedure: 1.LAPAROSCOPIC ASSISTED VAGINAL HYSTERECTOMY 2.BILATERAL SALPINGECTOMY 3.RT CYSTECTOMY 4.CYSTOSCOPY   Anesthesia: General    Attending: Dr. Everett Graff    Assistant:  Dr. Bing Matter   Findings: Nl appearing uterus, cervix, bilateral fallopian tubes. No endometriotic lesions noted.   Pathology: 1.uterus and cervix 2.bilateral fallopian tubes and ovaries   Fluids: See flowsheet   UOP: 300 cc   EBL: 989 cc   Complications: None   Procedure:  The patient was taken to the operating room after the risks, benefits, alternatives, complications, treatment options, and expected outcomes were discussed with the patient. The patient verbalized understanding, the patient concurred with the proposed plan and consent signed and witnessed. The patient was taken to the Operating Room and identified as Margaret Rojas and the procedure verified as LAVH/BS/Cystoscopy.  The patient was placed under general anesthesia per anesthesia staff, the patient was placed in modified dorsal lithotomy position and was prepped, draped, and catheterized in the normal, sterile fashion.  A Time Out was held and the above information confirmed.   The cervix was visualized and an intrauterine manipulator was placed.  A 10 mm umbilical incision was then performed. Veress needle was passed and pneumoperitoneum was established. A 10 mm trocar was advanced into the intraabdominal cavity, the laparoscope was introduced and findings as noted above.  Patient was placed in trendelenburg and marcaine injected in the LLQ and a 5 mm incision was made and 5 mm trocar advanced into the intraabdominal cavity.  The same was done in the RLQ.  The ligasure was used to excise the right fallopian tube in a sequential fashion.  The fallopian tube was removed and sent to pathology.  The same was done on the contralateral side.  The ligasure was then used to cauterize  and cut the right infundibulo-pelvic ligament down to the round ligament, tube excised and removed and bladder flap created on that side.  The same was done on the contralateral side.  Attention was then turned to the perineum after covering the abdominal trocars.  The intrauterine manipulator was removed and weighted speculum placed.  The anterior lip of the cervix was grasped with a single tooth tenaculum and the cervix injected with dilute pitressin (20u in 50cc NS).  The cervix was then circumscribed with the bovie and anterior cul-de-sac entered without difficulty.  The posterior cul-de-sac was then entered as well and uterosacral ligament on the right clamped with the heaney clamp, cut and suture ligated.  The same was done on the contralateral side.  Weighted speculum was then removed and long weighted speculum placed in the vagina and in the posterior cul-de-sac.  In a sequential fashion, the paracervical tissue and parametrial tissue was clamped cut and suture ligated on the right and then on the contralateral side until the uterine fundus was able to be flipped using a sweetheart tenaculum.  The remaining pedicle on the right was clamped cut and ligated and then suture ligated.  The same was done on the contralateral side and uterus and cervix removed.  A McCall culdoplasty stitch was placed and the cuff closed with figure of eight stitches of 0 vicryl in a horizontal fashion and then [employer] stitch tied.  Cystoscopy was performed and bilateral ureters were noted to efflux without difficulty and no inadvertent bladder injuries were noted.   Attention was then returned to the abdomen after regowning and regloving.  The laparoscope was introduced at the umbilicus  and inspection performed.  The abdomen was copiously irrigated. Good hemostasis at all pedicles and at the cuff were noted.  The right and left lower quadrant trocars were removed under direct visualization and pneumoperitoneum  relieved.  The 50NX umbilical trocar was then removed under direct visualization and fascia repaired with 0 vicryl.  The 81m incision was reapproximated with 3-0 monocryl and then dermabond applied to the right and left lower quadrant incisions.     Sponge, instrument, lap and needle counts were correct.  The patient tolerated the procedure well and was awaiting extubation and transfer to the recovery room in stable condition.  I was present and scrubbed and the assistant was required due to complexity of anatomy.

## 2022-10-20 NOTE — Anesthesia Postprocedure Evaluation (Signed)
Anesthesia Post Note  Patient: Margaret Rojas  Procedure(s) Performed: LAPAROSCOPIC ASSISTED VAGINAL HYSTERECTOMY WITH SALPINGO OOPHORECTOMY (Bilateral: Abdomen) CYSTOSCOPY (Bladder)     Patient location during evaluation: PACU Anesthesia Type: General Level of consciousness: awake and alert Pain management: pain level controlled Vital Signs Assessment: post-procedure vital signs reviewed and stable Respiratory status: spontaneous breathing, nonlabored ventilation and respiratory function stable Cardiovascular status: blood pressure returned to baseline and stable Postop Assessment: no apparent nausea or vomiting Anesthetic complications: no  No notable events documented.  Last Vitals:  Vitals:   10/20/22 1530 10/20/22 1545  BP: 113/66 106/71  Pulse: 80 76  Resp: 14 12  Temp:  36.5 C  SpO2: 97% 97%    Last Pain:  Vitals:   10/20/22 1530  TempSrc:   PainSc: 6                  Francie Keeling,W. EDMOND
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
## 2022-10-20 NOTE — Anesthesia Procedure Notes (Signed)
Procedure Name: Intubation Date/Time: 10/20/2022 11:30 AM  Performed by: Georgia Duff, CRNAPre-anesthesia Checklist: Patient identified, Emergency Drugs available, Suction available and Patient being monitored Patient Re-evaluated:Patient Re-evaluated prior to induction Oxygen Delivery Method: Circle System Utilized Preoxygenation: Pre-oxygenation with 100% oxygen Induction Type: IV induction Ventilation: Mask ventilation without difficulty Laryngoscope Size: Miller and 3 Grade View: Grade I Tube type: Oral Tube size: 7.0 mm Number of attempts: 1 Airway Equipment and Method: Stylet and Oral airway Placement Confirmation: ETT inserted through vocal cords under direct vision, positive ETCO2 and breath sounds checked- equal and bilateral Secured at: 21 cm Tube secured with: Tape Dental Injury: Teeth and Oropharynx as per pre-operative assessment

## 2022-10-20 NOTE — Progress Notes (Signed)
Notified Dr, Tobias Alexander that pt had bilateral lumpectomies in 2018 with 3 lymph nodes removed on right and 9 removed on left. Left arm currently restricted. Per Dr.Singer ,OK for IV to be on the right arm .

## 2022-10-21 ENCOUNTER — Encounter (HOSPITAL_COMMUNITY): Payer: Self-pay | Admitting: Obstetrics and Gynecology

## 2022-10-21 DIAGNOSIS — N8502 Endometrial intraepithelial neoplasia [EIN]: Secondary | ICD-10-CM | POA: Diagnosis not present

## 2022-10-21 LAB — BASIC METABOLIC PANEL
Anion gap: 8 (ref 5–15)
BUN: 11 mg/dL (ref 6–20)
CO2: 20 mmol/L — ABNORMAL LOW (ref 22–32)
Calcium: 8 mg/dL — ABNORMAL LOW (ref 8.9–10.3)
Chloride: 108 mmol/L (ref 98–111)
Creatinine, Ser: 0.87 mg/dL (ref 0.44–1.00)
GFR, Estimated: >60 mL/min (ref >60)
Glucose, Bld: 117 mg/dL — ABNORMAL HIGH (ref 70–99)
Potassium: 3.8 mmol/L (ref 3.5–5.1)
Sodium: 136 mmol/L (ref 135–145)

## 2022-10-21 LAB — CBC
HCT: 32.3 % — ABNORMAL LOW (ref 36.0–46.0)
Hemoglobin: 11 g/dL — ABNORMAL LOW (ref 12.0–15.0)
MCH: 31.3 pg (ref 26.0–34.0)
MCHC: 34.1 g/dL (ref 30.0–36.0)
MCV: 92 fL (ref 80.0–100.0)
Platelets: 216 10*3/uL (ref 150–400)
RBC: 3.51 MIL/uL — ABNORMAL LOW (ref 3.87–5.11)
RDW: 12.7 % (ref 11.5–15.5)
WBC: 10.8 10*3/uL — ABNORMAL HIGH (ref 4.0–10.5)
nRBC: 0 % (ref 0.0–0.2)

## 2022-10-21 MED ORDER — OXYCODONE HCL 5 MG PO TABS: 5.0000 mg | ORAL_TABLET | Freq: Four times a day (QID) | ORAL | 0 refills | Status: DC | PRN

## 2022-10-21 NOTE — Progress Notes (Signed)
Fentanyl PCA discontinued at 0548. Remaining 18.53m of medication was wasted in [facility] with CMarvetta Gibbons RN.  HTresa Garter RN

## 2022-10-21 NOTE — Discharge Summary (Addendum)
Physician Discharge Summary  Patient ID: Margaret Rojas MRN: [id number] DOB/AGE: Nov 18, 1975 47 y.o.  Admit date: 10/20/2022 Discharge date: 10/21/2022  Admission Diagnoses: EIN Scheduled LAVH/BSO  Discharge Diagnoses:  Principal Problem:   Status post vaginal hysterectomy Active Problems:   S/P laparoscopic assisted vaginal hysterectomy (LAVH)   Discharged Condition: good  Hospital Course: Uncomplicated.  Pt discharged home on POD 1 ambulating without difficulty, passing flatus and tolerating regular diet without N/V.  Consults: None  Significant Diagnostic Studies: appropriate CBC and BMP post op  Treatments: surgery: LAVH/BSO/Cystoscopy  Discharge Exam: Blood pressure 107/67, pulse 69, temperature 98.4 F (36.9 C), temperature source Oral, resp. rate 14, height '5\' 8"'$  (1.727 m), weight 84.8 kg, last menstrual period 10/11/2022, SpO2 99 %. General appearance: alert and no distress Resp: clear to auscultation bilaterally Cardio: regular rate and rhythm GI: soft, appropriately tender, ND, NABS Extremities: no edema, redness or tenderness in the calves or thighs Incision/Wound: Dressed with dermabond c/d/i  Disposition: Discharge disposition: 01-Home or Self Care        Allergies as of 10/21/2022       Reactions   Pneumovax [pneumococcal Polysaccharide Vaccine] Swelling   Local Reaction Injection site reaction-red and swollen    Sulfonamide Derivatives Hives   Epinephrine Other (See Comments)   Tremors, shakiness        Medication List     TAKE these medications    clonazePAM 0.5 MG tablet Commonly known as: KLONOPIN Take 0.5 tablets (0.25 mg total) by mouth daily as needed for up to 18 days for anxiety. Take half to 1 tablet as needed for severe anxiety.   cyclobenzaprine 10 MG tablet Commonly known as: FLEXERIL Take 10 mg by mouth 2 (two) times daily as needed (migraines).   DULoxetine 60 MG capsule Commonly known as: Cymbalta Take 1 capsule  (60 mg total) by mouth daily.   DULoxetine 30 MG capsule Commonly known as: CYMBALTA Take 1 capsule (30 mg total) by mouth See admin instructions. Take with 60 mg for a total of 90 mg in the morning   ELDERBERRY PO Take 3 capsules by mouth at bedtime.   gabapentin 300 MG capsule Commonly known as: NEURONTIN Take 1 capsule (300 mg total) by mouth 2 (two) times daily.   ibuprofen 200 MG tablet Commonly known as: ADVIL Take 600 mg by mouth every 6 (six) hours as needed for headache.   oxyCODONE 5 MG immediate release tablet Commonly known as: Oxy IR/ROXICODONE Take 1-2 tablets (5-10 mg total) by mouth every 6 (six) hours as needed for moderate pain, severe pain or breakthrough pain.   topiramate 100 MG tablet Commonly known as: TOPAMAX Take 100 mg by mouth at bedtime.   traMADol 50 MG tablet Commonly known as: ULTRAM Take 50 mg by mouth every 6 (six) hours as needed for moderate pain or severe pain.   zolmitriptan 5 MG tablet Commonly known as: ZOMIG Take 5 mg by mouth as needed for migraine.   zolpidem 10 MG tablet Commonly known as: AMBIEN Take 0.5-1 tablets (5-10 mg total) by mouth at bedtime as needed for sleep.         Signed: Delice Lesch 10/21/2022, 12:29 PM

## 2022-10-23 ENCOUNTER — Telehealth: Payer: Self-pay | Admitting: Hematology and Oncology

## 2022-10-23 LAB — SURGICAL PATHOLOGY

## 2022-10-23 NOTE — Telephone Encounter (Signed)
Scheduled appointment per staff message. Patient is aware of the made appointment. 

## 2022-11-06 NOTE — Progress Notes (Signed)
Patient Care Team: Tower, Wynelle Fanny, MD as PCP - General (Family Medicine) Rolm Bookbinder, MD as Consulting Physician (General Surgery) Nicholas Lose, MD as Consulting Physician (Hematology and Oncology) Kyung Rudd, MD as Consulting Physician (Radiation Oncology) Delice Bison Charlestine Massed, NP as Nurse Practitioner (Hematology and Oncology)  DIAGNOSIS:  Encounter Diagnoses  Name Primary?   Postmenopausal Yes   Malignant neoplasm of upper-outer quadrant of left breast in female, estrogen receptor positive ([place])     SUMMARY OF ONCOLOGIC HISTORY: Oncology History  Malignant neoplasm of upper-outer quadrant of left breast in female, estrogen receptor positive ([place])  05/13/2017 Initial Diagnosis   Palpable left breast mass with distortion at 1:30 position: 1.1 cm with left axillary lymph node, 3.5 mm cortex, biopsy tubular 05/21/2017. Biopsy of the breast mass revealed grade 1-2 IDC with DCIS ER 95%, PR 95%, Ki-67 20%, HER-2 negative ratio 1.32 T1c N0 stage IA   05/26/2017 Genetic Testing   Negative genetic testing on the 9-gene STAT panel.The STAT Breast cancer panel offered by [facility] includes sequencing and rearrangement analysis for the following 9 genes:  ATM, BRCA1, BRCA2, CDH1, CHEK2, PALB2, PTEN, STK11 and TP53.   The report date is 05/26/2017.  Negative genetic testing on the reflexed common hereditary cancer panel.  The Hereditary Gene Panel offered by [facility] includes sequencing and/or deletion duplication testing of the following 46 genes: APC, ATM, AXIN2, BARD1, BMPR1A, BRCA1, BRCA2, BRIP1, CDH1, CDKN2A (p14ARF), CDKN2A (p16INK4a), CHEK2, CTNNA1, DICER1, EPCAM (Deletion/duplication testing only), GREM1 (promoter region deletion/duplication testing only), KIT, MEN1, MLH1, MSH2, MSH3, MSH6, MUTYH, NBN, NF1, NHTL1, PALB2, PDGFRA, PMS2, POLD1, POLE, PTEN, RAD50, RAD51C, RAD51D, SDHB, SDHC, SDHD, SMAD4, SMARCA4. STK11, TP53, TSC1, TSC2, and VHL.  The following genes were evaluated for  sequence changes only: SDHA and HOXB13 c.251G>A variant only.  The report date is May 26, 2017.   06/19/2017 Surgery   Left lumpectomy: IDC with DCIS, 2.1 cm, 0/9 lymph nodes negative margins negative; ER 95%, PR 95%, HER-2 negative ratio 1.32, Ki-67 20%, T2N0 stage Ib; Right lumpectomy: IDC with DCIS 1.8 cm, focally involving lateral and anterior margins, 0/3 lymph nodes negative, ER 100%, PR 20%, HER-2 negative ratio 1.23, Ki-67 3%, T1CN0 stage I a   06/19/2017 Oncotype testing   Oncotype DX score 17 and 14: Risk of recurrence 11%/9% with tamoxifen alone   07/07/2017 Surgery   Reexcision lateral margin: Residual DCIS intermediate grade, reexcision anterior margin: [facility]   08/13/2017 - 09/29/2017 Radiation Therapy   Adjuvant radiation therapy   10/2017 -  Anti-estrogen oral therapy   Tamoxifen daily   01/20/2018 Cancer Staging   Staging form: Breast, AJCC 8th Edition - Pathologic: Stage IA (pT2, pN0, cM0, G1, ER+, PR+, HER2-) - Signed by Gardenia Phlegm, NP on 01/20/2018   Malignant neoplasm of lower-outer quadrant of right breast of female, estrogen receptor positive ([place]) (Resolved)  07/24/2017 Initial Diagnosis   Malignant neoplasm of lower-outer quadrant of right breast of female, estrogen receptor positive ([place])   01/20/2018 Cancer Staging   Staging form: Breast, AJCC 8th Edition - Pathologic: Stage IA (pT1c, pN0, cM0, G1, ER+, PR+, HER2-) - Signed by Gardenia Phlegm, NP on 01/20/2018     CHIEF COMPLIANT: Follow-up of bilateral breast cancer    INTERVAL HISTORY: SOLARIS KOZLOFF is a 47 y.o. with above-mentioned history of bilateral breast cancers treated with lumpectomies and re-excision, adjuvant radiation, and who is currently on tamoxifen therapy '10mg'$  daily. She presents to the clinic for a annual follow-up. She states  that she is sore and uncomfortable. She does have some hot flashes and says she is not sleeping well. She reports that she lost at least 15-20  pounds in over a year. She does high protein and  intermittent fasting. She does feel better as far as energy wise.     ALLERGIES:  is allergic to pneumovax [pneumococcal polysaccharide vaccine], sulfonamide derivatives, and epinephrine.  MEDICATIONS:  Current Outpatient Medications  Medication Sig Dispense Refill   anastrozole (ARIMIDEX) 1 MG tablet Take 1 tablet (1 mg total) by mouth daily. 90 tablet 3   cyclobenzaprine (FLEXERIL) 10 MG tablet Take 10 mg by mouth 2 (two) times daily as needed (migraines).     DULoxetine (CYMBALTA) 30 MG capsule Take 1 capsule (30 mg total) by mouth See admin instructions. Take with 60 mg for a total of 90 mg in the morning 90 capsule 0   DULoxetine (CYMBALTA) 60 MG capsule Take 1 capsule (60 mg total) by mouth daily. 90 capsule 0   ELDERBERRY PO Take 3 capsules by mouth at bedtime.     gabapentin (NEURONTIN) 300 MG capsule Take 1 capsule (300 mg total) by mouth 2 (two) times daily.     ibuprofen (ADVIL) 200 MG tablet Take 600 mg by mouth every 6 (six) hours as needed for headache.     oxyCODONE (OXY IR/ROXICODONE) 5 MG immediate release tablet Take 1-2 tablets (5-10 mg total) by mouth every 6 (six) hours as needed for moderate pain, severe pain or breakthrough pain. 30 tablet 0   topiramate (TOPAMAX) 100 MG tablet Take 100 mg by mouth at bedtime.     traMADol (ULTRAM) 50 MG tablet Take 50 mg by mouth every 6 (six) hours as needed for moderate pain or severe pain.     zolmitriptan (ZOMIG) 5 MG tablet Take 5 mg by mouth as needed for migraine.     zolpidem (AMBIEN) 10 MG tablet Take 0.5-1 tablets (5-10 mg total) by mouth at bedtime as needed for sleep. 30 tablet 2   clonazePAM (KLONOPIN) 0.5 MG tablet Take 0.5 tablets (0.25 mg total) by mouth daily as needed for up to 18 days for anxiety. Take half to 1 tablet as needed for severe anxiety. 9 tablet 0   No current facility-administered medications for this visit.    PHYSICAL EXAMINATION: ECOG PERFORMANCE  STATUS: 1 - Symptomatic but completely ambulatory  Vitals:   11/10/22 1538  BP: 108/72  Pulse: 99  Resp: 16  Temp: 97.9 F (36.6 C)  SpO2: 100%   Filed Weights   11/10/22 1538  Weight: 189 lb 3.2 oz (85.8 kg)     LABORATORY DATA:  I have reviewed the data as listed    Latest Ref Rng & Units 10/21/2022    4:22 AM 10/20/2022    9:12 AM 09/17/2022    8:56 AM  CMP  Glucose 70 - 99 mg/dL 117  87  92   BUN 6 - 20 mg/dL '11  21  14   '$ Creatinine 0.44 - 1.00 mg/dL 0.87  0.92  0.88   Sodium 135 - 145 mmol/L 136  135  136   Potassium 3.5 - 5.1 mmol/L 3.8  4.2  3.4   Chloride 98 - 111 mmol/L 108  108  105   CO2 22 - 32 mmol/L '20  19  23   '$ Calcium 8.9 - 10.3 mg/dL 8.0  8.4  8.5     Lab Results  Component Value Date   WBC 10.8 (H)  10/21/2022   HGB 11.0 (L) 10/21/2022   HCT 32.3 (L) 10/21/2022   MCV 92.0 10/21/2022   PLT 216 10/21/2022   NEUTROABS 2.4 11/19/2020    ASSESSMENT & PLAN:  Malignant neoplasm of upper-outer quadrant of left breast in female, estrogen receptor positive ([place]) 06/19/2017: Left lumpectomy: IDC with DCIS, 2.1 cm, 0/9 lymph nodes negative margins negative; ER 95%, PR 95%, HER-2 negative ratio 1.32, Ki-67 20%, T2N0 stage Ib;  Right lumpectomy: IDC with DCIS 1.8 cm, focally involving lateral and anterior margins, 0/3 lymph nodes negative, reexcision anterior and lateral margins DCIS, ER 100%, PR 20%, HER-2 negative ratio 1.23, Ki-67 3%, T1CN0 stage I a   Oncotype DX recurrence score 17: Risk of recurrence 11% with tamoxifen alone There was not enough material to send Oncotype on the right breast. Adjuvant radiation therapy 08/13/2017-09/29/2017   Current treatment: Tamoxifen 20 mg daily originally started February 2019 off for 6 weeks restarted April 2020 at 10 mg, hysterectomy with BSO February 2024 (for heavy bleeding), anastrozole to start 12/15/2018   Anastrozole counseling: We discussed the risks and benefits of anti-estrogen therapy with aromatase inhibitors.  These include but not limited to insomnia, hot flashes, mood changes, vaginal dryness, bone density loss, and weight gain. We strongly believe that the benefits far outweigh the risks. Patient understands these risks and consented to starting treatment. Planned treatment duration is 5 years.   Breast cancer surveillance: Left breast mammogram 10/14/2021: Benign breast density category C Breast exam 11/21/2021: Benign   Patient is on a weight loss program through [facility] health and had lost 25 pounds.   Return to clinic in 1 year for follow-up    Orders Placed This Encounter  Procedures   DG Bone Density    Standing Status:   Future    Standing Expiration Date:   11/10/2023    Order Specific Question:   Reason for Exam (SYMPTOM  OR DIAGNOSIS REQUIRED)    Answer:   postmenopausal    Order Specific Question:   Is patient pregnant?    Answer:   No    Order Specific Question:   Preferred imaging location?    Answer:   [facility]   MM Digital Screening    Standing Status:   Future    Standing Expiration Date:   11/10/2023    Order Specific Question:   Reason for Exam (SYMPTOM  OR DIAGNOSIS REQUIRED)    Answer:   Annual mammograms    Order Specific Question:   Is the patient pregnant?    Answer:   No    Order Specific Question:   Preferred imaging location?    Answer:   [facility]    Order Specific Question:   Release to patient    Answer:   Immediate   MR BREAST BILATERAL W WO CONTRAST INC CAD    Standing Status:   Future    Standing Expiration Date:   11/11/2023    Order Specific Question:   If indicated for the ordered procedure, I authorize the administration of contrast media per Radiology protocol    Answer:   Yes    Order Specific Question:   What is the patient's sedation requirement?    Answer:   No Sedation    Order Specific Question:   Does the patient have a pacemaker or implanted devices?    Answer:   No    Order Specific Question:   Preferred imaging location?     Answer:   [id number]  [facility] Osmond (table limit-550lbs)    Order Specific Question:   Release to patient    Answer:   Immediate   The patient has a good understanding of the overall plan. she agrees with it. she will call with any problems that may develop before the next visit here. Total time spent: 30 mins including face to face time and time spent for planning, charting and co-ordination of care   Harriette Ohara, MD 11/10/22    I Gardiner Coins am acting as a Education administrator for [employer]  I have reviewed the above documentation for accuracy and completeness, and I agree with the above.

## 2022-11-10 ENCOUNTER — Inpatient Hospital Stay: Payer: 59 | Attending: Hematology and Oncology | Admitting: Hematology and Oncology

## 2022-11-10 ENCOUNTER — Other Ambulatory Visit: Payer: Self-pay

## 2022-11-10 VITALS — BP 108/72 | HR 99 | Temp 97.9°F | Resp 16 | Wt 189.2 lb

## 2022-11-10 DIAGNOSIS — N951 Menopausal and female climacteric states: Secondary | ICD-10-CM | POA: Insufficient documentation

## 2022-11-10 DIAGNOSIS — R232 Flushing: Secondary | ICD-10-CM | POA: Insufficient documentation

## 2022-11-10 DIAGNOSIS — Z79810 Long term (current) use of selective estrogen receptor modulators (SERMs): Secondary | ICD-10-CM | POA: Diagnosis not present

## 2022-11-10 DIAGNOSIS — Z79899 Other long term (current) drug therapy: Secondary | ICD-10-CM | POA: Insufficient documentation

## 2022-11-10 DIAGNOSIS — Z170 Estrogen receptor positive status [ER+]: Secondary | ICD-10-CM | POA: Diagnosis not present

## 2022-11-10 DIAGNOSIS — Z780 Asymptomatic menopausal state: Secondary | ICD-10-CM | POA: Diagnosis not present

## 2022-11-10 DIAGNOSIS — C50412 Malignant neoplasm of upper-outer quadrant of left female breast: Secondary | ICD-10-CM | POA: Diagnosis not present

## 2022-11-10 MED ORDER — ANASTROZOLE 1 MG PO TABS: 1.0000 mg | ORAL_TABLET | Freq: Every day | ORAL | 3 refills | Status: DC

## 2022-11-10 NOTE — Assessment & Plan Note (Signed)
06/19/2017: Left lumpectomy: IDC with DCIS, 2.1 cm, 0/9 lymph nodes negative margins negative; ER 95%, PR 95%, HER-2 negative ratio 1.32, Ki-67 20%, T2N0 stage Ib;  Right lumpectomy: IDC with DCIS 1.8 cm, focally involving lateral and anterior margins, 0/3 lymph nodes negative, reexcision anterior and lateral margins DCIS, ER 100%, PR 20%, HER-2 negative ratio 1.23, Ki-67 3%, T1CN0 stage I a   Oncotype DX recurrence score 17: Risk of recurrence 11% with tamoxifen alone There was not enough material to send Oncotype on the right breast. Adjuvant radiation therapy 08/13/2017-09/29/2017   Current treatment: Tamoxifen 20 mg daily originally started February 2019 off for 6 weeks restarted April 2020 at 10 mg, hysterectomy with BSO February 2024 (for heavy bleeding), anastrozole to start 12/15/2018   Anastrozole counseling: We discussed the risks and benefits of anti-estrogen therapy with aromatase inhibitors. These include but not limited to insomnia, hot flashes, mood changes, vaginal dryness, bone density loss, and weight gain. We strongly believe that the benefits far outweigh the risks. Patient understands these risks and consented to starting treatment. Planned treatment duration is 5 years.   Breast cancer surveillance: Left breast mammogram 10/14/2021: Benign breast density category C Breast exam 11/21/2021: Benign   Patient is on a weight loss program through [facility] health and had lost 25 pounds.   Return to clinic in 1 year for follow-up

## 2022-11-28 ENCOUNTER — Encounter: Payer: Self-pay | Admitting: Hematology and Oncology

## 2022-12-12 ENCOUNTER — Other Ambulatory Visit (HOSPITAL_COMMUNITY): Payer: Self-pay | Admitting: Psychiatry

## 2022-12-12 DIAGNOSIS — F419 Anxiety disorder, unspecified: Secondary | ICD-10-CM

## 2022-12-12 DIAGNOSIS — F330 Major depressive disorder, recurrent, mild: Secondary | ICD-10-CM

## 2022-12-18 ENCOUNTER — Telehealth (HOSPITAL_COMMUNITY): Payer: 59 | Admitting: Psychiatry

## 2022-12-23 ENCOUNTER — Telehealth (HOSPITAL_COMMUNITY): Payer: 59 | Admitting: Psychiatry

## 2022-12-23 ENCOUNTER — Encounter (HOSPITAL_COMMUNITY): Payer: Self-pay | Admitting: Psychiatry

## 2022-12-23 ENCOUNTER — Telehealth (HOSPITAL_BASED_OUTPATIENT_CLINIC_OR_DEPARTMENT_OTHER): Payer: 59 | Admitting: Psychiatry

## 2022-12-23 VITALS — Wt 189.0 lb

## 2022-12-23 DIAGNOSIS — F330 Major depressive disorder, recurrent, mild: Secondary | ICD-10-CM | POA: Diagnosis not present

## 2022-12-23 DIAGNOSIS — F419 Anxiety disorder, unspecified: Secondary | ICD-10-CM | POA: Diagnosis not present

## 2022-12-23 DIAGNOSIS — F5101 Primary insomnia: Secondary | ICD-10-CM

## 2022-12-23 MED ORDER — CLONAZEPAM 0.5 MG PO TABS: 0.2500 mg | ORAL_TABLET | Freq: Every day | ORAL | 0 refills | Status: DC | PRN

## 2022-12-23 MED ORDER — DULOXETINE HCL 30 MG PO CPEP: 30.0000 mg | ORAL_CAPSULE | ORAL | 0 refills | Status: DC

## 2022-12-23 MED ORDER — DULOXETINE HCL 60 MG PO CPEP: 60.0000 mg | ORAL_CAPSULE | Freq: Every day | ORAL | 0 refills | Status: DC

## 2022-12-23 MED ORDER — ZOLPIDEM TARTRATE 10 MG PO TABS: 5.0000 mg | ORAL_TABLET | Freq: Every evening | ORAL | 2 refills | Status: DC | PRN

## 2022-12-23 NOTE — Progress Notes (Signed)
[facility] MD Virtual Progress Note   Patient Location: In Car Provider Location: Home Office  I connect with patient by video and verified that I am speaking with correct person by using two identifiers. I discussed the limitations of evaluation and management by telemedicine and the availability of in person appointments. I also discussed with the patient that there may be a patient responsible charge related to this service. The patient expressed understanding and agreed to proceed.  Margaret MayhewMichelle D Rojas [id number] 47 y.o.  12/23/2022 11:02 AM  History of Present Illness:  Patient is evaluated by video session.  She is in the car.  She had hysterectomy but now she had hot flashes and going through menopause.  Her OB/GYN started a new medication but so far she has not seen any improvement.  She admitted that she has not tried and off.  Patient reported chronic anxiety and depression.  Her husband working at [employer] 12-hour shift but not happy with the job.  Patient told when he is not happy then everyone felt that he is not happy.  Patient told husband had a job interview and possible offer in [place] and next week he is going [place] to explore further.  Patient has mixed emotions because she wanted her husband to be happy but also it is long distance.  She reported husband working currently 12-hour shift and he is barely home.  She is helping the 47 year old and 47 year old kids.  Her daughter now had a learning permit so she is more independent who goes to [employer].  Her 47 year old requires more help and involvement in sports.  Patient reported job is busy but challenging.  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  Now patient has hysterectomy, she is considering EMDR for trauma and exploring further options.  She admitted not sleeping very well despite taking Ambien.  She is hoping this is just situational and once things get  better she sleeps well.  She denies any feeling of hopelessness or worthlessness.  She admitted some financial challenges but hoping it will get better.  Her appetite is okay.  She has no tremors or shakes or any EPS.  She denies any suicidal thoughts.  She is taking Cymbalta 60 mg and 30 mg.  She also takes muscle relaxant and pain medication as needed.  She recently started anastrozole by oncologist and she is not happy but she has to take for next 5 years.  Patient admitted sometimes life itself is challenging but does not want to give up.  She tried to keep herself busy and she has a good social network.  She does walk 3 to 4 miles a day.  She also enjoys the company with her friends on the weekends.  Past Psychiatric History: H/O depression since 2005.  Took Lexapro while pregnant, Zoloft until switched to Effexor in November 2018 when diagnosed with breast cancer.  We switched to Cymbalta for better control on her chronic pain.  Tried Xanax and Lunesta (d/c metallic taste). No history of suicidal attempt or inpatient psychiatric treatment.    Outpatient Encounter Medications as of 12/23/2022  Medication Sig   anastrozole (ARIMIDEX) 1 MG tablet Take 1 tablet (1 mg total) by mouth daily.   clonazePAM (KLONOPIN) 0.5 MG tablet Take 0.5 tablets (0.25 mg total) by mouth daily as needed for up to 18 days for anxiety. Take half to 1 tablet as needed for severe anxiety.  cyclobenzaprine (FLEXERIL) 10 MG tablet Take 10 mg by mouth 2 (two) times daily as needed (migraines).   DULoxetine (CYMBALTA) 30 MG capsule Take 1 capsule (30 mg total) by mouth See admin instructions. Take with 60 mg for a total of 90 mg in the morning   DULoxetine (CYMBALTA) 60 MG capsule Take 1 capsule (60 mg total) by mouth daily.   ELDERBERRY PO Take 3 capsules by mouth at bedtime.   gabapentin (NEURONTIN) 300 MG capsule Take 1 capsule (300 mg total) by mouth 2 (two) times daily.   ibuprofen (ADVIL) 200 MG tablet Take 600 mg by  mouth every 6 (six) hours as needed for headache.   oxyCODONE (OXY IR/ROXICODONE) 5 MG immediate release tablet Take 1-2 tablets (5-10 mg total) by mouth every 6 (six) hours as needed for moderate pain, severe pain or breakthrough pain.   topiramate (TOPAMAX) 100 MG tablet Take 100 mg by mouth at bedtime.   traMADol (ULTRAM) 50 MG tablet Take 50 mg by mouth every 6 (six) hours as needed for moderate pain or severe pain.   zolmitriptan (ZOMIG) 5 MG tablet Take 5 mg by mouth as needed for migraine.   zolpidem (AMBIEN) 10 MG tablet Take 0.5-1 tablets (5-10 mg total) by mouth at bedtime as needed for sleep.   No facility-administered encounter medications on file as of 12/23/2022.    Recent Results (from the past 2160 hour(s))  Type and screen  [facility]     Status: None   Collection Time: 10/20/22  8:15 AM  Result Value Ref Range   ABO/RH(D) A NEG    Antibody Screen NEG    Sample Expiration      10/23/2022,2359 Performed at [facility], [address] [place], [place] [address]   Pregnancy, urine POC     Status: None   Collection Time: 10/20/22  9:06 AM  Result Value Ref Range   Preg Test, Ur NEGATIVE NEGATIVE    Comment:        THE SENSITIVITY OF THIS METHODOLOGY IS >24 mIU/mL   Basic metabolic panel     Status: Abnormal   Collection Time: 10/20/22  9:12 AM  Result Value Ref Range   Sodium 135 135 - 145 mmol/L   Potassium 4.2 3.5 - 5.1 mmol/L   Chloride 108 98 - 111 mmol/L   CO2 19 (L) 22 - 32 mmol/L   Glucose, Bld 87 70 - 99 mg/dL    Comment: Glucose reference range applies only to samples taken after fasting for at least 8 hours.   BUN 21 (H) 6 - 20 mg/dL   Creatinine, Ser 6.04 0.44 - 1.00 mg/dL   Calcium 8.4 (L) 8.9 - 10.3 mg/dL   GFR, Estimated >54 >09 mL/min    Comment: (NOTE) Calculated using the CKD-EPI Creatinine Equation (2021)    Anion gap 8 5 - 15    Comment: Performed at [facility], [address], [place], [place] [address]   Surgical pathology     Status: None   Collection Time: 10/20/22 12:23 PM  Result Value Ref Range   SURGICAL PATHOLOGY      SURGICAL PATHOLOGY CASE: [id number] PATIENT: Margaret Rojas Surgical Pathology Report     Clinical History: Endometrial intraepithelial neoplasia (nt)     FINAL MICROSCOPIC DIAGNOSIS:  A. 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 Luisa Hart reviewed the case and concurs with the diagnosis.      GROSS DESCRIPTION:  Specimen: Received fresh labeled uterus with cervix, bilateral fallopian tubes, bilateral ovaries Specimen integrity: An intact uterus and cervix with attached ovaries and detached, unoriented fallopian tubes Size and shape: A symmetrical uterus measuring 10.8 cm fundus to cervix by 6.0 cm cornu to cornu by 5.0 cm anterior to posterior Weight: 166 g Serosa:  Tan-pink and hyperemic Cervix: Ectocervix is tan-gray, smooth, glistening, and measures 4.5 x 2.8 cm with a 2.0 cm cervical os.  Endocervical canal measures 2.5 cm in length, and is tan-pink and corrugated.  Cervical stroma is tan and fibrous with multiple nabothian cysts. Endometrium: The triangular endometrial cavity measures 6.2 x 4.0 cm. Endometrium is tan-pink to red, hyperemic and lush.  The endometrium measures up to 0.7 cm in thickness. Myometrium: Tan-pink and trabeculated, and a single tan-white, whorled intramural nodules identified measuring 0.6 cm in greatest dimension. Right adnexa: The attached right ovary measures 3.5 x 3.1 x 2.5 cm. External surface is tan-gray, cerebriform, with a 3.4 cm cystic structure identified.  The cyst displays a tan-yellow to red-brown and hemorrhagic  cut surface.  The remaining ovarian parenchyma is tan-gray and fibrous with multiple tan-yellow corpus luteum. Left adnexa: The attached left ovary measures 2.6 x 1.9 x 1.3 cm. E xternal surface is tan-gray and cerebriform.  Sectioning the ovary reveals a tan-gray fibrous cut surface, with multiple serous and red-brown hemorrhagic cystic structures. Bilateral fallopian tubes: The shorter detached fallopian tube measures 4.2 cm in length by 0.5 cm in diameter.  The serosal surface is tan-gray with mild adhesions.  Sectioning the fallopian tube reveals a tan cut surface with a pinpoint lumen.  The longer fimbriated fallopian tube measures 5.1 cm in length and up to 0.7 cm in diameter.  The serosal surface is tan-gray, dusky, with mild adhesions and a 0.5 cm paratubal cyst.  Sectioning the fallopian tube reveals a tan cut surface with a pinpoint lumen. Block Summary: 1 = anterior cervix 2 = posterior cervix 3-11 = anterior endomyometrium, entirely submitted 12-21 = posterior endomyometrium, entirely submitted 22-25 = right ovary 26 and 27 = left ovary 28-30 = shorter fallopian tube, entirely submitted 31-33 = longer fallopian tube, entirely subm itted  Lovey Newcomer 10/21/2022)    Final Diagnosis performed by Holley Bouche, MD.   Electronically signed 10/23/2022 Technical component performed at [employer], [address] [place], [place] [address].  Professional component performed at [facility], [address] [place], [place] [address].  Immunohistochemistry Technical component (if applicable) was performed at [facility]. [address], [place], [place] [address].   IMMUNOHISTOCHEMISTRY DISCLAIMER (if applicable): Some of these immunohistochemical stains may have been developed and the performance characteristics determine by [facility]. Some may not have been cleared or approved by the U.S. Food and  Drug Administration. The FDA has determined that such clearance or approval is not necessary. This test is used for clinical purposes. It should not be regarded as investigational or for research. This laboratory is certified under the  Clinical Laboratory Improvement Amendments of 1988 (CLIA-88) as qualified to perform high complexity clinical laboratory testing.  The controls stained appropriately.   CBC     Status: Abnormal   Collection Time: 10/21/22  4:22 AM  Result Value Ref Range  WBC 10.8 (H) 4.0 - 10.5 K/uL   RBC 3.51 (L) 3.87 - 5.11 MIL/uL   Hemoglobin 11.0 (L) 12.0 - 15.0 g/dL   HCT 16.1 (L) 09.6 - 04.5 %   MCV 92.0 80.0 - 100.0 fL   MCH 31.3 26.0 - 34.0 pg   MCHC 34.1 30.0 - 36.0 g/dL   RDW 40.9 81.1 - 91.4 %   Platelets 216 150 - 400 K/uL   nRBC 0.0 0.0 - 0.2 %    Comment: Performed at [facility], [address], [place], [place] [address]  Basic metabolic panel     Status: Abnormal   Collection Time: 10/21/22  4:22 AM  Result Value Ref Range   Sodium 136 135 - 145 mmol/L   Potassium 3.8 3.5 - 5.1 mmol/L   Chloride 108 98 - 111 mmol/L   CO2 20 (L) 22 - 32 mmol/L   Glucose, Bld 117 (H) 70 - 99 mg/dL    Comment: Glucose reference range applies only to samples taken after fasting for at least 8 hours.   BUN 11 6 - 20 mg/dL   Creatinine, Ser 6.21 0.44 - 1.00 mg/dL   Calcium 8.0 (L) 8.9 - 10.3 mg/dL   GFR, Estimated >30 >86 mL/min    Comment: (NOTE) Calculated using the CKD-EPI Creatinine Equation (2021)    Anion gap 8 5 - 15    Comment: Performed at [facility], [address] [place], [place] [address]     Psychiatric Specialty Exam: Physical Exam  Review of Systems  Weight 189 lb (85.7 kg).There is no height or weight on file to calculate BMI.  General Appearance: Casual  Eye Contact:  Good  Speech:  Clear and Coherent and Normal Rate  Volume:  Normal  Mood:  Anxious and Dysphoric  Affect:  Appropriate  Thought Process:  Goal Directed   Orientation:  Full (Time, Place, and Person)  Thought Content:  Rumination  Suicidal Thoughts:  No  Homicidal Thoughts:  No  Memory:  Immediate;   Good Recent;   Good Remote;   Good  Judgement:  Good  Insight:  Good  Psychomotor Activity:  Normal  Concentration:  Concentration: Good and Attention Span: Good  Recall:  Good  Fund of Knowledge:  Good  Language:  Good  Akathisia:  No  Handed:  Right  AIMS (if indicated):     Assets:  Communication Skills Desire for Improvement Housing Social Support Transportation  ADL's:  Intact  Cognition:  WNL  Sleep:  fair     Assessment/Plan: MDD (major depressive disorder), recurrent episode, mild - Plan: DULoxetine (CYMBALTA) 30 MG capsule  Anxiety - Plan: DULoxetine (CYMBALTA) 30 MG capsule, DULoxetine (CYMBALTA) 60 MG capsule, clonazePAM (KLONOPIN) 0.5 MG tablet  Primary insomnia - Plan: zolpidem (AMBIEN) 10 MG tablet  Discussed psychosocial stressors, collateral from other providers.  She still struggle with anxiety and chronic depression which is due to financial strain, husband may moved to out of state for his job.  She think about her chronic health issues.  She admitted taken few times Klonopin to help her anxiety.  She is taking now a new medication to help the hot flashes and anastrozole.  Discussed medication side effects and benefits.  She is no longer taking narcotic pain medicine but is still taking pain medicine and gabapentin.  I encourage consider EMDR to help her trauma therapy that can also help her coping skills.  Continue Cymbalta 90 mg daily, Ambien 10  mg at bedtime and Klonopin to take as needed for severe anxiety and panic attacks.  We will provide 10 tablets which usually last 2 to 3 months.  I recommend to call us back if she feels worsening of symptoms or any time having side effects from the medication.  Discussed safety concerns that if she has suicidal thoughts or homicidal thought then she need to call 911 or go  to local emergency room.    Follow Up Instructions:     I discussed the assessment and treatment plan with the patient. The patient was provided an opportunity to ask questions and all were answered. The patient agreed with the plan and demonstrated an understanding of the instructions.   The patient was advised to call back or seek an in-person evaluation if the symptoms worsen or if the condition fails to improve as anticipated.    Collaboration of Care: Other provider involved in patient's care AEB notes are available in epic to review.  Patient/Guardian was advised Release of Information must be obtained prior to any record release in order to collaborate their care with an outside provider. Patient/Guardian was advised if they have not already done so to contact the registration department to sign all necessary forms in order for [place] to release information regarding their care.   Consent: Patient/Guardian gives verbal consent for treatment and assignment of benefits for services provided during this visit. Patient/Guardian expressed understanding and agreed to proceed.     I provided 25 minutes of non face to face time during this encounter.  Note: This document was prepared by [employer] voice dictation technology and any errors that results from this process are unintentional.    Cleotis Nipper, MD 12/23/2022

## 2022-12-25 ENCOUNTER — Other Ambulatory Visit: Payer: 59

## 2022-12-25 ENCOUNTER — Ambulatory Visit
Admission: RE | Admit: 2022-12-25 | Discharge: 2022-12-25 | Disposition: A | Discharge status: Home / Self Care | Payer: 59 | Source: Ambulatory Visit | Attending: Hematology and Oncology | Admitting: Hematology and Oncology

## 2022-12-25 DIAGNOSIS — Z170 Estrogen receptor positive status [ER+]: Secondary | ICD-10-CM

## 2022-12-29 ENCOUNTER — Other Ambulatory Visit: Payer: Self-pay | Admitting: Hematology and Oncology

## 2022-12-29 DIAGNOSIS — R928 Other abnormal and inconclusive findings on diagnostic imaging of breast: Secondary | ICD-10-CM

## 2023-01-09 ENCOUNTER — Ambulatory Visit
Admission: RE | Admit: 2023-01-09 | Discharge: 2023-01-09 | Disposition: A | Discharge status: Home / Self Care | Payer: 59 | Source: Ambulatory Visit | Attending: Hematology and Oncology | Admitting: Hematology and Oncology

## 2023-01-09 ENCOUNTER — Other Ambulatory Visit: Payer: Self-pay | Admitting: Hematology and Oncology

## 2023-01-09 ENCOUNTER — Ambulatory Visit: Admission: RE | Admit: 2023-01-09 | Payer: 59 | Source: Ambulatory Visit

## 2023-01-09 ENCOUNTER — Ambulatory Visit: Payer: 59

## 2023-01-09 DIAGNOSIS — R928 Other abnormal and inconclusive findings on diagnostic imaging of breast: Secondary | ICD-10-CM

## 2023-01-15 ENCOUNTER — Ambulatory Visit
Admission: RE | Admit: 2023-01-15 | Discharge: 2023-01-15 | Disposition: A | Discharge status: Home / Self Care | Payer: 59 | Source: Ambulatory Visit | Attending: Hematology and Oncology | Admitting: Hematology and Oncology

## 2023-01-15 DIAGNOSIS — R928 Other abnormal and inconclusive findings on diagnostic imaging of breast: Secondary | ICD-10-CM

## 2023-01-15 HISTORY — PX: BREAST BIOPSY: SHX20

## 2023-01-20 ENCOUNTER — Other Ambulatory Visit: Payer: Self-pay | Admitting: *Deleted

## 2023-01-20 DIAGNOSIS — Z170 Estrogen receptor positive status [ER+]: Secondary | ICD-10-CM

## 2023-01-21 ENCOUNTER — Ambulatory Visit: Payer: 59 | Attending: Hematology and Oncology | Admitting: Physical Therapy

## 2023-01-21 ENCOUNTER — Other Ambulatory Visit: Payer: Self-pay

## 2023-01-21 ENCOUNTER — Encounter: Payer: Self-pay | Admitting: Physical Therapy

## 2023-01-21 DIAGNOSIS — C50412 Malignant neoplasm of upper-outer quadrant of left female breast: Secondary | ICD-10-CM | POA: Diagnosis present

## 2023-01-21 DIAGNOSIS — I890 Lymphedema, not elsewhere classified: Secondary | ICD-10-CM

## 2023-01-21 DIAGNOSIS — Z170 Estrogen receptor positive status [ER+]: Secondary | ICD-10-CM | POA: Diagnosis present
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
## 2023-01-22 ENCOUNTER — Encounter: Payer: Self-pay | Admitting: Hematology and Oncology

## 2023-01-23 ENCOUNTER — Encounter: Payer: Self-pay | Admitting: *Deleted

## 2023-01-23 NOTE — Progress Notes (Signed)
Received mychart message from pt requesting prescription pain medication to help tolerate Anastrozole.  Pt states she is experiencing severe joint pain, mainly in the hips as well as lower extremity edema.  Per MD pt needing to stop Anastrozole x2 weeks and f/u in office.  Pt educated and verbalized understanding.

## 2023-01-26 ENCOUNTER — Telehealth: Payer: Self-pay | Admitting: Adult Health

## 2023-01-26 NOTE — Telephone Encounter (Signed)
Scheduled appointment per staff message. Left voicemail. 

## 2023-01-27 ENCOUNTER — Telehealth: Payer: Self-pay | Admitting: Adult Health

## 2023-02-02 ENCOUNTER — Encounter: Payer: Self-pay | Admitting: Physical Therapy

## 2023-02-02 ENCOUNTER — Ambulatory Visit: Payer: 59 | Admitting: Physical Therapy

## 2023-02-02 DIAGNOSIS — Z170 Estrogen receptor positive status [ER+]: Secondary | ICD-10-CM

## 2023-02-02 DIAGNOSIS — I890 Lymphedema, not elsewhere classified: Secondary | ICD-10-CM
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
## 2023-02-05 ENCOUNTER — Ambulatory Visit: Payer: 59

## 2023-02-10 ENCOUNTER — Ambulatory Visit: Payer: 59 | Admitting: Adult Health

## 2023-02-10 ENCOUNTER — Ambulatory Visit: Payer: 59 | Admitting: Physical Therapy

## 2023-02-16 ENCOUNTER — Ambulatory Visit: Payer: 59 | Admitting: Physical Therapy

## 2023-02-16 ENCOUNTER — Inpatient Hospital Stay: Payer: 59 | Attending: Adult Health | Admitting: Adult Health

## 2023-02-16 ENCOUNTER — Other Ambulatory Visit: Payer: Self-pay

## 2023-02-16 VITALS — BP 130/68 | HR 97 | Temp 97.9°F | Resp 17 | Ht 68.0 in | Wt 197.4 lb

## 2023-02-16 DIAGNOSIS — M7062 Trochanteric bursitis, left hip: Secondary | ICD-10-CM | POA: Diagnosis not present

## 2023-02-16 DIAGNOSIS — C50412 Malignant neoplasm of upper-outer quadrant of left female breast: Secondary | ICD-10-CM

## 2023-02-16 DIAGNOSIS — Z90722 Acquired absence of ovaries, bilateral: Secondary | ICD-10-CM | POA: Diagnosis not present

## 2023-02-16 DIAGNOSIS — Z170 Estrogen receptor positive status [ER+]: Secondary | ICD-10-CM | POA: Diagnosis not present

## 2023-02-16 DIAGNOSIS — Z79811 Long term (current) use of aromatase inhibitors: Secondary | ICD-10-CM | POA: Diagnosis not present

## 2023-02-16 DIAGNOSIS — Z90710 Acquired absence of both cervix and uterus: Secondary | ICD-10-CM | POA: Diagnosis not present

## 2023-02-16 DIAGNOSIS — M7061 Trochanteric bursitis, right hip: Secondary | ICD-10-CM

## 2023-02-16 DIAGNOSIS — C50511 Malignant neoplasm of lower-outer quadrant of right female breast: Secondary | ICD-10-CM | POA: Insufficient documentation

## 2023-02-16 MED ORDER — METHYLPREDNISOLONE 4 MG PO TBPK
ORAL_TABLET | ORAL | 0 refills | Status: DC
Start: 2023-02-16 — End: 2023-03-02

## 2023-02-16 NOTE — Assessment & Plan Note (Addendum)
06/19/2017: Left lumpectomy: IDC with DCIS, 2.1 cm, 0/9 lymph nodes negative margins negative; ER 95%, PR 95%, HER-2 negative ratio 1.32, Ki-67 20%, T2N0 stage Ib;  Right lumpectomy: IDC with DCIS 1.8 cm, focally involving lateral and anterior margins, 0/3 lymph nodes negative, reexcision anterior and lateral margins DCIS, ER 100%, PR 20%, HER-2 negative ratio 1.23, Ki-67 3%, T1CN0 stage I a Adjuvant radiation therapy 08/13/2017-09/29/2017 Tamoxifen 2/19-10/2022 TAH/BSO 10/2022 Anastrozole 12/2022  Due to [name]'s increased symptoms she stopped taking the anastrozole in May 2024.  I recommended that she stay off of it for 2 weeks.  She has bilateral greater trochanteric bursitis.  I placed her on a steroid taper with a Medrol Dosepak.  We will also obtain bilateral hip x-rays including the pelvis.  She will follow-up in 2 weeks and we will discuss her discomfort and how she is feeling and decide at that time whether to resume anastrozole.
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
## 2023-02-20 ENCOUNTER — Ambulatory Visit (HOSPITAL_BASED_OUTPATIENT_CLINIC_OR_DEPARTMENT_OTHER)
Admission: RE | Admit: 2023-02-20 | Discharge: 2023-02-20 | Disposition: A | Discharge status: Home / Self Care | Payer: 59 | Source: Ambulatory Visit | Attending: Adult Health | Admitting: Adult Health

## 2023-02-20 DIAGNOSIS — C50412 Malignant neoplasm of upper-outer quadrant of left female breast: Secondary | ICD-10-CM | POA: Diagnosis present

## 2023-02-20 DIAGNOSIS — Z170 Estrogen receptor positive status [ER+]: Secondary | ICD-10-CM | POA: Insufficient documentation

## 2023-02-20 DIAGNOSIS — M7062 Trochanteric bursitis, left hip: Secondary | ICD-10-CM | POA: Insufficient documentation

## 2023-02-20 DIAGNOSIS — M7061 Trochanteric bursitis, right hip: Secondary | ICD-10-CM | POA: Diagnosis present

## 2023-03-02 ENCOUNTER — Inpatient Hospital Stay (HOSPITAL_BASED_OUTPATIENT_CLINIC_OR_DEPARTMENT_OTHER): Payer: 59 | Admitting: Adult Health

## 2023-03-02 ENCOUNTER — Encounter: Payer: Self-pay | Admitting: Adult Health

## 2023-03-02 ENCOUNTER — Other Ambulatory Visit: Payer: Self-pay

## 2023-03-02 VITALS — BP 114/73 | HR 91 | Temp 97.7°F | Resp 16 | Wt 191.3 lb

## 2023-03-02 DIAGNOSIS — C50412 Malignant neoplasm of upper-outer quadrant of left female breast: Secondary | ICD-10-CM

## 2023-03-02 DIAGNOSIS — Z170 Estrogen receptor positive status [ER+]: Secondary | ICD-10-CM

## 2023-03-02 MED ORDER — TRAMADOL HCL 50 MG PO TABS
50.0000 mg | ORAL_TABLET | Freq: Four times a day (QID) | ORAL | 0 refills | Status: DC | PRN
Start: 2023-03-02 — End: 2024-05-09

## 2023-03-02 NOTE — Assessment & Plan Note (Signed)
Margaret Rojas is a 47 year old woman with stage Ia ER/PR positive left breast invasive ductal carcinoma diagnosed in August 2018 status post lumpectomy, adjuvant radiation, tamoxifen status post recent TAH/BSO and began on anastrozole in February 2024.  Margaret Rojas will resume taking anastrozole at 1 mg daily.  She is status post TAH/BSO.  I reviewed with her that the arthralgias will likely still be present which she understands.  We discussed other options such as letrozole or exemestane.  I prescribed tramadol 50 mg p.o. every 6 hours as needed #30 for her arthralgias if they become severe enough to where she has difficulty sleeping.  We discussed goals of pain management today, PMP aware was reviewed and there are no red flags.  Margaret Rojas will return in February 2025 for follow-up with Dr. Pamelia Hoit.  She knows to call for any questions or concerns that may arise between now and that visit.
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
## 2023-03-18 ENCOUNTER — Other Ambulatory Visit (HOSPITAL_COMMUNITY): Payer: Self-pay | Admitting: Psychiatry

## 2023-03-18 DIAGNOSIS — F419 Anxiety disorder, unspecified: Secondary | ICD-10-CM

## 2023-03-18 DIAGNOSIS — F330 Major depressive disorder, recurrent, mild: Secondary | ICD-10-CM

## 2023-03-25 ENCOUNTER — Telehealth (HOSPITAL_COMMUNITY): Payer: 59 | Admitting: Psychiatry

## 2023-04-07 NOTE — Telephone Encounter (Signed)
Open in error

## 2023-04-27 ENCOUNTER — Telehealth (HOSPITAL_COMMUNITY): Payer: Self-pay

## 2023-04-27 ENCOUNTER — Telehealth (HOSPITAL_COMMUNITY): Payer: 59 | Admitting: Psychiatry

## 2023-04-27 DIAGNOSIS — F5101 Primary insomnia: Secondary | ICD-10-CM

## 2023-04-27 DIAGNOSIS — F419 Anxiety disorder, unspecified: Secondary | ICD-10-CM

## 2023-04-27 DIAGNOSIS — F330 Major depressive disorder, recurrent, mild: Secondary | ICD-10-CM

## 2023-04-27 MED ORDER — CLONAZEPAM 0.5 MG PO TABS
0.5000 mg | ORAL_TABLET | ORAL | 0 refills | Status: DC
Start: 2023-04-27 — End: 2023-07-14

## 2023-04-27 MED ORDER — ZOLPIDEM TARTRATE 10 MG PO TABS
5.0000 mg | ORAL_TABLET | Freq: Every evening | ORAL | 0 refills | Status: DC | PRN
Start: 2023-05-04 — End: 2023-06-08

## 2023-04-27 MED ORDER — DULOXETINE HCL 30 MG PO CPEP
30.0000 mg | ORAL_CAPSULE | ORAL | 0 refills | Status: DC
Start: 2023-04-27 — End: 2023-06-08

## 2023-04-27 MED ORDER — DULOXETINE HCL 60 MG PO CPEP
60.0000 mg | ORAL_CAPSULE | Freq: Every day | ORAL | 0 refills | Status: DC
Start: 2023-04-27 — End: 2023-06-08

## 2023-04-27 NOTE — Telephone Encounter (Signed)
I have sent clonazepam to pharmacy.  Will route this message to Dr.Arfeen to address once he is back in office.

## 2023-04-27 NOTE — Telephone Encounter (Signed)
I have sent zolpidem and duloxetine to patient's pharmacy at [facility].

## 2023-04-27 NOTE — Telephone Encounter (Signed)
pt had to r/s her appt today with dr. Alwyn Ren. pt was last seen on 4-9 next appt 9-23.  pt needs enough medication to get to her next appt. duloxetine 30 mg and 60mg , zolpidem,

## 2023-04-27 NOTE — Telephone Encounter (Signed)
called patient to notify that rxs' were sent to pharmacy bust she also wanted to have some clonazepam refill also.

## 2023-04-29 NOTE — Telephone Encounter (Signed)
checked with patient to make sure she did not have any issues with getting rx since it was dr. Elna Breslow coverming for dr. Armida Sans . pt states she got alerts that the medication was ready for pick up but if she has any issues she call office

## 2023-05-10 ENCOUNTER — Other Ambulatory Visit (HOSPITAL_COMMUNITY): Payer: Self-pay | Admitting: Psychiatry

## 2023-05-10 DIAGNOSIS — F419 Anxiety disorder, unspecified: Secondary | ICD-10-CM

## 2023-05-25 ENCOUNTER — Ambulatory Visit
Admission: RE | Admit: 2023-05-25 | Discharge: 2023-05-25 | Disposition: A | Discharge status: Home / Self Care | Payer: 59 | Source: Ambulatory Visit | Attending: Hematology and Oncology | Admitting: Hematology and Oncology

## 2023-05-25 DIAGNOSIS — Z780 Asymptomatic menopausal state: Secondary | ICD-10-CM

## 2023-06-02 ENCOUNTER — Ambulatory Visit
Admission: RE | Admit: 2023-06-02 | Discharge: 2023-06-02 | Disposition: A | Discharge status: Home / Self Care | Payer: 59 | Source: Ambulatory Visit | Attending: Hematology and Oncology | Admitting: Hematology and Oncology

## 2023-06-02 ENCOUNTER — Telehealth: Payer: Self-pay | Admitting: Hematology and Oncology

## 2023-06-02 DIAGNOSIS — Z170 Estrogen receptor positive status [ER+]: Secondary | ICD-10-CM

## 2023-06-02 MED ORDER — GADOPICLENOL 0.5 MMOL/ML IV SOLN
10.0000 mL | Freq: Once | INTRAVENOUS | Status: AC | PRN
  Administered 2023-06-02: 10 mL via INTRAVENOUS

## 2023-06-02 NOTE — Telephone Encounter (Signed)
I left a voicemail for the patient the breast MRI is normal

## 2023-06-08 ENCOUNTER — Encounter (HOSPITAL_COMMUNITY): Payer: Self-pay | Admitting: Psychiatry

## 2023-06-08 ENCOUNTER — Telehealth (HOSPITAL_BASED_OUTPATIENT_CLINIC_OR_DEPARTMENT_OTHER): Payer: 59 | Admitting: Psychiatry

## 2023-06-08 VITALS — Wt 191.0 lb

## 2023-06-08 DIAGNOSIS — F419 Anxiety disorder, unspecified: Secondary | ICD-10-CM | POA: Diagnosis not present

## 2023-06-08 DIAGNOSIS — F330 Major depressive disorder, recurrent, mild: Secondary | ICD-10-CM

## 2023-06-08 DIAGNOSIS — F5101 Primary insomnia: Secondary | ICD-10-CM

## 2023-06-08 MED ORDER — ZOLPIDEM TARTRATE 10 MG PO TABS
5.0000 mg | ORAL_TABLET | Freq: Every evening | ORAL | 0 refills | Status: DC | PRN
Start: 2023-06-08 — End: 2023-07-24

## 2023-06-08 MED ORDER — AMITRIPTYLINE HCL 10 MG PO TABS
10.0000 mg | ORAL_TABLET | Freq: Every day | ORAL | 0 refills | Status: DC
Start: 2023-06-08 — End: 2023-07-13

## 2023-06-08 MED ORDER — DULOXETINE HCL 30 MG PO CPEP
30.0000 mg | ORAL_CAPSULE | ORAL | 0 refills | Status: DC
Start: 2023-06-08 — End: 2023-09-21

## 2023-06-08 MED ORDER — DULOXETINE HCL 60 MG PO CPEP
60.0000 mg | ORAL_CAPSULE | Freq: Every day | ORAL | 0 refills | Status: DC
Start: 2023-06-08 — End: 2023-09-21

## 2023-06-08 NOTE — Progress Notes (Addendum)
[facility] MD Virtual Progress Note   Patient Location: In Car Provider Location: Home Office  I connect with patient by video and verified that I am speaking with correct person by using two identifiers. I discussed the limitations of evaluation and management by telemedicine and the availability of in person appointments. I also discussed with the patient that there may be a patient responsible charge related to this service. The patient expressed understanding and agreed to proceed.  Margaret Rojas [id number] 47 y.o.  06/08/2023 10:35 AM  History of Present Illness:  Patient is evaluated by video session.  She is in the car.  She reported things are somewhat better as husband was able to get the job locally.  She still struggles sometimes with sleep and having hot flashes.  Despite taking Ambien as needed sometimes she feels it is not working as good.  She reported her anxiety and depression is under control.  She is taking Cymbalta 90 mg every day.  Her 47 year old and 47 year old are now in school and she trying to make sure that 47 year old get more and independent.  She denies any major panic attack.  She denies any crying spells or any feeling of hopelessness or worthlessness.  She reported job is going okay and manageable.  Her appetite is okay.  Her weight is stable.  She reported sometimes feeling overwhelmed as taking care of parents.  She has no tremors, shakes or any EPS.  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  Past Psychiatric History: H/O depression since 2005.  Took Lexapro while pregnant, Zoloft until switched to Effexor in November 2018 when diagnosed with breast cancer.  We switched to Cymbalta for better control on her chronic pain.  Tried Xanax and Lunesta (d/c metallic taste). No history of suicidal attempt or inpatient psychiatric treatment.    Outpatient Encounter Medications as of 06/08/2023  Medication Sig   anastrozole  (ARIMIDEX) 1 MG tablet Take 1 tablet (1 mg total) by mouth daily.   clonazePAM (KLONOPIN) 0.5 MG tablet Take 1 tablet (0.5 mg total) by mouth as directed. Take 1 tablet once a day as needed only for severe panic attacks, please limit use.   cyclobenzaprine (FLEXERIL) 10 MG tablet Take 10 mg by mouth 2 (two) times daily as needed (migraines).   DULoxetine (CYMBALTA) 30 MG capsule Take 1 capsule (30 mg total) by mouth See admin instructions. Take with 60 mg for a total of 90 mg in the morning   DULoxetine (CYMBALTA) 60 MG capsule Take 1 capsule (60 mg total) by mouth daily. Take along with 30 mg daily   ELDERBERRY PO Take 3 capsules by mouth at bedtime.   gabapentin (NEURONTIN) 300 MG capsule Take 1 capsule (300 mg total) by mouth 2 (two) times daily.   ibuprofen (ADVIL) 200 MG tablet Take 600 mg by mouth every 6 (six) hours as needed for headache.   topiramate (TOPAMAX) 100 MG tablet Take 100 mg by mouth at bedtime.   traMADol (ULTRAM) 50 MG tablet Take 1 tablet (50 mg total) by mouth every 6 (six) hours as needed for moderate pain or severe pain.   VEOZAH 45 MG TABS Take 1 tablet by mouth daily.   zolmitriptan (ZOMIG) 5 MG tablet Take 5 mg by mouth as needed for migraine.   zolpidem (AMBIEN) 10 MG tablet Take 0.5-1 tablets (5-10 mg total) by mouth at bedtime as needed for sleep.   No facility-administered encounter medications on  file as of 06/08/2023.    No results found for this or any previous visit (from the past 2160 hour(s)).   Psychiatric Specialty Exam: Physical Exam  Review of Systems  Weight 191 lb (86.6 kg).There is no height or weight on file to calculate BMI.  General Appearance: Casual  Eye Contact:  Good  Speech:  Clear and Coherent  Volume:  Normal  Mood:  Anxious  Affect:  Appropriate  Thought Process:  Goal Directed  Orientation:  Full (Time, Place, and Person)  Thought Content:  WDL  Suicidal Thoughts:  No  Homicidal Thoughts:  No  Memory:  Immediate;    Good Recent;   Good Remote;   Good  Judgement:  Intact  Insight:  Present  Psychomotor Activity:  Normal  Concentration:  Concentration: Good and Attention Span: Good  Recall:  Good  Fund of Knowledge:  Good  Language:  Good  Akathisia:  No  Handed:  Right  AIMS (if indicated):     Assets:  Communication Skills Desire for Improvement Housing Resilience Social Support Talents/Skills Transportation  ADL's:  Intact  Cognition:  WNL  Sleep:  fair. Having hot flashes     Assessment/Plan: MDD (major depressive disorder), recurrent episode, mild (HCC) - Plan: amitriptyline (ELAVIL) 10 MG tablet, DULoxetine (CYMBALTA) 30 MG capsule  Primary insomnia - Plan: zolpidem (AMBIEN) 10 MG tablet, amitriptyline (ELAVIL) 10 MG tablet  Anxiety - Plan: amitriptyline (ELAVIL) 10 MG tablet, DULoxetine (CYMBALTA) 60 MG capsule, DULoxetine (CYMBALTA) 30 MG capsule  Discussed chronic insomnia.  Patient recalled had a sleep study 10 years ago but did not remember any abnormal findings or given diagnosis.  She is taking gabapentin 600 mg at bedtime from other provider to help her hot flashes.  She really does not want to increase the gabapentin due to concern of the side effects.  She had tried Ambien CR and Lunesta that did not work.  She takes Klonopin as needed for severe anxiety and the last was taken few weeks ago.  I recommend to consider adding a low dose amitriptyline to help with anxiety and sleep.  She agreed with the plan.  We will try 10-20 mg amitriptyline to see if that helps.  If amitriptyline works and then she will not need the Ambien.  Continue Cymbalta 90 mg daily.  Discussed possible medication side effects including anticholinergic, tremors, dry mouth.  Follow-up in 3 months.   Follow Up Instructions:     I discussed the assessment and treatment plan with the patient. The patient was provided an opportunity to ask questions and all were answered. The patient agreed with the plan and  demonstrated an understanding of the instructions.   The patient was advised to call back or seek an in-person evaluation if the symptoms worsen or if the condition fails to improve as anticipated.    Collaboration of Care: Other provider involved in patient's care AEB notes are available in epic to review  Patient/Guardian was advised Release of Information must be obtained prior to any record release in order to collaborate their care with an outside provider. Patient/Guardian was advised if they have not already done so to contact the registration department to sign all necessary forms in order for [place] to release information regarding their care.   Consent: Patient/Guardian gives verbal consent for treatment and assignment of benefits for services provided during this visit. Patient/Guardian expressed understanding and agreed to proceed.     I provided 25 minutes of non face to  face time during this encounter.  Note: This document was prepared by [employer] voice dictation technology and any errors that results from this process are unintentional.    Cleotis Nipper, MD 06/08/2023

## 2023-07-09 ENCOUNTER — Other Ambulatory Visit (HOSPITAL_COMMUNITY): Payer: Self-pay | Admitting: Psychiatry

## 2023-07-09 DIAGNOSIS — F419 Anxiety disorder, unspecified: Secondary | ICD-10-CM

## 2023-07-12 ENCOUNTER — Other Ambulatory Visit (HOSPITAL_COMMUNITY): Payer: Self-pay | Admitting: Psychiatry

## 2023-07-12 DIAGNOSIS — F330 Major depressive disorder, recurrent, mild: Secondary | ICD-10-CM

## 2023-07-12 DIAGNOSIS — F419 Anxiety disorder, unspecified: Secondary | ICD-10-CM

## 2023-07-12 DIAGNOSIS — F5101 Primary insomnia: Secondary | ICD-10-CM

## 2023-07-13 ENCOUNTER — Other Ambulatory Visit (HOSPITAL_COMMUNITY): Payer: Self-pay | Admitting: *Deleted

## 2023-07-13 DIAGNOSIS — F330 Major depressive disorder, recurrent, mild: Secondary | ICD-10-CM

## 2023-07-13 DIAGNOSIS — F419 Anxiety disorder, unspecified: Secondary | ICD-10-CM

## 2023-07-13 DIAGNOSIS — F5101 Primary insomnia: Secondary | ICD-10-CM

## 2023-07-13 MED ORDER — AMITRIPTYLINE HCL 10 MG PO TABS: 10.0000 mg | ORAL_TABLET | Freq: Every day | ORAL | 0 refills | Status: DC

## 2023-07-14 ENCOUNTER — Telehealth (HOSPITAL_COMMUNITY): Payer: Self-pay

## 2023-07-14 DIAGNOSIS — F419 Anxiety disorder, unspecified: Secondary | ICD-10-CM

## 2023-07-14 MED ORDER — CLONAZEPAM 0.5 MG PO TABS: 0.5000 mg | ORAL_TABLET | Freq: Every day | ORAL | 0 refills | Status: DC | PRN

## 2023-07-14 NOTE — Telephone Encounter (Signed)
Patients pharmacy called to clarify the instructions on patients Klonopin - it says to see attached for directions and nothing was attached. Pharmacist would like you to resend with the correct directions. Please review and advise, thank you

## 2023-07-14 NOTE — Telephone Encounter (Signed)
Sent again

## 2023-07-23 ENCOUNTER — Other Ambulatory Visit (HOSPITAL_COMMUNITY): Payer: Self-pay | Admitting: Psychiatry

## 2023-07-23 DIAGNOSIS — F5101 Primary insomnia: Secondary | ICD-10-CM

## 2023-07-24 ENCOUNTER — Telehealth (HOSPITAL_COMMUNITY): Payer: Self-pay | Admitting: *Deleted

## 2023-07-24 DIAGNOSIS — F5101 Primary insomnia: Secondary | ICD-10-CM

## 2023-07-24 MED ORDER — ZOLPIDEM TARTRATE 10 MG PO TABS: 5.0000 mg | ORAL_TABLET | Freq: Every evening | ORAL | 0 refills | Status: DC | PRN

## 2023-07-24 NOTE — Telephone Encounter (Signed)
Pt called requesting refill of the Ambien 10 mg which was last e-scribed for #30 with no refills on 06/08/23. That was also pt's last visit date. She has a f/u scheduled for 09/07/23. Please review.

## 2023-07-24 NOTE — Telephone Encounter (Signed)
I will send the Ambien.  Patient also taking Klonopin and amitriptyline and gabapentin these all medication can cause sedation.  We have to address polypharmacy on her next appointment.

## 2023-08-06 ENCOUNTER — Other Ambulatory Visit: Payer: Self-pay | Admitting: Neurosurgery

## 2023-08-06 DIAGNOSIS — M5430 Sciatica, unspecified side: Secondary | ICD-10-CM

## 2023-08-12 ENCOUNTER — Ambulatory Visit
Admission: RE | Admit: 2023-08-12 | Discharge: 2023-08-12 | Disposition: A | Discharge status: Home / Self Care | Payer: 59 | Source: Ambulatory Visit | Attending: Neurosurgery | Admitting: Neurosurgery

## 2023-08-12 DIAGNOSIS — M5430 Sciatica, unspecified side: Secondary | ICD-10-CM

## 2023-08-21 ENCOUNTER — Encounter: Payer: Self-pay | Admitting: Neurology

## 2023-08-25 ENCOUNTER — Other Ambulatory Visit (HOSPITAL_COMMUNITY): Payer: Self-pay | Admitting: Psychiatry

## 2023-08-25 DIAGNOSIS — F330 Major depressive disorder, recurrent, mild: Secondary | ICD-10-CM

## 2023-08-25 DIAGNOSIS — F419 Anxiety disorder, unspecified: Secondary | ICD-10-CM

## 2023-08-25 DIAGNOSIS — F5101 Primary insomnia: Secondary | ICD-10-CM

## 2023-08-27 ENCOUNTER — Other Ambulatory Visit (HOSPITAL_COMMUNITY): Payer: Self-pay | Admitting: *Deleted

## 2023-08-27 ENCOUNTER — Telehealth (HOSPITAL_COMMUNITY): Payer: Self-pay | Admitting: *Deleted

## 2023-08-27 DIAGNOSIS — F330 Major depressive disorder, recurrent, mild: Secondary | ICD-10-CM

## 2023-08-27 DIAGNOSIS — F5101 Primary insomnia: Secondary | ICD-10-CM

## 2023-08-27 DIAGNOSIS — F419 Anxiety disorder, unspecified: Secondary | ICD-10-CM

## 2023-08-27 MED ORDER — AMITRIPTYLINE HCL 10 MG PO TABS: 10.0000 mg | ORAL_TABLET | Freq: Every evening | ORAL | 0 refills | Status: DC | PRN

## 2023-08-27 MED ORDER — ZOLPIDEM TARTRATE 10 MG PO TABS: 5.0000 mg | ORAL_TABLET | Freq: Every evening | ORAL | 0 refills | Status: DC | PRN

## 2023-08-27 MED ORDER — CLONAZEPAM 0.5 MG PO TABS: 0.5000 mg | ORAL_TABLET | Freq: Every day | ORAL | 0 refills | Status: DC | PRN

## 2023-08-27 NOTE — Telephone Encounter (Signed)
Pt called requesting refills of thee Ambien, Elavil, and Klonopin. Pt says she will be going out of town and wants to make sure she has enough meds. Also stated that she's meet her deductible and wanted to see if insurance would cover 1 more Klonopin script. Pt also questioned quantity of last refills. Pt was seen on 06/08/23 and has f/u on 09/07/23.please review.

## 2023-08-27 NOTE — Telephone Encounter (Signed)
I have sent all 3 refills however discussed the dosage quantity and polypharmacy on her next appointment.  Please ask to keep the appointment on 23rd.

## 2023-09-07 ENCOUNTER — Telehealth (HOSPITAL_COMMUNITY): Payer: 59 | Admitting: Psychiatry

## 2023-09-15 ENCOUNTER — Other Ambulatory Visit (HOSPITAL_COMMUNITY): Payer: Self-pay | Admitting: Psychiatry

## 2023-09-15 DIAGNOSIS — F330 Major depressive disorder, recurrent, mild: Secondary | ICD-10-CM

## 2023-09-15 DIAGNOSIS — F419 Anxiety disorder, unspecified: Secondary | ICD-10-CM

## 2023-09-20 ENCOUNTER — Other Ambulatory Visit (HOSPITAL_COMMUNITY): Payer: Self-pay | Admitting: Psychiatry

## 2023-09-20 DIAGNOSIS — F5101 Primary insomnia: Secondary | ICD-10-CM

## 2023-09-20 DIAGNOSIS — F419 Anxiety disorder, unspecified: Secondary | ICD-10-CM

## 2023-09-20 DIAGNOSIS — F330 Major depressive disorder, recurrent, mild: Secondary | ICD-10-CM

## 2023-09-21 ENCOUNTER — Telehealth (HOSPITAL_COMMUNITY): Payer: 59 | Admitting: Psychiatry

## 2023-09-21 ENCOUNTER — Encounter (HOSPITAL_COMMUNITY): Payer: Self-pay | Admitting: Psychiatry

## 2023-09-21 VITALS — Wt 188.0 lb

## 2023-09-21 DIAGNOSIS — F330 Major depressive disorder, recurrent, mild: Secondary | ICD-10-CM

## 2023-09-21 DIAGNOSIS — F5101 Primary insomnia: Secondary | ICD-10-CM | POA: Diagnosis not present

## 2023-09-21 DIAGNOSIS — F419 Anxiety disorder, unspecified: Secondary | ICD-10-CM

## 2023-09-21 MED ORDER — AMITRIPTYLINE HCL 25 MG PO TABS: 25.0000 mg | ORAL_TABLET | Freq: Every day | ORAL | 0 refills | Status: DC

## 2023-09-21 MED ORDER — DULOXETINE HCL 30 MG PO CPEP
30.0000 mg | ORAL_CAPSULE | ORAL | 0 refills | Status: DC
Start: 2023-09-21 — End: 2023-12-18

## 2023-09-21 MED ORDER — DULOXETINE HCL 60 MG PO CPEP: 60.0000 mg | ORAL_CAPSULE | Freq: Every day | ORAL | 0 refills | Status: DC

## 2023-09-21 MED ORDER — CLONAZEPAM 0.25 MG PO TBDP: 0.2500 mg | ORAL_TABLET | ORAL | 0 refills | Status: DC | PRN

## 2023-09-21 NOTE — Progress Notes (Signed)
 [facility] MD Virtual Progress Note   Patient Location: In Car Provider Location: Home Office  I connect with patient by video and verified that I am speaking with correct person by using two identifiers. I discussed the limitations of evaluation and management by telemedicine and the availability of in person appointments. I also discussed with the patient that there may be a patient responsible charge related to this service. The patient expressed understanding and agreed to proceed.  Margaret Rojas [id number] 48 y.o.  09/21/2023 11:40 AM  History of Present Illness:  Patient is evaluated by video session.  She reported had a trip to [place] for the Christmas which went well.  However before the trip her father had a fall.  Her brother came to help the father as mother has dementia.  Patient told after she returned from vacation she stayed with the father for 6 days.  Patient reported he is doing better but patient still have a lot of anxiety and nervousness about her parents.  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  She is pleased husband is working from home and able to cooperate and supportive when needed.  She admitted sleep is still issue as sometimes she takes one fourth of Klonopin  along with Ambien  and sometime 1 to 2 pill of amitriptyline  with the Ambien .  She admitted a lot of racing thoughts and anxiety before she go to sleep.  She also reported backache and going to have a CT scan may soon.  She has not seen her PCP since January 2024.  Her headaches are much better and she is cutting down the Topamax  but is still taking gabapentin .  She started sugar-free diet and able to lost few pounds.  She denies any crying spells or any feeling of hopelessness or worthlessness.  She denies any tremors, shakes or any EPS.  She is compliant with Cymbalta  90 mg.  Past Psychiatric History: H/O depression since 2005.  Took Lexapro while pregnant, Zoloft  until  switched to Effexor  in November 2018 when diagnosed with breast cancer.  We switched to Cymbalta  for better control on her chronic pain.  Tried Xanax  and Lunesta  (d/c metallic taste). No history of suicidal attempt or inpatient psychiatric treatment.    Outpatient Encounter Medications as of 09/21/2023  Medication Sig   amitriptyline  (ELAVIL ) 10 MG tablet Take 1-2 tablets (10-20 mg total) by mouth at bedtime.   amitriptyline  (ELAVIL ) 10 MG tablet Take 1 tablet (10 mg total) by mouth at bedtime and may repeat dose one time if needed.   anastrozole  (ARIMIDEX ) 1 MG tablet Take 1 tablet (1 mg total) by mouth daily.   clonazePAM  (KLONOPIN ) 0.5 MG tablet Take 1 tablet (0.5 mg total) by mouth daily as needed for anxiety (for severe anxiety attack).   cyclobenzaprine  (FLEXERIL ) 10 MG tablet Take 10 mg by mouth 2 (two) times daily as needed (migraines).   DULoxetine  (CYMBALTA ) 30 MG capsule Take 1 capsule (30 mg total) by mouth See admin instructions. Take with 60 mg for a total of 90 mg in the morning   DULoxetine  (CYMBALTA ) 60 MG capsule Take 1 capsule (60 mg total) by mouth daily. Take along with 30 mg daily   ELDERBERRY PO Take 3 capsules by mouth at bedtime.   gabapentin  (NEURONTIN ) 300 MG capsule Take 1 capsule (300 mg total) by mouth 2 (two) times daily.   ibuprofen  (ADVIL ) 200 MG tablet Take 600 mg by mouth every 6 (six) hours as needed  for headache.   topiramate  (TOPAMAX ) 100 MG tablet Take 100 mg by mouth at bedtime.   traMADol  (ULTRAM ) 50 MG tablet Take 1 tablet (50 mg total) by mouth every 6 (six) hours as needed for moderate pain or severe pain.   VEOZAH 45 MG TABS Take 1 tablet by mouth daily.   zolmitriptan (ZOMIG) 5 MG tablet Take 5 mg by mouth as needed for migraine.   zolpidem  (AMBIEN ) 10 MG tablet Take 0.5-1 tablets (5-10 mg total) by mouth at bedtime as needed for sleep.   No facility-administered encounter medications on file as of 09/21/2023.    No results found for this or any  previous visit (from the past 2160 hours).   Psychiatric Specialty Exam: Physical Exam  Review of Systems  Weight 188 lb (85.3 kg).There is no height or weight on file to calculate BMI.  General Appearance: Casual  Eye Contact:  Good  Speech:  Clear and Coherent and Normal Rate  Volume:  Normal  Mood:  Euthymic  Affect:  Appropriate  Thought Process:  Goal Directed  Orientation:  Full (Time, Place, and Person)  Thought Content:  Rumination  Suicidal Thoughts:  No  Homicidal Thoughts:  No  Memory:  Immediate;   Good Recent;   Good Remote;   Good  Judgement:  Good  Insight:  Good  Psychomotor Activity:  Normal  Concentration:  Concentration: Good and Attention Span: Good  Recall:  Good  Fund of Knowledge:  Good  Language:  Good  Akathisia:  No  Handed:  Right  AIMS (if indicated):     Assets:  Communication Skills Desire for Improvement Housing Social Support Talents/Skills Transportation  ADL's:  Intact  Cognition:  WNL  Sleep:  ok     Assessment/Plan: MDD (major depressive disorder), recurrent episode, mild (HCC) - Plan: amitriptyline  (ELAVIL ) 25 MG tablet, DULoxetine  (CYMBALTA ) 30 MG capsule  Primary insomnia - Plan: amitriptyline  (ELAVIL ) 25 MG tablet  Anxiety - Plan: amitriptyline  (ELAVIL ) 25 MG tablet, DULoxetine  (CYMBALTA ) 60 MG capsule, DULoxetine  (CYMBALTA ) 30 MG capsule, clonazePAM  (KLONOPIN ) 0.25 MG disintegrating tablet  Discussed chronic insomnia.  Patient to call had a sleep study but did not have a diagnosis of apnea.  Currently she is taking amitriptyline  10-20 mg as needed along with Ambien  10 mg and Klonopin  0.5 mg one fourth as needed to help her anxiety and sleep.  She also on moderate dose of Cymbalta .  I discussed rather taking polypharmacy for sleep should consider the therapeutic dose and she agreed with the plan.  Recommend to discontinue Ambien  as it is only for insomnia and does not have indication for anxiety.  I recommend to consider going  up on amitriptyline  25 mg but to take it every night to help her sleep which could be due to a lot of anxiety.  I also changed the Klonopin  from 0.5 mg to 0.25 mg dissolving tablet to take as needed for panic attack as sometimes she is taking one fourth of the Klonopin  0.5.  She admitted sometime not able to cut the pill very well.  I suggested should take the Klonopin  0.25 mg only for severe anxiety and tried to avoid taking multiple medication.  Discussed sleep hygiene.  Discussed hypnotics and benzodiazepine dependency, tolerance and withdrawal.  Patient admitted seeing her parents especially mother has dementia now wants to consider the medication that have less long-term side effects.  She will continue Cymbalta  90 mg daily.  Encouraged walking, exercise.  She is going to start  sugar-free diet again since she able to lost weight.  Recommended to call us  back if she has any question, concern or if she feels worsening of the symptoms.  We will follow-up in 6 weeks.   Follow Up Instructions:     I discussed the assessment and treatment plan with the patient. The patient was provided an opportunity to ask questions and all were answered. The patient agreed with the plan and demonstrated an understanding of the instructions.   The patient was advised to call back or seek an in-person evaluation if the symptoms worsen or if the condition fails to improve as anticipated.    Collaboration of Care: Other provider involved in patient's care AEB notes are available in epic to review  Patient/Guardian was advised Release of Information must be obtained prior to any record release in order to collaborate their care with an outside provider. Patient/Guardian was advised if they have not already done so to contact the registration department to sign all necessary forms in order for us  to release information regarding their care.   Consent: Patient/Guardian gives verbal consent for treatment and assignment of  benefits for services provided during this visit. Patient/Guardian expressed understanding and agreed to proceed.     I provided 28 minutes of non face to face time during this encounter.  Note: This document was prepared by [employer] voice dictation technology and any errors that results from this process are unintentional.    Leni ONEIDA Client, MD 09/21/2023
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
## 2023-10-07 ENCOUNTER — Encounter: Payer: Self-pay | Admitting: Neurology

## 2023-10-07 ENCOUNTER — Ambulatory Visit: Payer: 59 | Admitting: Neurology

## 2023-10-12 ENCOUNTER — Other Ambulatory Visit (HOSPITAL_COMMUNITY): Payer: Self-pay | Admitting: Psychiatry

## 2023-10-12 DIAGNOSIS — F5101 Primary insomnia: Secondary | ICD-10-CM

## 2023-10-12 DIAGNOSIS — F330 Major depressive disorder, recurrent, mild: Secondary | ICD-10-CM

## 2023-10-12 DIAGNOSIS — F419 Anxiety disorder, unspecified: Secondary | ICD-10-CM

## 2023-10-13 ENCOUNTER — Ambulatory Visit: Payer: 59 | Admitting: Neurology

## 2023-10-13 ENCOUNTER — Other Ambulatory Visit (HOSPITAL_COMMUNITY): Payer: Self-pay | Admitting: Psychiatry

## 2023-10-13 ENCOUNTER — Encounter: Payer: Self-pay | Admitting: Neurology

## 2023-10-13 ENCOUNTER — Telehealth (HOSPITAL_COMMUNITY): Payer: Self-pay | Admitting: *Deleted

## 2023-10-13 VITALS — BP 103/70 | HR 88 | Ht 68.0 in | Wt 201.0 lb

## 2023-10-13 DIAGNOSIS — F330 Major depressive disorder, recurrent, mild: Secondary | ICD-10-CM

## 2023-10-13 DIAGNOSIS — F419 Anxiety disorder, unspecified: Secondary | ICD-10-CM

## 2023-10-13 DIAGNOSIS — F5101 Primary insomnia: Secondary | ICD-10-CM

## 2023-10-13 DIAGNOSIS — R202 Paresthesia of skin: Secondary | ICD-10-CM | POA: Diagnosis not present

## 2023-10-13 MED ORDER — AMITRIPTYLINE HCL 50 MG PO TABS: 50.0000 mg | ORAL_TABLET | Freq: Every day | ORAL | 0 refills | Status: DC
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
## 2023-10-13 NOTE — Telephone Encounter (Signed)
Pt advised and verbalizes understanding.

## 2023-10-13 NOTE — Telephone Encounter (Signed)
Pt called stating the Elavil 25 mg at bedtime is not helping with sleep. Pt would like to try a higher dose. Ambien was d/c on last visit on 09/21/23. Please review.

## 2023-10-13 NOTE — Telephone Encounter (Signed)
I have sent a new prescription of amitriptyline 50 mg.  She can take 1 to 2 pill at bedtime.  This medicine can cause dizziness so she need to be careful and take the medicine at bedtime.

## 2023-10-26 ENCOUNTER — Encounter: Payer: 59 | Admitting: Family Medicine

## 2023-11-04 ENCOUNTER — Other Ambulatory Visit (HOSPITAL_COMMUNITY): Payer: Self-pay | Admitting: Psychiatry

## 2023-11-04 DIAGNOSIS — F5101 Primary insomnia: Secondary | ICD-10-CM

## 2023-11-04 DIAGNOSIS — F330 Major depressive disorder, recurrent, mild: Secondary | ICD-10-CM

## 2023-11-04 DIAGNOSIS — F419 Anxiety disorder, unspecified: Secondary | ICD-10-CM

## 2023-11-11 ENCOUNTER — Other Ambulatory Visit (HOSPITAL_COMMUNITY): Payer: Self-pay | Admitting: *Deleted

## 2023-11-11 ENCOUNTER — Inpatient Hospital Stay: Payer: 59 | Attending: Hematology and Oncology | Admitting: Hematology and Oncology

## 2023-11-11 ENCOUNTER — Inpatient Hospital Stay: Payer: 59

## 2023-11-11 VITALS — BP 110/76 | HR 88 | Temp 98.5°F | Resp 17 | Ht 68.0 in | Wt 201.8 lb

## 2023-11-11 DIAGNOSIS — C50412 Malignant neoplasm of upper-outer quadrant of left female breast: Secondary | ICD-10-CM | POA: Diagnosis present

## 2023-11-11 DIAGNOSIS — C50511 Malignant neoplasm of lower-outer quadrant of right female breast: Secondary | ICD-10-CM | POA: Insufficient documentation

## 2023-11-11 DIAGNOSIS — Z882 Allergy status to sulfonamides status: Secondary | ICD-10-CM | POA: Diagnosis not present

## 2023-11-11 DIAGNOSIS — Z79899 Other long term (current) drug therapy: Secondary | ICD-10-CM | POA: Diagnosis not present

## 2023-11-11 DIAGNOSIS — Z1732 Human epidermal growth factor receptor 2 negative status: Secondary | ICD-10-CM | POA: Diagnosis not present

## 2023-11-11 DIAGNOSIS — N951 Menopausal and female climacteric states: Secondary | ICD-10-CM | POA: Diagnosis not present

## 2023-11-11 DIAGNOSIS — F419 Anxiety disorder, unspecified: Secondary | ICD-10-CM

## 2023-11-11 DIAGNOSIS — F5101 Primary insomnia: Secondary | ICD-10-CM

## 2023-11-11 DIAGNOSIS — Z79810 Long term (current) use of selective estrogen receptor modulators (SERMs): Secondary | ICD-10-CM | POA: Diagnosis not present

## 2023-11-11 DIAGNOSIS — Z90721 Acquired absence of ovaries, unilateral: Secondary | ICD-10-CM | POA: Insufficient documentation

## 2023-11-11 DIAGNOSIS — Z170 Estrogen receptor positive status [ER+]: Secondary | ICD-10-CM | POA: Insufficient documentation

## 2023-11-11 DIAGNOSIS — Z79811 Long term (current) use of aromatase inhibitors: Secondary | ICD-10-CM | POA: Diagnosis not present

## 2023-11-11 DIAGNOSIS — Z1721 Progesterone receptor positive status: Secondary | ICD-10-CM | POA: Diagnosis not present

## 2023-11-11 DIAGNOSIS — F330 Major depressive disorder, recurrent, mild: Secondary | ICD-10-CM

## 2023-11-11 LAB — CMP (CANCER CENTER ONLY)
ALT: 41 U/L (ref 0–44)
AST: 41 U/L (ref 15–41)
Albumin: 4.3 g/dL (ref 3.5–5.0)
Alkaline Phosphatase: 92 U/L (ref 38–126)
Anion gap: 8 (ref 5–15)
BUN: 28 mg/dL — ABNORMAL HIGH (ref 6–20)
CO2: 27 mmol/L (ref 22–32)
Calcium: 9.1 mg/dL (ref 8.9–10.3)
Chloride: 105 mmol/L (ref 98–111)
Creatinine: 0.92 mg/dL (ref 0.44–1.00)
GFR, Estimated: >60 mL/min (ref >60)
Glucose, Bld: 93 mg/dL (ref 70–99)
Potassium: 3.9 mmol/L (ref 3.5–5.1)
Sodium: 140 mmol/L (ref 135–145)
Total Bilirubin: 0.3 mg/dL (ref 0.0–1.2)
Total Protein: 7.4 g/dL (ref 6.5–8.1)

## 2023-11-11 MED ORDER — ANASTROZOLE 1 MG PO TABS: 1.0000 mg | ORAL_TABLET | Freq: Every day | ORAL | 3 refills | Status: AC

## 2023-11-11 MED ORDER — AMITRIPTYLINE HCL 50 MG PO TABS: 50.0000 mg | ORAL_TABLET | Freq: Every day | ORAL | 0 refills | Status: DC

## 2023-11-11 NOTE — Progress Notes (Unsigned)
 Patient Care Team: Tower, [name], MD as PCP - General (Family Medicine) Emelia Loron, MD as Consulting Physician (General Surgery) Serena Croissant, MD as Consulting Physician (Hematology and Oncology) Dorothy Puffer, MD as Consulting Physician (Radiation Oncology) Axel Filler Larna Daughters, NP as Nurse Practitioner (Hematology and Oncology) Glendale Chard, DO as Consulting Physician (Neurology)  DIAGNOSIS:  Encounter Diagnosis  Name Primary?   Malignant neoplasm of upper-outer quadrant of left breast in female, estrogen receptor positive (HCC) Yes    SUMMARY OF ONCOLOGIC HISTORY: Oncology History  Malignant neoplasm of upper-outer quadrant of left breast in female, estrogen receptor positive (HCC)  05/13/2017 Initial Diagnosis   Palpable left breast mass with distortion at 1:30 position: 1.1 cm with left axillary lymph node, 3.5 mm cortex, biopsy tubular 05/21/2017. Biopsy of the breast mass revealed grade 1-2 IDC with DCIS ER 95%, PR 95%, Ki-67 20%, HER-2 negative ratio 1.32 T1c N0 stage IA   05/26/2017 Genetic Testing   Negative genetic testing on the 9-gene STAT panel.The STAT Breast cancer panel offered by [facility] includes sequencing and rearrangement analysis for the following 9 genes:  ATM, BRCA1, BRCA2, CDH1, CHEK2, PALB2, PTEN, STK11 and TP53.   The report date is 05/26/2017.  Negative genetic testing on the reflexed common hereditary cancer panel.  The Hereditary Gene Panel offered by [facility] includes sequencing and/or deletion duplication testing of the following 46 genes: APC, ATM, AXIN2, BARD1, BMPR1A, BRCA1, BRCA2, BRIP1, CDH1, CDKN2A (p14ARF), CDKN2A (p16INK4a), CHEK2, CTNNA1, DICER1, EPCAM (Deletion/duplication testing only), GREM1 (promoter region deletion/duplication testing only), KIT, MEN1, MLH1, MSH2, MSH3, MSH6, MUTYH, NBN, NF1, NHTL1, PALB2, PDGFRA, PMS2, POLD1, POLE, PTEN, RAD50, RAD51C, RAD51D, SDHB, SDHC, SDHD, SMAD4, SMARCA4. STK11, TP53, TSC1, TSC2, and VHL.  The  following genes were evaluated for sequence changes only: SDHA and HOXB13 c.251G>A variant only.  The report date is May 26, 2017.   06/19/2017 Surgery   Left lumpectomy: IDC with DCIS, 2.1 cm, 0/9 lymph nodes negative margins negative; ER 95%, PR 95%, HER-2 negative ratio 1.32, Ki-67 20%, T2N0 stage Ib; Right lumpectomy: IDC with DCIS 1.8 cm, focally involving lateral and anterior margins, 0/3 lymph nodes negative, ER 100%, PR 20%, HER-2 negative ratio 1.23, Ki-67 3%, T1CN0 stage I a   06/19/2017 Oncotype testing   Oncotype DX score 17 and 14: Risk of recurrence 11%/9% with tamoxifen alone   07/07/2017 Surgery   Reexcision lateral margin: Residual DCIS intermediate grade, reexcision anterior margin: [facility]   08/13/2017 - 09/29/2017 Radiation Therapy   Adjuvant radiation therapy   10/2017 -  Anti-estrogen oral therapy   Tamoxifen daily   01/20/2018 Cancer Staging   Staging form: Breast, AJCC 8th Edition - Pathologic: Stage IA (pT2, pN0, cM0, G1, ER+, PR+, HER2-) - Signed by Loa Socks, NP on 01/20/2018   Malignant neoplasm of lower-outer quadrant of right breast of female, estrogen receptor positive (HCC) (Resolved)  07/24/2017 Initial Diagnosis   Malignant neoplasm of lower-outer quadrant of right breast of female, estrogen receptor positive (HCC)   01/20/2018 Cancer Staging   Staging form: Breast, AJCC 8th Edition - Pathologic: Stage IA (pT1c, pN0, cM0, G1, ER+, PR+, HER2-) - Signed by Loa Socks, NP on 01/20/2018     CHIEF COMPLIANT: Surveillance of breast cancer on anastrozole therapy  HISTORY OF PRESENT ILLNESS: Margaret Rojas is a 48 year old female who presents for follow-up regarding her medication regimen and menopause symptoms.  She underwent ovary surgery a year ago and has been experiencing menopause symptoms since  then. She is currently taking Anastrozole without major issues, although she experiences hot flashes. She has been managing these  with Veozah for the past six months, which reduces but does not eliminate the hot flashes.  She has a history of taking tamoxifen for four years following her surgery in 2018, starting in 2019. She is currently on a regimen that includes five years of an aromatase inhibitor (AI) as long as her bone health remains stable and she tolerates the medication well.  She mentions a recent weight gain of about ten pounds over the past month, which she attributes to a change in her medication from Ambien to amitriptyline. She has been engaging in strength training and working out more, but notes that muscle does not account for the weight gain. She is considering the use of Ozempic for weight management, as she has previously lost weight successfully but has since regained it. She expresses concern about the impact of her current medications on her weight.  She is aware of the need for liver function tests due to her use of Veozah, although she has not yet completed this blood work. She is also informed about a new blood test that can detect cancer recurrence early, which she is interested in pursuing.    ALLERGIES:  is allergic to pneumovax [pneumococcal polysaccharide vaccine], sulfonamide derivatives, and epinephrine.  MEDICATIONS:  Current Outpatient Medications  Medication Sig Dispense Refill   amitriptyline (ELAVIL) 50 MG tablet Take 1-2 tablets (50-100 mg total) by mouth at bedtime. 14 tablet 0   anastrozole (ARIMIDEX) 1 MG tablet Take 1 tablet (1 mg total) by mouth daily. 90 tablet 3   clonazePAM (KLONOPIN) 0.25 MG disintegrating tablet Take 1 tablet (0.25 mg total) by mouth as needed (anxiety). 20 tablet 0   cyclobenzaprine (FLEXERIL) 10 MG tablet Take 10 mg by mouth 2 (two) times daily as needed (migraines).     DULoxetine (CYMBALTA) 30 MG capsule Take 1 capsule (30 mg total) by mouth See admin instructions. Take with 60 mg for a total of 90 mg in the morning 90 capsule 0   DULoxetine (CYMBALTA)  60 MG capsule Take 1 capsule (60 mg total) by mouth daily. Take along with 30 mg daily 90 capsule 0   ELDERBERRY PO Take 3 capsules by mouth at bedtime.     gabapentin (NEURONTIN) 300 MG capsule Take 1 capsule (300 mg total) by mouth 2 (two) times daily.     ibuprofen (ADVIL) 200 MG tablet Take 600 mg by mouth every 6 (six) hours as needed for headache.     NUCYNTA 50 MG tablet Take 50 mg by mouth at bedtime as needed.     topiramate (TOPAMAX) 100 MG tablet Take 100 mg by mouth at bedtime.     traMADol (ULTRAM) 50 MG tablet Take 1 tablet (50 mg total) by mouth every 6 (six) hours as needed for moderate pain or severe pain. 30 tablet 0   VEOZAH 45 MG TABS Take 1 tablet by mouth daily.     zolmitriptan (ZOMIG) 5 MG tablet Take 5 mg by mouth as needed for migraine.     No current facility-administered medications for this visit.    PHYSICAL EXAMINATION: ECOG PERFORMANCE STATUS: 1 - Symptomatic but completely ambulatory  Vitals:   11/11/23 1442  BP: 110/76  Pulse: 88  Resp: 17  Temp: 98.5 F (36.9 C)  SpO2: 100%   Filed Weights   11/11/23 1442  Weight: 201 lb 12.8 oz (91.5 kg)  Physical Exam No palpable lumps or nodules in bilateral breasts or axilla  (exam performed in the presence of a chaperone)  LABORATORY DATA:  I have reviewed the data as listed    Latest Ref Rng & Units 11/11/2023    3:30 PM 10/21/2022    4:22 AM 10/20/2022    9:12 AM  CMP  Glucose 70 - 99 mg/dL 93  161  87   BUN 6 - 20 mg/dL 28  11  21    Creatinine 0.44 - 1.00 mg/dL 0.96  0.45  4.09   Sodium 135 - 145 mmol/L 140  136  135   Potassium 3.5 - 5.1 mmol/L 3.9  3.8  4.2   Chloride 98 - 111 mmol/L 105  108  108   CO2 22 - 32 mmol/L 27  20  19    Calcium 8.9 - 10.3 mg/dL 9.1  8.0  8.4   Total Protein 6.5 - 8.1 g/dL 7.4     Total Bilirubin 0.0 - 1.2 mg/dL 0.3     Alkaline Phos 38 - 126 U/L 92     AST 15 - 41 U/L 41     ALT 0 - 44 U/L 41       Lab Results  Component Value Date   WBC 10.8 (H)  10/21/2022   HGB 11.0 (L) 10/21/2022   HCT 32.3 (L) 10/21/2022   MCV 92.0 10/21/2022   PLT 216 10/21/2022   NEUTROABS 2.4 11/19/2020    ASSESSMENT & PLAN:  Malignant neoplasm of upper-outer quadrant of left breast in female, estrogen receptor positive (HCC) 06/19/2017: Left lumpectomy: IDC with DCIS, 2.1 cm, 0/9 lymph nodes negative margins negative; ER 95%, PR 95%, HER-2 negative ratio 1.32, Ki-67 20%, T2N0 stage Ib;  Right lumpectomy: IDC with DCIS 1.8 cm, focally involving lateral and anterior margins, 0/3 lymph nodes negative, reexcision anterior and lateral margins DCIS, ER 100%, PR 20%, HER-2 negative ratio 1.23, Ki-67 3%, T1CN0 stage I a   Oncotype DX recurrence score 17: Risk of recurrence 11% with tamoxifen alone There was not enough material to send Oncotype on the right breast. Adjuvant radiation therapy 08/13/2017-09/29/2017   Current treatment: Tamoxifen 20 mg daily originally started February 2019 off for 6 weeks restarted April 2020 at 10 mg, hysterectomy with BSO February 2024 (for heavy bleeding), anastrozole to start 12/15/2022   Anastrozole Toxicities: Tolerating it well  Breast cancer surveillance: mammogram 01/09/2023: Recommended stereotactic biopsy of architectural distortion Biopsy 01/15/2023: Hyaline fibrosis, negative for cancer Breast MRI 06/02/2023: Benign breast density category B Breast exam 11/11/2023: Benign   Considering Ozympic for weight loss   Return to clinic in 1 year for follow-up     Orders Placed This Encounter  Procedures   CMP (Cancer Center only)    Standing Status:   Future    Number of Occurrences:   1    Expiration Date:   11/10/2024   The patient has a good understanding of the overall plan. she agrees with it. she will call with any problems that may develop before the next visit here. Total time spent: 30 mins including face to face time and time spent for planning, charting and co-ordination of care   Tamsen Meek,  MD 11/12/23

## 2023-11-11 NOTE — Assessment & Plan Note (Signed)
 06/19/2017: Left lumpectomy: IDC with DCIS, 2.1 cm, 0/9 lymph nodes negative margins negative; ER 95%, PR 95%, HER-2 negative ratio 1.32, Ki-67 20%, T2N0 stage Ib;  Right lumpectomy: IDC with DCIS 1.8 cm, focally involving lateral and anterior margins, 0/3 lymph nodes negative, reexcision anterior and lateral margins DCIS, ER 100%, PR 20%, HER-2 negative ratio 1.23, Ki-67 3%, T1CN0 stage I a   Oncotype DX recurrence score 17: Risk of recurrence 11% with tamoxifen alone There was not enough material to send Oncotype on the right breast. Adjuvant radiation therapy 08/13/2017-09/29/2017   Current treatment: Tamoxifen 20 mg daily originally started February 2019 off for 6 weeks restarted April 2020 at 10 mg, hysterectomy with BSO February 2024 (for heavy bleeding), anastrozole to start 12/15/2018   Anastrozole Toxicities:  Breast cancer surveillance: mammogram 01/09/2023: Recommended stereotactic biopsy of architectural distortion Biopsy 01/15/2023: Hyaline fibrosis, negative for cancer Breast MRI 06/02/2023: Benign breast density category B Breast exam 11/11/2023: Benign   Patient is on a weight loss program through [facility] and had lost 25 pounds.   Return to clinic in 1 year for follow-up

## 2023-11-13 ENCOUNTER — Telehealth: Payer: Self-pay

## 2023-11-13 NOTE — Telephone Encounter (Signed)
 Per md orders entered for [employer] and all supported documents faxed to [phone]. Faxed confirmation was received.

## 2023-11-17 ENCOUNTER — Telehealth (HOSPITAL_COMMUNITY): Payer: Self-pay | Admitting: Psychiatry

## 2023-11-18 ENCOUNTER — Encounter: Payer: 59 | Admitting: Family Medicine

## 2023-11-18 ENCOUNTER — Encounter: Payer: Self-pay | Admitting: Hematology and Oncology

## 2023-11-19 ENCOUNTER — Encounter (HOSPITAL_COMMUNITY): Payer: Self-pay

## 2023-11-20 ENCOUNTER — Telehealth (HOSPITAL_BASED_OUTPATIENT_CLINIC_OR_DEPARTMENT_OTHER): Payer: Self-pay | Admitting: Psychiatry

## 2023-11-20 ENCOUNTER — Encounter (HOSPITAL_COMMUNITY): Payer: Self-pay | Admitting: Psychiatry

## 2023-11-20 VITALS — Wt 201.0 lb

## 2023-11-20 DIAGNOSIS — F419 Anxiety disorder, unspecified: Secondary | ICD-10-CM | POA: Diagnosis not present

## 2023-11-20 DIAGNOSIS — F330 Major depressive disorder, recurrent, mild: Secondary | ICD-10-CM

## 2023-11-20 DIAGNOSIS — F5101 Primary insomnia: Secondary | ICD-10-CM

## 2023-11-20 MED ORDER — TEMAZEPAM 15 MG PO CAPS
15.0000 mg | ORAL_CAPSULE | Freq: Every evening | ORAL | 0 refills | Status: DC | PRN
Start: 2023-11-20 — End: 2023-12-18

## 2023-11-20 NOTE — Progress Notes (Signed)
 Margaret Rojas Virtual Progress Note   Patient Location: Home Provider Location: Home Office  I connect with patient by video and verified that I am speaking with correct person by using two identifiers. I discussed the limitations of evaluation and management by telemedicine and the availability of in person appointments. I also discussed with the patient that there may be a patient responsible charge related to this service. The patient expressed understanding and agreed to proceed.  Margaret Rojas [id number] 48 y.o.  11/20/2023 8:42 AM  History of Present Illness:  Patient is evaluated by video session.  She requested an earlier appointment because she is having a lot of frustration and irritability because she could not sleep and she had pain more than 10 pounds in past few weeks.  She is taking amitriptyline to help her sleep, anxiety and recently dose was increased because she could not sleep very well.  She noticed since taking the amitriptyline she had gained weight.  She is spending up to 3 hours in the gym and doing cardio but has not had any success losing weight.  She admitted sometime she gets so frustrated after checking her weight eventually ended up eating too much calories.  She feels tired, exhausted in the morning and sometimes not able to function.  She feels very anxious and depressed because of her weight.  She denies any crying spells or any suicidal thoughts.  She feels the Cymbalta is working and keeping her mood stable.  She has no tremors, shakes or any EPS.  Recently she had a labs and her BUN slightly high.  She was told by her doctor to hydrate herself.  Patient does drink at least 64 ounces bottle of water every day and doing a lot of exercise.  No tremors or shakes.  No aggression or violence.  Patient has sleep study but did not have diagnosis of apnea.  She is taking sometimes one fourth of Klonopin, muscle relaxant and melatonin combination to go  to sleep.  She is taking amitriptyline 50 mg.  Past Psychiatric History: H/O depression since 2005.  Took Lexapro while pregnant, Zoloft until switched to Effexor in November 2018 when diagnosed with breast cancer.  We switched to Cymbalta for better control on her chronic pain.  Tried Xanax and Lunesta (d/c metallic taste). No history of suicidal attempt or inpatient psychiatric treatment.    Outpatient Encounter Medications as of 11/20/2023  Medication Sig   amitriptyline (ELAVIL) 50 MG tablet Take 1-2 tablets (50-100 mg total) by mouth at bedtime.   anastrozole (ARIMIDEX) 1 MG tablet Take 1 tablet (1 mg total) by mouth daily.   clonazePAM (KLONOPIN) 0.25 MG disintegrating tablet Take 1 tablet (0.25 mg total) by mouth as needed (anxiety).   cyclobenzaprine (FLEXERIL) 10 MG tablet Take 10 mg by mouth 2 (two) times daily as needed (migraines).   DULoxetine (CYMBALTA) 30 MG capsule Take 1 capsule (30 mg total) by mouth See admin instructions. Take with 60 mg for a total of 90 mg in the morning   DULoxetine (CYMBALTA) 60 MG capsule Take 1 capsule (60 mg total) by mouth daily. Take along with 30 mg daily   ELDERBERRY PO Take 3 capsules by mouth at bedtime.   gabapentin (NEURONTIN) 300 MG capsule Take 1 capsule (300 mg total) by mouth 2 (two) times daily.   ibuprofen (ADVIL) 200 MG tablet Take 600 mg by mouth every 6 (six) hours as needed for headache.   NUCYNTA 50 MG tablet  Take 50 mg by mouth at bedtime as needed.   topiramate (TOPAMAX) 100 MG tablet Take 100 mg by mouth at bedtime.   traMADol (ULTRAM) 50 MG tablet Take 1 tablet (50 mg total) by mouth every 6 (six) hours as needed for moderate pain or severe pain.   VEOZAH 45 MG TABS Take 1 tablet by mouth daily.   zolmitriptan (ZOMIG) 5 MG tablet Take 5 mg by mouth as needed for migraine.   No facility-administered encounter medications on file as of 11/20/2023.    Recent Results (from the past 2160 hours)  CMP (Cancer Center only)     Status:  Abnormal   Collection Time: 11/11/23  3:30 PM  Result Value Ref Range   Sodium 140 135 - 145 mmol/L   Potassium 3.9 3.5 - 5.1 mmol/L   Chloride 105 98 - 111 mmol/L   CO2 27 22 - 32 mmol/L   Glucose, Bld 93 70 - 99 mg/dL    Comment: Glucose reference range applies only to samples taken after fasting for at least 8 hours.   BUN 28 (H) 6 - 20 mg/dL   Creatinine 1.61 0.96 - 1.00 mg/dL   Calcium 9.1 8.9 - 04.5 mg/dL   Total Protein 7.4 6.5 - 8.1 g/dL   Albumin 4.3 3.5 - 5.0 g/dL   AST 41 15 - 41 U/L   ALT 41 0 - 44 U/L   Alkaline Phosphatase 92 38 - 126 U/L   Total Bilirubin 0.3 0.0 - 1.2 mg/dL   GFR, Estimated >40 >98 mL/min    Comment: (NOTE) Calculated using the CKD-EPI Creatinine Equation (2021)    Anion gap 8 5 - 15    Comment: Performed at [facility], [address], [place], [place] [address]     Psychiatric Specialty Exam: Physical Exam  Review of Systems  Constitutional:        Excessive weight gain  Psychiatric/Behavioral:  Positive for dysphoric mood and sleep disturbance.     Weight 201 lb (91.2 kg).There is no height or weight on file to calculate BMI.  General Appearance: Well Groomed  Eye Contact:  Good  Speech:  Normal Rate  Volume:  Normal  Mood:  Dysphoric  Affect:  Congruent  Thought Process:  Goal Directed  Orientation:  Full (Time, Place, and Person)  Thought Content:  Logical  Suicidal Thoughts:  No  Homicidal Thoughts:  No  Memory:  Immediate;   Good Recent;   Good Remote;   Good  Judgement:  Intact  Insight:  Present  Psychomotor Activity:  Normal  Concentration:  Concentration: Good and Attention Span: Good  Recall:  Good  Fund of Knowledge:  Good  Language:  Good  Akathisia:  No  Handed:  Right  AIMS (if indicated):     Assets:  Communication Skills Desire for Improvement Housing Resilience Social Support Talents/Skills Transportation  ADL's:  Intact  Cognition:  WNL  Sleep:  poor      Assessment/Plan: Primary insomnia - Plan: temazepam (RESTORIL) 15 MG capsule  Anxiety  MDD (major depressive disorder), recurrent episode, mild (HCC)  Discussed chronic insomnia.  Reviewed blood work results.  BUN slightly increased encourage hydration.  Recommended discontinuing amitriptyline because it is not helping and she gained weight.  I recommend to try temazepam 15 mg at bedtime and discontinue Klonopin.  She takes Klonopin before she go to sleep.  Recommend not to take any muscle relaxant since hopefully temazepam 15 mg can help her  sleep.  We discussed benzodiazepine dependence tolerance withdrawal.  For now she will continue Cymbalta 90 mg daily.  I also discussed about Topamax which helps weight loss.  Patient is also considering Ozempic and I discussed she need to try stopping amitriptyline to see if weight gain resolved.  I also discussed possible side effects of Ozempic and she need to closely monitor her symptoms which is usually nausea and abdominal pain.  Patient like to keep appointment in 4 weeks.  She has enough Cymbalta until her next appointment.   Follow Up Instructions:     I discussed the assessment and treatment plan with the patient. The patient was provided an opportunity to ask questions and all were answered. The patient agreed with the plan and demonstrated an understanding of the instructions.   The patient was advised to call back or seek an in-person evaluation if the symptoms worsen or if the condition fails to improve as anticipated.    Collaboration of Care: Other provider involved in patient's care AEB notes are available in epic to review  Patient/Guardian was advised Release of Information must be obtained prior to any record release in order to collaborate their care with an outside provider. Patient/Guardian was advised if they have not already done so to contact the registration department to sign all necessary forms in order for [place] to release  information regarding their care.   Consent: Patient/Guardian gives verbal consent for treatment and assignment of benefits for services provided during this visit. Patient/Guardian expressed understanding and agreed to proceed.     I provided 26 minutes of non face to face time during this encounter.  Note: This document was prepared by [employer] voice dictation technology and any errors that results from this process are unintentional.    Cleotis Nipper, Rojas 11/20/2023

## 2023-11-25 ENCOUNTER — Ambulatory Visit (INDEPENDENT_AMBULATORY_CARE_PROVIDER_SITE_OTHER): Payer: 59 | Admitting: Family Medicine

## 2023-11-25 ENCOUNTER — Encounter: Payer: Self-pay | Admitting: Family Medicine

## 2023-11-25 VITALS — BP 110/80 | HR 84 | Temp 97.9°F | Ht 67.0 in | Wt 203.4 lb

## 2023-11-25 DIAGNOSIS — Z23 Encounter for immunization: Secondary | ICD-10-CM

## 2023-11-25 DIAGNOSIS — Z13220 Encounter for screening for lipoid disorders: Secondary | ICD-10-CM | POA: Diagnosis not present

## 2023-11-25 DIAGNOSIS — Z0000 Encounter for general adult medical examination without abnormal findings: Secondary | ICD-10-CM | POA: Diagnosis not present

## 2023-11-25 DIAGNOSIS — N809 Endometriosis, unspecified: Secondary | ICD-10-CM

## 2023-11-25 DIAGNOSIS — F17200 Nicotine dependence, unspecified, uncomplicated: Secondary | ICD-10-CM

## 2023-11-25 DIAGNOSIS — E785 Hyperlipidemia, unspecified: Secondary | ICD-10-CM

## 2023-11-25 DIAGNOSIS — F4323 Adjustment disorder with mixed anxiety and depressed mood: Secondary | ICD-10-CM

## 2023-11-25 DIAGNOSIS — Z1159 Encounter for screening for other viral diseases: Secondary | ICD-10-CM | POA: Insufficient documentation

## 2023-11-25 DIAGNOSIS — E669 Obesity, unspecified: Secondary | ICD-10-CM | POA: Insufficient documentation

## 2023-11-25 NOTE — Assessment & Plan Note (Signed)
 Pt had a hysterectomy
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
## 2023-11-25 NOTE — Assessment & Plan Note (Signed)
 Discussed how this problem influences overall health and the risks it imposes  Reviewed plan for weight loss with lower calorie diet (via better food choices (lower glycemic and portion control) along with exercise building up to or more than 30 minutes 5 days per week including some aerobic activity and strength training   Pt is on arimidex making it hard to loose weight  Also s/p hysterectomy   Did stop elavil recently which may help

## 2023-11-25 NOTE — Assessment & Plan Note (Signed)
 Continues psychiatric care  Cymbalta, temazepam, -recently stopped amitriptyline Good self care PHQ is 9 Frustrated with weight gain

## 2023-11-25 NOTE — Assessment & Plan Note (Signed)
Hep C screen today Low risk 

## 2023-11-25 NOTE — Patient Instructions (Addendum)
 Give a little more time to resistance training   Try to get most of your carbohydrates from produce (with the exception of white potatoes) and whole grains Eat less bread/pasta/rice/snack foods/cereals/sweets and other items from the middle of the grocery store (processed carbs)  Tetanus shot today   Labs today     Ask your ins co if they pay for any GLP-1 medications (injectable) for weight loss

## 2023-11-25 NOTE — Assessment & Plan Note (Signed)
 Lipid panel today   Disc goals for lipids and reasons to control them Rev last labs with pt Rev low sat fat diet in detail

## 2023-11-25 NOTE — Assessment & Plan Note (Signed)
 Reviewed health habits including diet and exercise and skin cancer prevention Reviewed appropriate screening tests for age  Also reviewed health mt list, fam hx and immunization status , as well as social and family history   See HPI Labs reviewed and ordered Health Maintenance  Topic Date Due   Hepatitis C Screening  Never done   Pneumococcal Vaccination (2 of 2 - PCV) 06/23/2014   Colon Cancer Screening  Never done   Flu Shot  12/14/2023*   COVID-19 Vaccine (3 - Pfizer risk series) 12/10/2024*   Mammogram  06/01/2024   DTaP/Tdap/Td vaccine (4 - Td or Tdap) 11/24/2033   HIV Screening  Completed   HPV Vaccine  Aged Out  *Topic was postponed. The date shown is not the original due date.   Td updated today  Hep C screen with lab today  Counseled on smoking cessation  Sent for colonoscopy report from age 20 (? Dr L[address]ta Ave) Discussed fall prevention, supplements and exercise for bone density   Normal dexa 05/2023 (on arimidex)  Encouraged more strength training  Encouraged regular derm care and sun protection  PHQ 9 -under care of psychiatry

## 2023-11-25 NOTE — Assessment & Plan Note (Signed)
 Disc in detail risks of smoking and possible outcomes including copd, vascular/ heart disease, cancer , respiratory and sinus infections as well as osteoporosis  Pt voices understanding Not ready to quit unless husband does it with her

## 2023-11-26 ENCOUNTER — Encounter: Payer: Self-pay | Admitting: Family Medicine

## 2023-11-26 DIAGNOSIS — R799 Abnormal finding of blood chemistry, unspecified: Secondary | ICD-10-CM

## 2023-11-26 DIAGNOSIS — E785 Hyperlipidemia, unspecified: Secondary | ICD-10-CM

## 2023-11-26 LAB — CBC WITH DIFFERENTIAL/PLATELET
Basophils Absolute: 0 10*3/uL (ref 0.0–0.1)
Basophils Relative: 0.6 % (ref 0.0–3.0)
Eosinophils Absolute: 0.2 10*3/uL (ref 0.0–0.7)
Eosinophils Relative: 2.7 % (ref 0.0–5.0)
HCT: 43.1 % (ref 36.0–46.0)
Hemoglobin: 14.2 g/dL (ref 12.0–15.0)
Lymphocytes Relative: 34.9 % (ref 12.0–46.0)
Lymphs Abs: 2.3 10*3/uL (ref 0.7–4.0)
MCHC: 33 g/dL (ref 30.0–36.0)
MCV: 93.7 fl (ref 78.0–100.0)
Monocytes Absolute: 0.6 10*3/uL (ref 0.1–1.0)
Monocytes Relative: 9.4 % (ref 3.0–12.0)
Neutro Abs: 3.5 10*3/uL (ref 1.4–7.7)
Neutrophils Relative %: 52.4 % (ref 43.0–77.0)
Platelets: 269 10*3/uL (ref 150.0–400.0)
RBC: 4.61 Mil/uL (ref 3.87–5.11)
RDW: 13.6 % (ref 11.5–15.5)
WBC: 6.7 10*3/uL (ref 4.0–10.5)

## 2023-11-26 LAB — COMPREHENSIVE METABOLIC PANEL
ALT: 15 U/L (ref 0–35)
AST: 15 U/L (ref 0–37)
Albumin: 4.4 g/dL (ref 3.5–5.2)
Alkaline Phosphatase: 90 U/L (ref 39–117)
BUN: 27 mg/dL — ABNORMAL HIGH (ref 6–23)
CO2: 28 meq/L (ref 19–32)
Calcium: 9.4 mg/dL (ref 8.4–10.5)
Chloride: 103 meq/L (ref 96–112)
Creatinine, Ser: 0.93 mg/dL (ref 0.40–1.20)
GFR: 73.06 mL/min (ref >60.00)
Glucose, Bld: 97 mg/dL (ref 70–99)
Potassium: 4.5 meq/L (ref 3.5–5.1)
Sodium: 140 meq/L (ref 135–145)
Total Bilirubin: 0.2 mg/dL (ref 0.2–1.2)
Total Protein: 7.1 g/dL (ref 6.0–8.3)

## 2023-11-26 LAB — TSH: TSH: 0.78 u[IU]/mL (ref 0.35–5.50)

## 2023-11-26 LAB — LIPID PANEL
Cholesterol: 260 mg/dL — ABNORMAL HIGH (ref 0–200)
HDL: 76.3 mg/dL (ref >39.00)
LDL Cholesterol: 164 mg/dL — ABNORMAL HIGH (ref 0–99)
NonHDL: 184.15
Total CHOL/HDL Ratio: 3
Triglycerides: 99 mg/dL (ref 0.0–149.0)
VLDL: 19.8 mg/dL (ref 0.0–40.0)

## 2023-11-26 LAB — HEPATITIS C ANTIBODY: Hepatitis C Ab: NONREACTIVE

## 2023-11-30 MED ORDER — ROSUVASTATIN CALCIUM 10 MG PO TABS: 10.0000 mg | ORAL_TABLET | Freq: Every day | ORAL | 3 refills | Status: AC

## 2023-12-01 DIAGNOSIS — R799 Abnormal finding of blood chemistry, unspecified: Secondary | ICD-10-CM | POA: Insufficient documentation

## 2023-12-01 NOTE — Addendum Note (Signed)
 Addended by: Roxy Manns A on: 12/01/2023 08:11 PM   Modules accepted: Orders

## 2023-12-10 ENCOUNTER — Telehealth: Payer: Self-pay | Admitting: *Deleted

## 2023-12-10 NOTE — Telephone Encounter (Signed)
 Per MD request RN placed call to pt with recent [employer] results being negative.  Pt educated and verbalized understanding.

## 2023-12-11 ENCOUNTER — Other Ambulatory Visit (HOSPITAL_COMMUNITY): Payer: Self-pay | Admitting: Psychiatry

## 2023-12-11 DIAGNOSIS — F330 Major depressive disorder, recurrent, mild: Secondary | ICD-10-CM

## 2023-12-11 DIAGNOSIS — F419 Anxiety disorder, unspecified: Secondary | ICD-10-CM

## 2023-12-15 ENCOUNTER — Encounter: Payer: Self-pay | Admitting: Hematology and Oncology

## 2023-12-18 ENCOUNTER — Telehealth (HOSPITAL_BASED_OUTPATIENT_CLINIC_OR_DEPARTMENT_OTHER): Payer: Self-pay | Admitting: Psychiatry

## 2023-12-18 ENCOUNTER — Encounter (HOSPITAL_COMMUNITY): Payer: Self-pay | Admitting: Psychiatry

## 2023-12-18 DIAGNOSIS — F419 Anxiety disorder, unspecified: Secondary | ICD-10-CM

## 2023-12-18 DIAGNOSIS — F330 Major depressive disorder, recurrent, mild: Secondary | ICD-10-CM | POA: Diagnosis not present

## 2023-12-18 DIAGNOSIS — F5101 Primary insomnia: Secondary | ICD-10-CM

## 2023-12-18 MED ORDER — TEMAZEPAM 15 MG PO CAPS: 15.0000 mg | ORAL_CAPSULE | Freq: Every evening | ORAL | 2 refills | Status: DC | PRN

## 2023-12-18 MED ORDER — DULOXETINE HCL 60 MG PO CPEP: 60.0000 mg | ORAL_CAPSULE | Freq: Every day | ORAL | 0 refills | Status: DC

## 2023-12-18 MED ORDER — CLONAZEPAM 0.25 MG PO TBDP
0.2500 mg | ORAL_TABLET | ORAL | 0 refills | Status: DC | PRN
Start: 2023-12-18 — End: 2024-02-15

## 2023-12-18 MED ORDER — DULOXETINE HCL 30 MG PO CPEP: 30.0000 mg | ORAL_CAPSULE | ORAL | 0 refills | Status: DC

## 2023-12-18 NOTE — Progress Notes (Signed)
 [facility] MD Virtual Progress Note   Patient Location: Home Provider Location: Home Office  I connect with patient by video and verified that I am speaking with correct person by using two identifiers. I discussed the limitations of evaluation and management by telemedicine and the availability of in person appointments. I also discussed with the patient that there may be a patient responsible charge related to this service. The patient expressed understanding and agreed to proceed.  Margaret Rojas [id number] 48 y.o.  12/18/2023 8:38 AM  History of Present Illness:  Patient is evaluated by video session.  On the last visit we discontinued amitriptyline after complaining of weight gain and did not help sleep.  We started her on low-dose temazepam and she noticed her sleep is much better.  She also lost 6 pounds since she stopped the amitriptyline.  She continues to spend time in gym and doing workout.  She watch her weight regularly and increase her hydration.  Recently had a visit with primary care and labs shows BUN borderline and cholesterol is high.  She is now on Crestor.  She denies any major panic attack but like to have a low-dose Klonopin in case she needed.  She has no tremors, shakes or any EPS.  Her daughter who is junior in high school is going to [place] through school on spring break.  She has some anxiety but also excited about her.  She denies any tremors shakes or any EPS.  Her chronic nerve pain is also much better since she noticed Ambien discontinued.  She had cut down her gabapentin which she is taking for chronic pain and prescribed by primary care.  She is taking Cymbalta which is keeping her depression is stable.  Past Psychiatric History: H/O depression since 2005.  Took Lexapro in pregnancy, took Zoloft until switched to Effexor after diagnosed breast cancer in 2018. We switched to Cymbalta for better control of pain.  Tried Xanax and Lunesta (  metallic taste). No history of suicidal attempt or inpatient psychiatric treatment.    Outpatient Encounter Medications as of 12/18/2023  Medication Sig   anastrozole (ARIMIDEX) 1 MG tablet Take 1 tablet (1 mg total) by mouth daily.   clonazePAM (KLONOPIN) 0.25 MG disintegrating tablet Take 1 tablet (0.25 mg total) by mouth as needed (anxiety).   cyclobenzaprine (FLEXERIL) 10 MG tablet Take 10 mg by mouth 2 (two) times daily as needed (migraines).   DULoxetine (CYMBALTA) 30 MG capsule Take 1 capsule (30 mg total) by mouth See admin instructions. Take with 60 mg for a total of 90 mg in the morning   DULoxetine (CYMBALTA) 60 MG capsule Take 1 capsule (60 mg total) by mouth daily. Take along with 30 mg daily   ELDERBERRY PO Take 3 capsules by mouth at bedtime.   gabapentin (NEURONTIN) 300 MG capsule Take 1 capsule (300 mg total) by mouth 2 (two) times daily.   ibuprofen (ADVIL) 200 MG tablet Take 600 mg by mouth every 6 (six) hours as needed for headache.   NUCYNTA 50 MG tablet Take 50 mg by mouth at bedtime as needed.   rosuvastatin (CRESTOR) 10 MG tablet Take 1 tablet (10 mg total) by mouth daily.   temazepam (RESTORIL) 15 MG capsule Take 1 capsule (15 mg total) by mouth at bedtime as needed for sleep.   topiramate (TOPAMAX) 100 MG tablet Take 100 mg by mouth at bedtime.   traMADol (ULTRAM) 50 MG tablet Take 1 tablet (50 mg total) by  mouth every 6 (six) hours as needed for moderate pain or severe pain.   VEOZAH 45 MG TABS Take 1 tablet by mouth daily.   zolmitriptan (ZOMIG) 5 MG tablet Take 5 mg by mouth as needed for migraine.   No facility-administered encounter medications on file as of 12/18/2023.    Recent Results (from the past 2160 hours)  CMP (Cancer Center only)     Status: Abnormal   Collection Time: 11/11/23  3:30 PM  Result Value Ref Range   Sodium 140 135 - 145 mmol/L   Potassium 3.9 3.5 - 5.1 mmol/L   Chloride 105 98 - 111 mmol/L   CO2 27 22 - 32 mmol/L   Glucose, Bld 93 70 -  99 mg/dL    Comment: Glucose reference range applies only to samples taken after fasting for at least 8 hours.   BUN 28 (H) 6 - 20 mg/dL   Creatinine 1.61 0.96 - 1.00 mg/dL   Calcium 9.1 8.9 - 04.5 mg/dL   Total Protein 7.4 6.5 - 8.1 g/dL   Albumin 4.3 3.5 - 5.0 g/dL   AST 41 15 - 41 U/L   ALT 41 0 - 44 U/L   Alkaline Phosphatase 92 38 - 126 U/L   Total Bilirubin 0.3 0.0 - 1.2 mg/dL   GFR, Estimated >40 >98 mL/min    Comment: (NOTE) Calculated using the CKD-EPI Creatinine Equation (2021)    Anion gap 8 5 - 15    Comment: Performed at [facility], [address], [place], [place] [address]  CBC with Differential/Platelet     Status: None   Collection Time: 11/25/23  3:20 PM  Result Value Ref Range   WBC 6.7 4.0 - 10.5 K/uL   RBC 4.61 3.87 - 5.11 Mil/uL   Hemoglobin 14.2 12.0 - 15.0 g/dL   HCT 78.2 95.6 - 21.3 %   MCV 93.7 78.0 - 100.0 fl   MCHC 33.0 30.0 - 36.0 g/dL   RDW 08.6 57.8 - 46.9 %   Platelets 269.0 150.0 - 400.0 K/uL   Neutrophils Relative % 52.4 43.0 - 77.0 %   Lymphocytes Relative 34.9 12.0 - 46.0 %   Monocytes Relative 9.4 3.0 - 12.0 %   Eosinophils Relative 2.7 0.0 - 5.0 %   Basophils Relative 0.6 0.0 - 3.0 %   Neutro Abs 3.5 1.4 - 7.7 K/uL   Lymphs Abs 2.3 0.7 - 4.0 K/uL   Monocytes Absolute 0.6 0.1 - 1.0 K/uL   Eosinophils Absolute 0.2 0.0 - 0.7 K/uL   Basophils Absolute 0.0 0.0 - 0.1 K/uL  Comprehensive metabolic panel     Status: Abnormal   Collection Time: 11/25/23  3:20 PM  Result Value Ref Range   Sodium 140 135 - 145 mEq/L   Potassium 4.5 3.5 - 5.1 mEq/L   Chloride 103 96 - 112 mEq/L   CO2 28 19 - 32 mEq/L   Glucose, Bld 97 70 - 99 mg/dL   BUN 27 (H) 6 - 23 mg/dL   Creatinine, Ser 6.29 0.40 - 1.20 mg/dL   Total Bilirubin 0.2 0.2 - 1.2 mg/dL   Alkaline Phosphatase 90 39 - 117 U/L   AST 15 0 - 37 U/L   ALT 15 0 - 35 U/L   Total Protein 7.1 6.0 - 8.3 g/dL   Albumin 4.4 3.5 - 5.2 g/dL   GFR 52.84 >13.24 mL/min     Comment: Calculated using the CKD-EPI Creatinine Equation (2021)  Calcium 9.4 8.4 - 10.5 mg/dL  Lipid Panel     Status: Abnormal   Collection Time: 11/25/23  3:20 PM  Result Value Ref Range   Cholesterol 260 (H) 0 - 200 mg/dL    Comment: ATP III Classification       Desirable:  < 200 mg/dL               Borderline High:  200 - 239 mg/dL          High:  > = 782 mg/dL   Triglycerides 95.6 0.0 - 149.0 mg/dL    Comment: Normal:  <213 mg/dLBorderline High:  150 - 199 mg/dL   HDL 08.65 >78.46 mg/dL   VLDL 96.2 0.0 - 95.2 mg/dL   LDL Cholesterol 841 (H) 0 - 99 mg/dL   Total CHOL/HDL Ratio 3     Comment:                Men          Women1/2 Average Risk     3.4          3.3Average Risk          5.0          4.42X Average Risk          9.6          7.13X Average Risk          15.0          11.0                       NonHDL 184.15     Comment: NOTE:  Non-HDL goal should be 30 mg/dL higher than patient's LDL goal (i.e. LDL goal of < 70 mg/dL, would have non-HDL goal of < 100 mg/dL)  TSH     Status: None   Collection Time: 11/25/23  3:20 PM  Result Value Ref Range   TSH 0.78 0.35 - 5.50 uIU/mL  Hepatitis C Antibody     Status: None   Collection Time: 11/25/23  3:20 PM  Result Value Ref Range   Hepatitis C Ab NON-REACTIVE NON-REACTIVE    Comment: . HCV antibody was non-reactive. There is no laboratory  evidence of HCV infection. . In most cases, no further action is required. However, if recent HCV exposure is suspected, a test for HCV RNA (test code 32440) is suggested. . For additional information please refer to http://education.questdiagnostics.com/faq/FAQ22v1 (This link is being provided for informational/ educational purposes only.) .      Psychiatric Specialty Exam: Physical Exam  Review of Systems  Weight 196 lb (88.9 kg).There is no height or weight on file to calculate BMI.  General Appearance: Casual  Eye Contact:  Good  Speech:  Clear and Coherent and Normal Rate   Volume:  Normal  Mood:  Euthymic  Affect:  Appropriate  Thought Process:  Goal Directed  Orientation:  Full (Time, Place, and Person)  Thought Content:  WDL  Suicidal Thoughts:  No  Homicidal Thoughts:  No  Memory:  Immediate;   Good Recent;   Good Remote;   Good  Judgement:  Good  Insight:  Present  Psychomotor Activity:  Normal  Concentration:  Concentration: Good and Attention Span: Good  Recall:  Good  Fund of Knowledge:  Good  Language:  Good  Akathisia:  No  Handed:  Right  AIMS (if indicated):     Assets:  Communication Skills Desire for Improvement Housing  Resilience Social Support Transportation  ADL's:  Intact  Cognition:  WNL  Sleep:  good     Assessment/Plan: Anxiety - Plan: DULoxetine (CYMBALTA) 60 MG capsule, DULoxetine (CYMBALTA) 30 MG capsule, clonazePAM (KLONOPIN) 0.25 MG disintegrating tablet  MDD (major depressive disorder), recurrent episode, mild (HCC) - Plan: DULoxetine (CYMBALTA) 30 MG capsule  Primary insomnia - Plan: temazepam (RESTORIL) 15 MG capsule  Patient doing better with temazepam 15 mg at bedtime.  I had recommended to discontinue Klonopin but patient like to have few pills in case she has a panic attack.  We have provide 0.25 mg and she usually take one fourth of the medication for severe panic attack.  Will provide 10 tablets which should last at least 3 months.  She does not want to change the medication since it is working well and we will keep the Cymbalta 90 mg daily.  She started Crestor to help her cholesterol.  Her primary care does not want her to be on Ozempic which she was hoping.  She had lost weight since stop the amitriptyline.  Will continue Cymbalta 90 mg daily, temazepam 15 mg at bedtime and Klonopin 0.25 mg 1/4 tablet as needed for severe panic attack.  Recommend to call us back if you have any question or any concern.  Follow-up in 3 months.   Follow Up Instructions:     I discussed the assessment and treatment plan  with the patient. The patient was provided an opportunity to ask questions and all were answered. The patient agreed with the plan and demonstrated an understanding of the instructions.   The patient was advised to call back or seek an in-person evaluation if the symptoms worsen or if the condition fails to improve as anticipated.    Collaboration of Care: Other provider involved in patient's care AEB notes are available in epic to review  Patient/Guardian was advised Release of Information must be obtained prior to any record release in order to collaborate their care with an outside provider. Patient/Guardian was advised if they have not already done so to contact the registration department to sign all necessary forms in order for [place] to release information regarding their care.   Consent: Patient/Guardian gives verbal consent for treatment and assignment of benefits for services provided during this visit. Patient/Guardian expressed understanding and agreed to proceed.     I provided 24 minutes of non face to face time during this encounter.  Note: This document was prepared by [employer] voice dictation technology and any errors that results from this process are unintentional.    Cleotis Nipper, MD 12/18/2023

## 2024-01-10 ENCOUNTER — Other Ambulatory Visit (HOSPITAL_COMMUNITY): Payer: Self-pay | Admitting: Psychiatry

## 2024-01-10 DIAGNOSIS — F330 Major depressive disorder, recurrent, mild: Secondary | ICD-10-CM

## 2024-01-10 DIAGNOSIS — F419 Anxiety disorder, unspecified: Secondary | ICD-10-CM

## 2024-02-14 ENCOUNTER — Encounter: Payer: Self-pay | Admitting: Hematology and Oncology

## 2024-02-15 ENCOUNTER — Encounter (HOSPITAL_COMMUNITY): Payer: Self-pay | Admitting: Psychiatry

## 2024-02-15 ENCOUNTER — Telehealth (HOSPITAL_BASED_OUTPATIENT_CLINIC_OR_DEPARTMENT_OTHER): Admitting: Psychiatry

## 2024-02-15 DIAGNOSIS — F330 Major depressive disorder, recurrent, mild: Secondary | ICD-10-CM | POA: Diagnosis not present

## 2024-02-15 DIAGNOSIS — F5101 Primary insomnia: Secondary | ICD-10-CM | POA: Diagnosis not present

## 2024-02-15 DIAGNOSIS — F419 Anxiety disorder, unspecified: Secondary | ICD-10-CM | POA: Diagnosis not present

## 2024-02-15 MED ORDER — DULOXETINE HCL 60 MG PO CPEP: 60.0000 mg | ORAL_CAPSULE | Freq: Every day | ORAL | 0 refills | Status: DC

## 2024-02-15 MED ORDER — DULOXETINE HCL 30 MG PO CPEP: 30.0000 mg | ORAL_CAPSULE | ORAL | 0 refills | Status: DC

## 2024-02-15 MED ORDER — TEMAZEPAM 22.5 MG PO CAPS: 22.5000 mg | ORAL_CAPSULE | Freq: Every evening | ORAL | 2 refills | Status: DC | PRN

## 2024-02-15 NOTE — Progress Notes (Signed)
 [facility] MD Virtual Progress Note   Patient Location: home Provider Location: Office  I connect with patient by video and verified that I am speaking with correct person by using two identifiers. I discussed the limitations of evaluation and management by telemedicine and the availability of in person appointments. I also discussed with the patient that there may be a patient responsible charge related to this service. The patient expressed understanding and agreed to proceed.  Margaret Rojas [id number] 48 y.o.  02/15/2024 1:21 PM  History of Present Illness:  Patient is evaluated by video session.  She reported few hours of sleep with the temazepam  and biggest concern is falling asleep.  Patient reported her sleep was initially doing very well but lately she noticed difficulty falling asleep.  Otherwise things are going very well.  She excited about upcoming girls trip.  She denies any crying spells or any feeling of hopelessness or worthlessness.  Since she stopped the Ambien  she noticed her neuropathy is much better.  She is still taking gabapentin  but cut down the dose.  Patient told her daughter trip to [place] was very well and she was happy that her daughter was able to go to [place] with the school.  She still take one fourth Klonopin  when she feels very nervous or anxious.  She is wondering if she can go up on the temazepam .  She denies drinking or using any illegal substances.  She is taking Cymbalta  90 mg daily.  Her depression is stable but she still feels nervous or anxious.  She has no tremors shakes or any EPS.  She admitted some time get distracted and wondering if she has ADHD.  However her biggest concern is insomnia which she believes could be perimenopausal.  Past Psychiatric History: H/O depression since 2005.  Took Lexapro in pregnancy, took Zoloft  until switched to Effexor  after diagnosed breast cancer in 2018. We switched to Cymbalta  for better control  of pain.  Tried Xanax  and Lunesta  ( metallic taste). No history of suicidal attempt or inpatient psychiatric treatment.    Outpatient Encounter Medications as of 02/15/2024  Medication Sig   anastrozole  (ARIMIDEX ) 1 MG tablet Take 1 tablet (1 mg total) by mouth daily.   clonazePAM  (KLONOPIN ) 0.25 MG disintegrating tablet Take 1 tablet (0.25 mg total) by mouth as needed (anxiety).   cyclobenzaprine  (FLEXERIL ) 10 MG tablet Take 10 mg by mouth 2 (two) times daily as needed (migraines).   DULoxetine  (CYMBALTA ) 30 MG capsule Take 1 capsule (30 mg total) by mouth See admin instructions. Take with 60 mg for a total of 90 mg in the morning   DULoxetine  (CYMBALTA ) 60 MG capsule Take 1 capsule (60 mg total) by mouth daily. Take along with 30 mg daily   ELDERBERRY PO Take 3 capsules by mouth at bedtime.   gabapentin  (NEURONTIN ) 300 MG capsule Take 1 capsule (300 mg total) by mouth 2 (two) times daily.   ibuprofen  (ADVIL ) 200 MG tablet Take 600 mg by mouth every 6 (six) hours as needed for headache.   NUCYNTA 50 MG tablet Take 50 mg by mouth at bedtime as needed.   rosuvastatin  (CRESTOR ) 10 MG tablet Take 1 tablet (10 mg total) by mouth daily.   temazepam  (RESTORIL ) 15 MG capsule Take 1 capsule (15 mg total) by mouth at bedtime as needed for sleep.   topiramate  (TOPAMAX ) 100 MG tablet Take 100 mg by mouth at bedtime.   traMADol  (ULTRAM ) 50 MG tablet Take 1 tablet (50  mg total) by mouth every 6 (six) hours as needed for moderate pain or severe pain.   VEOZAH 45 MG TABS Take 1 tablet by mouth daily.   zolmitriptan (ZOMIG) 5 MG tablet Take 5 mg by mouth as needed for migraine.   No facility-administered encounter medications on file as of 02/15/2024.    Recent Results (from the past 2160 hours)  CBC with Differential/Platelet     Status: None   Collection Time: 11/25/23  3:20 PM  Result Value Ref Range   WBC 6.7 4.0 - 10.5 K/uL   RBC 4.61 3.87 - 5.11 Mil/uL   Hemoglobin 14.2 12.0 - 15.0 g/dL   HCT 01.6  01.0 - 93.2 %   MCV 93.7 78.0 - 100.0 fl   MCHC 33.0 30.0 - 36.0 g/dL   RDW 35.5 73.2 - 20.2 %   Platelets 269.0 150.0 - 400.0 K/uL   Neutrophils Relative % 52.4 43.0 - 77.0 %   Lymphocytes Relative 34.9 12.0 - 46.0 %   Monocytes Relative 9.4 3.0 - 12.0 %   Eosinophils Relative 2.7 0.0 - 5.0 %   Basophils Relative 0.6 0.0 - 3.0 %   Neutro Abs 3.5 1.4 - 7.7 K/uL   Lymphs Abs 2.3 0.7 - 4.0 K/uL   Monocytes Absolute 0.6 0.1 - 1.0 K/uL   Eosinophils Absolute 0.2 0.0 - 0.7 K/uL   Basophils Absolute 0.0 0.0 - 0.1 K/uL  Comprehensive metabolic panel     Status: Abnormal   Collection Time: 11/25/23  3:20 PM  Result Value Ref Range   Sodium 140 135 - 145 mEq/L   Potassium 4.5 3.5 - 5.1 mEq/L   Chloride 103 96 - 112 mEq/L   CO2 28 19 - 32 mEq/L   Glucose, Bld 97 70 - 99 mg/dL   BUN 27 (H) 6 - 23 mg/dL   Creatinine, Ser 5.42 0.40 - 1.20 mg/dL   Total Bilirubin 0.2 0.2 - 1.2 mg/dL   Alkaline Phosphatase 90 39 - 117 U/L   AST 15 0 - 37 U/L   ALT 15 0 - 35 U/L   Total Protein 7.1 6.0 - 8.3 g/dL   Albumin  4.4 3.5 - 5.2 g/dL   GFR 70.62 >37.62 mL/min    Comment: Calculated using the CKD-EPI Creatinine Equation (2021)   Calcium  9.4 8.4 - 10.5 mg/dL  Lipid Panel     Status: Abnormal   Collection Time: 11/25/23  3:20 PM  Result Value Ref Range   Cholesterol 260 (H) 0 - 200 mg/dL    Comment: ATP III Classification       Desirable:  < 200 mg/dL               Borderline High:  200 - 239 mg/dL          High:  > = 831 mg/dL   Triglycerides 51.7 0.0 - 149.0 mg/dL    Comment: Normal:  <616 mg/dLBorderline High:  150 - 199 mg/dL   HDL 07.37 >10.62 mg/dL   VLDL 69.4 0.0 - 85.4 mg/dL   LDL Cholesterol 627 (H) 0 - 99 mg/dL   Total CHOL/HDL Ratio 3     Comment:                Men          Women1/2 Average Risk     3.4          3.3Average Risk          5.0  4.42X Average Risk          9.6          7.13X Average Risk          15.0          11.0                       NonHDL 184.15     Comment:  NOTE:  Non-HDL goal should be 30 mg/dL higher than patient's LDL goal (i.e. LDL goal of < 70 mg/dL, would have non-HDL goal of < 100 mg/dL)  TSH     Status: None   Collection Time: 11/25/23  3:20 PM  Result Value Ref Range   TSH 0.78 0.35 - 5.50 uIU/mL  Hepatitis C Antibody     Status: None   Collection Time: 11/25/23  3:20 PM  Result Value Ref Range   Hepatitis C Ab NON-REACTIVE NON-REACTIVE    Comment: . HCV antibody was non-reactive. There is no laboratory  evidence of HCV infection. . In most cases, no further action is required. However, if recent HCV exposure is suspected, a test for HCV RNA (test code 40981) is suggested. . For additional information please refer to http://education.[facility].com/faq/FAQ22v1 (This link is being provided for informational/ educational purposes only.) .      Psychiatric Specialty Exam: Physical Exam  Review of Systems  There were no vitals taken for this visit.There is no height or weight on file to calculate BMI.  General Appearance: Casual  Eye Contact:  Good  Speech:  Clear and Coherent  Volume:  Normal  Mood:  Euthymic  Affect:  Appropriate  Thought Process:  Goal Directed  Orientation:  Full (Time, Place, and Person)  Thought Content:  WDL  Suicidal Thoughts:  No  Homicidal Thoughts:  No  Memory:  Immediate;   Good Recent;   Good Remote;   Good  Judgement:  Good  Insight:  Good  Psychomotor Activity:  Normal  Concentration:  Concentration: Good and Attention Span: Good  Recall:  Good  Fund of Knowledge:  Good  Language:  Good  Akathisia:  No  Handed:  Right  AIMS (if indicated):     Assets:  Communication Skills Desire for Improvement Housing Social Support Talents/Skills Transportation  ADL's:  Intact  Cognition:  WNL  Sleep:  difficulty falling sleep       11/25/2023    2:30 PM 07/30/2018    2:37 PM 11/09/2017    2:34 PM 07/21/2017    2:04 PM 03/06/2017    2:52 PM  Depression screen PHQ 2/9   Decreased Interest 0 1 0 0 1  Down, Depressed, Hopeless 0 1 0 1 2  PHQ - 2 Score 0 2 0 1 3  Altered sleeping 3 2   2   Tired, decreased energy 1 2   3   Change in appetite 1 0   1  Feeling bad or failure about yourself  1 0   1  Trouble concentrating 2 1   1   Moving slowly or fidgety/restless 1 0   1  Suicidal thoughts 0 0   0  PHQ-9 Score 9 7   12   Difficult doing work/chores Somewhat difficult        Assessment/Plan: Anxiety - Plan: DULoxetine  (CYMBALTA ) 30 MG capsule, DULoxetine  (CYMBALTA ) 60 MG capsule  MDD (major depressive disorder), recurrent episode, mild (HCC) - Plan: DULoxetine  (CYMBALTA ) 30 MG capsule  Primary insomnia - Plan: temazepam  (RESTORIL ) 22.5  MG capsule  Discussed medication and the side effects.  She like to go up on temazepam .  She also cut down the gabapentin  because she noticed since stopped the Ambien  she does not have neuropathy.  I explained gabapentin  also helped the anxiety and sleep but patient do not feel it did help her sleep.  I recommend we can try temazepam  22.5 mg but she should stop taking the Klonopin  which she usually take one fourth when she has unable to sleep very well.  She agreed with the plan.  Will keep the Cymbalta  90 mg daily.  She had lost weight since the last visit and otherwise she feel good energy, motivated to do things.  Denies any major panic attack.  Continue Cymbalta  90 mg daily and try temazepam  22.5 mg at bedtime.  Recommend to call us  back if she is any question or any concern.  Follow-up in 3 months.   Follow Up Instructions:     I discussed the assessment and treatment plan with the patient. The patient was provided an opportunity to ask questions and all were answered. The patient agreed with the plan and demonstrated an understanding of the instructions.   The patient was advised to call back or seek an in-person evaluation if the symptoms worsen or if the condition fails to improve as anticipated.    Collaboration of  Care: Other provider involved in patient's care AEB notes are available in epic to review  Patient/Guardian was advised Release of Information must be obtained prior to any record release in order to collaborate their care with an outside provider. Patient/Guardian was advised if they have not already done so to contact the registration department to sign all necessary forms in order for us  to release information regarding their care.   Consent: Patient/Guardian gives verbal consent for treatment and assignment of benefits for services provided during this visit. Patient/Guardian expressed understanding and agreed to proceed.     Total encounter time 18 minutes which includes face-to-face time, chart reviewed, care coordination, order entry and documentation during this encounter.   Note: This document was prepared by [employer] voice dictation technology and any errors that results from this process are unintentional.    Arturo Late, MD 02/15/2024

## 2024-03-22 ENCOUNTER — Telehealth (HOSPITAL_COMMUNITY): Admitting: Psychiatry

## 2024-03-29 ENCOUNTER — Telehealth: Payer: Self-pay

## 2024-03-29 NOTE — Telephone Encounter (Signed)
 Copied from CRM [id number]. Topic: Appointments - Appointment Scheduling >> Mar 29, 2024  4:13 PM Rosina BIRCH wrote: Reason for RMF:[id number] called stating she need lab work done for her cholesterol medication that she started two months ago and she want to get her BUN tested because it has been high. Patient also want to know if the doctor does lab work for hormones [phone]

## 2024-03-29 NOTE — Telephone Encounter (Signed)
 I think the lab orders are in for bmet and lipid and ast, alt   If she wants hormone testing- come for visit so we can talk about what she wants

## 2024-03-30 NOTE — Telephone Encounter (Signed)
 Spoke to pt, relayed Dr. Graham message. Pt states she would have the labs done, that are already ordered. Pt stated once the results are back, she'll sch an ov to review them over with Dr. Randeen along with discussing the hormones labs. Sch labs for 04/19/24

## 2024-03-30 NOTE — Telephone Encounter (Signed)
 Please schedule f/u with PCP to discuss hormone labs and we can do all labs at that visit

## 2024-04-19 ENCOUNTER — Ambulatory Visit: Payer: Self-pay | Admitting: Family Medicine

## 2024-04-19 ENCOUNTER — Other Ambulatory Visit (INDEPENDENT_AMBULATORY_CARE_PROVIDER_SITE_OTHER)

## 2024-04-19 DIAGNOSIS — R799 Abnormal finding of blood chemistry, unspecified: Secondary | ICD-10-CM | POA: Diagnosis not present

## 2024-04-19 DIAGNOSIS — E785 Hyperlipidemia, unspecified: Secondary | ICD-10-CM | POA: Diagnosis not present

## 2024-04-19 LAB — BASIC METABOLIC PANEL WITH GFR
BUN: 20 mg/dL (ref 6–23)
CO2: 30 meq/L (ref 19–32)
Calcium: 9.5 mg/dL (ref 8.4–10.5)
Chloride: 106 meq/L (ref 96–112)
Creatinine, Ser: 0.88 mg/dL (ref 0.40–1.20)
GFR: 77.85 mL/min (ref >60.00)
Glucose, Bld: 92 mg/dL (ref 70–99)
Potassium: 4.4 meq/L (ref 3.5–5.1)
Sodium: 142 meq/L (ref 135–145)

## 2024-04-19 LAB — LIPID PANEL
Cholesterol: 163 mg/dL (ref 0–200)
HDL: 62.9 mg/dL (ref >39.00)
LDL Cholesterol: 81 mg/dL (ref 0–99)
NonHDL: 100.21
Total CHOL/HDL Ratio: 3
Triglycerides: 94 mg/dL (ref 0.0–149.0)
VLDL: 18.8 mg/dL (ref 0.0–40.0)

## 2024-04-19 LAB — AST: AST: 13 U/L (ref 0–37)

## 2024-04-19 LAB — ALT: ALT: 12 U/L (ref 0–35)

## 2024-05-09 ENCOUNTER — Encounter: Payer: Self-pay | Admitting: Hematology and Oncology

## 2024-05-09 ENCOUNTER — Other Ambulatory Visit: Payer: Self-pay | Admitting: *Deleted

## 2024-05-09 ENCOUNTER — Encounter: Payer: Self-pay | Admitting: Family Medicine

## 2024-05-09 ENCOUNTER — Telehealth (INDEPENDENT_AMBULATORY_CARE_PROVIDER_SITE_OTHER): Admitting: Family Medicine

## 2024-05-09 DIAGNOSIS — G43009 Migraine without aura, not intractable, without status migrainosus: Secondary | ICD-10-CM

## 2024-05-09 DIAGNOSIS — Z170 Estrogen receptor positive status [ER+]: Secondary | ICD-10-CM

## 2024-05-09 DIAGNOSIS — Z803 Family history of malignant neoplasm of breast: Secondary | ICD-10-CM

## 2024-05-09 NOTE — Assessment & Plan Note (Signed)
 4 times per month in post menopausal pt with breast cancer taking anastozole  Triggered by weather change and caffeine  Left sided   Takes  Topamax  150 mg daily Zomig 5 prn Flexeril  10 prn  On cymbalta  for mental health Nycynta and gabapentin  for unrelated neuro pain   Has not had trigger point inj or botox   Interested in new neuro practice Will place referral   Encouraged good headache lifestyle habits   Call back and Er precautions noted in detail today

## 2024-05-09 NOTE — Patient Instructions (Signed)
 I put the referral in for neurology Please let us  know if you don't hear in 1-2 weeks to set that up (mychart message or call or letter)   Take care of yourself Keep thinking about quitting smoking  Nicotine gum/lozenge or inhaler may be helpful in the future   Stay hydrated Continue to avoid caffeine

## 2024-05-09 NOTE — Progress Notes (Signed)
 Virtual Visit via Video Note  I connected with Margaret Rojas on 05/09/24 at  8:00 AM EDT by a video enabled telemedicine application and verified that I am speaking with the correct person using two identifiers.  Patient Location: Home Provider Location: Office/Clinic  I discussed the limitations, risks, security, and privacy concerns of performing an evaluation and management service by video and the availability of in person appointments. I also discussed with the patient that there may be a patient responsible charge related to this service. The patient expressed understanding and agreed to proceed.  Parties involved in encounter  Patient: Margaret Rojas  Provider:  Laine Balls MD   Subjective: PCP: Balls Laine LABOR, MD  Chief Complaint  Patient presents with   Referral    Discuss referral for new Migraine doc   HPI Pt presents for c/o migraines   [facility]   Is interested in Dr Ines at [facility]    Has not done cgrp medicines Has not had botox   Would like to come off of topamax    Migraines are worse in the past month  At least 4 times per month  Usually if she takes something at night will be better in am  Always on left  No aura  Some nausea  Light sensitivity if it gets bad   They get bad about twice monthly   Got worse after her hysterectomy   Triggered by weather change  Caffeine- has absolutely none   Has had to use zomig twice in the past month    Topamax  150 mg daily  Gabapentin  Nucynta -for nerve pain  Advil  -taking daily for hip problem - seeing Dr Vernetta in sept  Cymbalta  - daily / mood  Zomig  Flexeril    Has not done trigger point injections    Arimidex  for breast cancer  Had hysterectomy with BSE 10/2022   Smoking status  3-4 cig per day (am, when returning from work, walking dog at night)  Wants to quit  May consider nicotine inhaler or gum  Started seeing  nutritionist also  Eating better Gave up processed sugar  Exercising more if tolerated   ROS: Per HPI Review of Systems  Constitutional:  Negative for chills, fever and malaise/fatigue.  HENT:  Negative for congestion, ear pain, sinus pain and sore throat.   Eyes:  Negative for blurred vision, discharge and redness.  Respiratory:  Negative for cough, shortness of breath and stridor.   Cardiovascular:  Negative for chest pain, palpitations and leg swelling.  Gastrointestinal:  Negative for abdominal pain, diarrhea, nausea and vomiting.  Musculoskeletal:  Negative for myalgias.  Skin:  Negative for rash.  Neurological:  Positive for headaches. Negative for dizziness, sensory change, speech change, focal weakness and seizures.  Psychiatric/Behavioral:         Mood is stable     Current Outpatient Medications:    anastrozole  (ARIMIDEX ) 1 MG tablet, Take 1 tablet (1 mg total) by mouth daily., Disp: 90 tablet, Rfl: 3   cyclobenzaprine  (FLEXERIL ) 10 MG tablet, Take 10 mg by mouth 2 (two) times daily as needed (migraines)., Disp: , Rfl:    DULoxetine  (CYMBALTA ) 30 MG capsule, Take 1 capsule (30 mg total) by mouth See admin instructions. Take with 60 mg for a total of 90 mg in the morning, Disp: 90 capsule, Rfl: 0   DULoxetine  (CYMBALTA ) 60 MG capsule, Take 1 capsule (60 mg total) by mouth daily. Take  along with 30 mg daily, Disp: 90 capsule, Rfl: 0   ELDERBERRY PO, Take 3 capsules by mouth at bedtime., Disp: , Rfl:    gabapentin  (NEURONTIN ) 300 MG capsule, Take 1 capsule (300 mg total) by mouth 2 (two) times daily., Disp: , Rfl:    ibuprofen  (ADVIL ) 200 MG tablet, Take 600 mg by mouth every 6 (six) hours as needed for headache., Disp: , Rfl:    NUCYNTA 50 MG tablet, Take 50 mg by mouth at bedtime as needed., Disp: , Rfl:    rosuvastatin  (CRESTOR ) 10 MG tablet, Take 1 tablet (10 mg total) by mouth daily., Disp: 90 tablet, Rfl: 3   temazepam  (RESTORIL ) 22.5 MG capsule, Take 1 capsule (22.5 mg  total) by mouth at bedtime as needed for sleep., Disp: 30 capsule, Rfl: 2   topiramate  (TOPAMAX ) 100 MG tablet, Take 150 mg by mouth daily., Disp: , Rfl:    VEOZAH 45 MG TABS, Take 1 tablet by mouth daily., Disp: , Rfl:    zolmitriptan (ZOMIG) 5 MG tablet, Take 5 mg by mouth as needed for migraine., Disp: , Rfl:   Observations/Objective: There were no vitals filed for this visit. Physical Exam Patient appears well, in no distress Weight is baseline  No facial swelling or asymmetry Normal voice-not hoarse and no slurred speech No obvious tremor or mobility impairment Moving neck and UEs normally Able to hear the call well  No cough or shortness of breath during interview  Talkative and mentally sharp with no cognitive changes No skin changes on face or neck , no rash or pallor Affect is normal    Assessment and Plan: Migraine without aura and without status migrainosus, not intractable Assessment & Plan: 4 times per month in post menopausal pt with breast cancer taking anastozole  Triggered by weather change and caffeine  Left sided   Takes  Topamax  150 mg daily Zomig 5 prn Flexeril  10 prn  On cymbalta  for mental health Nycynta and gabapentin  for unrelated neuro pain   Has not had trigger point inj or botox   Interested in new neuro practice Will place referral   Encouraged good headache lifestyle habits   Call back and Er precautions noted in detail today    Orders: -     Ambulatory referral to Neurology    Follow Up Instructions: No follow-ups on file.  I put the referral in for neurology Please let us  know if you don't hear in 1-2 weeks to set that up (mychart message or call or letter)   Take care of yourself Keep thinking about quitting smoking  Nicotine gum/lozenge or inhaler may be helpful in the future   Stay hydrated Continue to avoid caffeine     I discussed the assessment and treatment plan with the patient. The patient was provided an  opportunity to ask questions, and all were answered. The patient agreed with the plan and demonstrated an understanding of the instructions.   The patient was advised to call back or seek an in-person evaluation if the symptoms worsen or if the condition fails to improve as anticipated.  The above assessment and management plan was discussed with the patient. The patient verbalized understanding of and has agreed to the management plan.   Laine Balls, MD

## 2024-05-10 ENCOUNTER — Other Ambulatory Visit: Payer: Self-pay | Admitting: Hematology and Oncology

## 2024-05-10 DIAGNOSIS — Z803 Family history of malignant neoplasm of breast: Secondary | ICD-10-CM

## 2024-05-10 DIAGNOSIS — Z170 Estrogen receptor positive status [ER+]: Secondary | ICD-10-CM

## 2024-05-17 ENCOUNTER — Encounter (HOSPITAL_COMMUNITY): Payer: Self-pay | Admitting: Psychiatry

## 2024-05-17 ENCOUNTER — Telehealth (HOSPITAL_BASED_OUTPATIENT_CLINIC_OR_DEPARTMENT_OTHER): Admitting: Psychiatry

## 2024-05-17 VITALS — Wt 196.0 lb

## 2024-05-17 DIAGNOSIS — F419 Anxiety disorder, unspecified: Secondary | ICD-10-CM

## 2024-05-17 DIAGNOSIS — F330 Major depressive disorder, recurrent, mild: Secondary | ICD-10-CM | POA: Diagnosis not present

## 2024-05-17 DIAGNOSIS — F5101 Primary insomnia: Secondary | ICD-10-CM

## 2024-05-17 MED ORDER — DULOXETINE HCL 30 MG PO CPEP: 30.0000 mg | ORAL_CAPSULE | ORAL | 0 refills | Status: DC

## 2024-05-17 MED ORDER — TEMAZEPAM 30 MG PO CAPS: 30.0000 mg | ORAL_CAPSULE | Freq: Every evening | ORAL | 2 refills | Status: DC | PRN

## 2024-05-17 MED ORDER — DULOXETINE HCL 60 MG PO CPEP
60.0000 mg | ORAL_CAPSULE | Freq: Every day | ORAL | 0 refills | Status: AC
Start: 2024-05-17 — End: ?

## 2024-05-17 MED ORDER — HYDROXYZINE HCL 10 MG PO TABS: 10.0000 mg | ORAL_TABLET | Freq: Two times a day (BID) | ORAL | 1 refills | Status: DC | PRN
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
## 2024-05-24 ENCOUNTER — Ambulatory Visit
Admission: RE | Admit: 2024-05-24 | Discharge: 2024-05-24 | Disposition: A | Discharge status: Home / Self Care | Source: Ambulatory Visit | Attending: Hematology and Oncology

## 2024-05-25 ENCOUNTER — Ambulatory Visit: Admitting: Orthopaedic Surgery

## 2024-05-25 ENCOUNTER — Other Ambulatory Visit (INDEPENDENT_AMBULATORY_CARE_PROVIDER_SITE_OTHER): Payer: Self-pay

## 2024-05-25 DIAGNOSIS — M25551 Pain in right hip: Secondary | ICD-10-CM

## 2024-05-25 MED ORDER — LIDOCAINE HCL 1 % IJ SOLN
3.0000 mL | INTRAMUSCULAR | Status: AC | PRN
  Administered 2024-05-25: 3 mL

## 2024-05-25 MED ORDER — METHYLPREDNISOLONE ACETATE 40 MG/ML IJ SUSP
40.0000 mg | INTRAMUSCULAR | Status: AC | PRN
  Administered 2024-05-25: 40 mg via INTRA_ARTICULAR

## 2024-05-25 NOTE — Progress Notes (Signed)
 The patient is a very pleasant and active 48 year old female who comes in with right hip pain for about 8 or 9 months now.  She did see another orthopedic group and she was started on a steroid taper but has not had any other anti-inflammatories and not any formal physical therapy.  She really denies any groin pain says her pain is getting pretty constant and she points to the lateral aspect of her right hip as a source of her pain.  She has never had surgery on this area.  She is not a diabetic.  I was able to review her past medical history and medications within epic.  On examination her right and left hips move smoothly and fluidly with no blocks or rotation no pain in the groin at all.  When I do have her lay with her left side down her right side up she does have significant pain to palpation all around the greater trochanteric area and the proximal IT band.  An AP pelvis and lateral of the right hip shows normal-appearing hips bilaterally.  There are no cortical irregularities around the trochanteric area of either hip.  Her signs and symptoms seem to be consistent more with right hip trochanteric bursitis and IT band syndrome.  I recommend a steroid injection in this area which she agreed to and tolerated well.  Also recommended topical anti-inflammatory such as Voltaren gel and a Salonpas patch.  Also, it is worth setting up physical therapy with any modalities that therapist can perform to help decrease her trochanteric pain on the right hip.  We will then see her back in 4 weeks after course of therapy and see how she has done with the injection as well.  All question concerns were answered and addressed.    Procedure Note  Patient: Margaret Rojas             Date of Birth: 15-Jun-1976           MRN: [id number]             Visit Date: 05/25/2024  Procedures: Visit Diagnoses:  1. Pain in right hip     Large Joint Inj: R greater trochanter on 05/25/2024 8:56 AM Indications: pain  and diagnostic evaluation Details: 22 G 1.5 in needle, lateral approach  Arthrogram: No  Medications: 3 mL lidocaine  1 %; 40 mg methylPREDNISolone  acetate 40 MG/ML Outcome: tolerated well, no immediate complications Procedure, treatment alternatives, risks and benefits explained, specific risks discussed. Consent was given by the patient. Immediately prior to procedure a time out was called to verify the correct patient, procedure, equipment, support staff and site/side marked as required. Patient was prepped and draped in the usual sterile fashion.

## 2024-05-26 ENCOUNTER — Other Ambulatory Visit: Payer: Self-pay

## 2024-05-26 DIAGNOSIS — M25551 Pain in right hip: Secondary | ICD-10-CM

## 2024-05-29 ENCOUNTER — Other Ambulatory Visit (HOSPITAL_COMMUNITY): Payer: Self-pay | Admitting: Psychiatry

## 2024-05-29 DIAGNOSIS — F419 Anxiety disorder, unspecified: Secondary | ICD-10-CM

## 2024-06-09 ENCOUNTER — Encounter: Payer: Self-pay | Admitting: Rehabilitative and Restorative Service Providers"

## 2024-06-09 ENCOUNTER — Ambulatory Visit (INDEPENDENT_AMBULATORY_CARE_PROVIDER_SITE_OTHER): Admitting: Rehabilitative and Restorative Service Providers"

## 2024-06-09 DIAGNOSIS — R600 Localized edema: Secondary | ICD-10-CM | POA: Diagnosis not present

## 2024-06-09 DIAGNOSIS — M6281 Muscle weakness (generalized): Secondary | ICD-10-CM

## 2024-06-09 DIAGNOSIS — R262 Difficulty in walking, not elsewhere classified: Secondary | ICD-10-CM | POA: Diagnosis not present

## 2024-06-09 DIAGNOSIS — M25551 Pain in right hip: Secondary | ICD-10-CM | POA: Diagnosis not present
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
## 2024-06-16 ENCOUNTER — Encounter: Payer: Self-pay | Admitting: Rehabilitative and Restorative Service Providers"

## 2024-06-16 ENCOUNTER — Encounter: Payer: Self-pay | Admitting: Hematology and Oncology

## 2024-06-16 ENCOUNTER — Ambulatory Visit (INDEPENDENT_AMBULATORY_CARE_PROVIDER_SITE_OTHER): Admitting: Rehabilitative and Restorative Service Providers"

## 2024-06-16 DIAGNOSIS — R262 Difficulty in walking, not elsewhere classified: Secondary | ICD-10-CM | POA: Diagnosis not present

## 2024-06-16 DIAGNOSIS — M25551 Pain in right hip: Secondary | ICD-10-CM | POA: Diagnosis not present

## 2024-06-16 DIAGNOSIS — M6281 Muscle weakness (generalized): Secondary | ICD-10-CM | POA: Diagnosis not present

## 2024-06-16 DIAGNOSIS — R600 Localized edema: Secondary | ICD-10-CM | POA: Diagnosis not present
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
## 2024-06-22 ENCOUNTER — Other Ambulatory Visit: Payer: Self-pay

## 2024-06-22 ENCOUNTER — Encounter: Payer: Self-pay | Admitting: Orthopaedic Surgery

## 2024-06-22 ENCOUNTER — Ambulatory Visit (INDEPENDENT_AMBULATORY_CARE_PROVIDER_SITE_OTHER): Admitting: Orthopaedic Surgery

## 2024-06-22 DIAGNOSIS — M7061 Trochanteric bursitis, right hip: Secondary | ICD-10-CM | POA: Diagnosis not present

## 2024-06-22 DIAGNOSIS — M25551 Pain in right hip: Secondary | ICD-10-CM

## 2024-06-22 MED ORDER — CELECOXIB 200 MG PO CAPS: 200.0000 mg | ORAL_CAPSULE | Freq: Two times a day (BID) | ORAL | 1 refills | Status: DC | PRN

## 2024-06-22 NOTE — Progress Notes (Signed)
 The patient is a very active 48 year old female who is seen in follow-up with right hip pain which is accommodation of tendinitis and trochanteric bursitis.  She has had steroid injections in this area and has been on anti-inflammatories and has worked on activity modification and rest.  She has now been in formal physical therapy with all modalities being tried.  She is continue to have right hip pain and discomfort in spite of conservative treatment and efforts for several months now.  On exam her right hip moves smoothly and fluidly but she does have significant pain all around the trochanteric area that is concerning enough that this needs to be explored further given the failure conservative treatment.  Again her previous x-rays of her pelvis and right hip are normal in terms of the hip joint space and no significant irregularities around the trochanteric area of the right hip.  Due to his continued severe pain over the trochanteric area we are concerned about the potential for gluteus medius and minimus tear.  At this point a MRI is warranted of the right hip to assess those tendons and ligaments so we can come up with a better treatment plan.  Will send in some Celebrex as an anti-inflammatory to try different anti-inflammatory and we will see her back once we have the MRI of the right hip.  This is medically warranted now given the failure of all modes of conservative treatment including formal physical therapy.

## 2024-06-23 ENCOUNTER — Encounter: Payer: Self-pay | Admitting: Rehabilitative and Restorative Service Providers"

## 2024-06-23 ENCOUNTER — Ambulatory Visit (INDEPENDENT_AMBULATORY_CARE_PROVIDER_SITE_OTHER): Admitting: Rehabilitative and Restorative Service Providers"

## 2024-06-23 DIAGNOSIS — R262 Difficulty in walking, not elsewhere classified: Secondary | ICD-10-CM

## 2024-06-23 DIAGNOSIS — M6281 Muscle weakness (generalized): Secondary | ICD-10-CM | POA: Diagnosis not present

## 2024-06-23 DIAGNOSIS — M25551 Pain in right hip: Secondary | ICD-10-CM | POA: Diagnosis not present

## 2024-06-23 DIAGNOSIS — R600 Localized edema: Secondary | ICD-10-CM
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
## 2024-06-27 ENCOUNTER — Other Ambulatory Visit (HOSPITAL_COMMUNITY): Payer: Self-pay | Admitting: Psychiatry

## 2024-06-27 DIAGNOSIS — F419 Anxiety disorder, unspecified: Secondary | ICD-10-CM

## 2024-06-30 ENCOUNTER — Ambulatory Visit (INDEPENDENT_AMBULATORY_CARE_PROVIDER_SITE_OTHER): Admitting: Rehabilitative and Restorative Service Providers"

## 2024-06-30 ENCOUNTER — Encounter: Payer: Self-pay | Admitting: Rehabilitative and Restorative Service Providers"

## 2024-06-30 DIAGNOSIS — M25551 Pain in right hip: Secondary | ICD-10-CM | POA: Diagnosis not present

## 2024-06-30 DIAGNOSIS — M6281 Muscle weakness (generalized): Secondary | ICD-10-CM | POA: Diagnosis not present

## 2024-06-30 DIAGNOSIS — R600 Localized edema: Secondary | ICD-10-CM

## 2024-06-30 DIAGNOSIS — R262 Difficulty in walking, not elsewhere classified: Secondary | ICD-10-CM
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
## 2024-07-01 ENCOUNTER — Other Ambulatory Visit (HOSPITAL_COMMUNITY): Payer: Self-pay | Admitting: Psychiatry

## 2024-07-01 DIAGNOSIS — F419 Anxiety disorder, unspecified: Secondary | ICD-10-CM

## 2024-07-04 ENCOUNTER — Encounter: Payer: Self-pay | Admitting: Hematology and Oncology

## 2024-07-05 ENCOUNTER — Other Ambulatory Visit: Payer: Self-pay | Admitting: Medical Genetics

## 2024-07-06 ENCOUNTER — Other Ambulatory Visit

## 2024-07-07 ENCOUNTER — Ambulatory Visit: Admitting: Rehabilitative and Restorative Service Providers"

## 2024-07-07 ENCOUNTER — Encounter: Payer: Self-pay | Admitting: Rehabilitative and Restorative Service Providers"

## 2024-07-07 DIAGNOSIS — M6281 Muscle weakness (generalized): Secondary | ICD-10-CM

## 2024-07-07 DIAGNOSIS — R600 Localized edema: Secondary | ICD-10-CM

## 2024-07-07 DIAGNOSIS — R262 Difficulty in walking, not elsewhere classified: Secondary | ICD-10-CM | POA: Diagnosis not present

## 2024-07-07 DIAGNOSIS — M25551 Pain in right hip: Secondary | ICD-10-CM
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
## 2024-07-10 ENCOUNTER — Ambulatory Visit
Admission: RE | Admit: 2024-07-10 | Discharge: 2024-07-10 | Disposition: A | Discharge status: Home / Self Care | Source: Ambulatory Visit | Attending: Orthopaedic Surgery | Admitting: Orthopaedic Surgery

## 2024-07-10 DIAGNOSIS — M25551 Pain in right hip: Secondary | ICD-10-CM

## 2024-07-14 ENCOUNTER — Encounter: Admitting: Rehabilitative and Restorative Service Providers"

## 2024-07-18 ENCOUNTER — Encounter: Payer: Self-pay | Admitting: Radiology

## 2024-07-21 ENCOUNTER — Encounter: Admitting: Rehabilitative and Restorative Service Providers"

## 2024-07-29 ENCOUNTER — Ambulatory Visit
Admission: RE | Admit: 2024-07-29 | Discharge: 2024-07-29 | Disposition: A | Discharge status: Home / Self Care | Source: Ambulatory Visit | Attending: Hematology and Oncology | Admitting: Hematology and Oncology

## 2024-07-29 DIAGNOSIS — Z803 Family history of malignant neoplasm of breast: Secondary | ICD-10-CM

## 2024-07-29 DIAGNOSIS — C50412 Malignant neoplasm of upper-outer quadrant of left female breast: Secondary | ICD-10-CM

## 2024-07-29 MED ORDER — GADOPICLENOL 0.5 MMOL/ML IV SOLN
9.0000 mL | Freq: Once | INTRAVENOUS | Status: AC | PRN
  Administered 2024-07-29: 9 mL via INTRAVENOUS

## 2024-08-01 ENCOUNTER — Ambulatory Visit (INDEPENDENT_AMBULATORY_CARE_PROVIDER_SITE_OTHER): Admitting: Orthopaedic Surgery

## 2024-08-01 ENCOUNTER — Telehealth: Payer: Self-pay | Admitting: Hematology and Oncology

## 2024-08-01 ENCOUNTER — Encounter: Payer: Self-pay | Admitting: Orthopaedic Surgery

## 2024-08-01 DIAGNOSIS — M25551 Pain in right hip: Secondary | ICD-10-CM | POA: Diagnosis not present

## 2024-08-01 DIAGNOSIS — M7061 Trochanteric bursitis, right hip: Secondary | ICD-10-CM | POA: Diagnosis not present

## 2024-08-01 NOTE — Progress Notes (Signed)
 The patient comes in today to go over MRI of her right hip.  She is a very pleasant and active 48 year old female who has been developing pain over the trochanteric area and the proximal IT band of her right hip for some time now.  Physical therapy has been helpful and steroid injection was helpful but has continued to have some discomfort.  She does look like she is heading in the right direction but she has had to be an active and that is not sustainable for her.  She would like to be more active.  She has dealt with breast cancer as well as a hysterectomy and there may be a component of menopause that causes some of her joint aches and pains.  MRI of her right hip does show mild tendinosis and tendinitis of the insertion of the gluteus medius and minimus tendons but no tear.  The remainder of her hip was normal on MRI.  We went over the MRI findings and I do feel that she is a good candidate for being set up to see Dr. Burnetta here in the office for soft tissue treatments around her right hip and I think this will continue to improve with these treatment modalities.  She is amenable to this treatment plan so we will work on getting an appoint with Dr. Burnetta.

## 2024-08-01 NOTE — Telephone Encounter (Signed)
 Informed that the breast MRI is normal

## 2024-08-04 ENCOUNTER — Encounter: Payer: Self-pay | Admitting: Rehabilitative and Restorative Service Providers"

## 2024-08-04 ENCOUNTER — Ambulatory Visit (INDEPENDENT_AMBULATORY_CARE_PROVIDER_SITE_OTHER): Admitting: Rehabilitative and Restorative Service Providers"

## 2024-08-04 DIAGNOSIS — M25551 Pain in right hip: Secondary | ICD-10-CM | POA: Diagnosis not present

## 2024-08-04 DIAGNOSIS — R262 Difficulty in walking, not elsewhere classified: Secondary | ICD-10-CM | POA: Diagnosis not present

## 2024-08-04 DIAGNOSIS — M6281 Muscle weakness (generalized): Secondary | ICD-10-CM | POA: Diagnosis not present

## 2024-08-04 DIAGNOSIS — R600 Localized edema: Secondary | ICD-10-CM | POA: Diagnosis not present
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
## 2024-08-09 ENCOUNTER — Ambulatory Visit (INDEPENDENT_AMBULATORY_CARE_PROVIDER_SITE_OTHER): Admitting: Sports Medicine

## 2024-08-09 ENCOUNTER — Encounter: Payer: Self-pay | Admitting: Sports Medicine

## 2024-08-09 DIAGNOSIS — G8929 Other chronic pain: Secondary | ICD-10-CM

## 2024-08-09 DIAGNOSIS — M67951 Unspecified disorder of synovium and tendon, right thigh: Secondary | ICD-10-CM

## 2024-08-09 DIAGNOSIS — M7631 Iliotibial band syndrome, right leg: Secondary | ICD-10-CM

## 2024-08-09 DIAGNOSIS — M25551 Pain in right hip: Secondary | ICD-10-CM
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
## 2024-08-09 NOTE — Progress Notes (Signed)
 Patient has had right lateral hip pain that has improved overtime with an injection and physical therapy. She is still having pain during and after activity. She says that she does feel stronger with the physical therapy, and she no longer has pain all the time. She takes Celebrex  and uses ice, and will take other pain medicine as prescribed by pain management as needed. She is here to discuss and try other soft tissue treatments.

## 2024-08-09 NOTE — Patient Instructions (Signed)
 Creatine Monohydrate 5 grams/day (same thing as 5000 mg / day)

## 2024-08-10 ENCOUNTER — Encounter: Admitting: Rehabilitative and Restorative Service Providers"

## 2024-08-16 ENCOUNTER — Telehealth (HOSPITAL_COMMUNITY): Admitting: Psychiatry

## 2024-08-16 ENCOUNTER — Encounter (HOSPITAL_COMMUNITY): Payer: Self-pay | Admitting: Psychiatry

## 2024-08-16 VITALS — Wt 191.0 lb

## 2024-08-16 DIAGNOSIS — F5101 Primary insomnia: Secondary | ICD-10-CM

## 2024-08-16 DIAGNOSIS — F419 Anxiety disorder, unspecified: Secondary | ICD-10-CM | POA: Diagnosis not present

## 2024-08-16 DIAGNOSIS — F330 Major depressive disorder, recurrent, mild: Secondary | ICD-10-CM | POA: Diagnosis not present

## 2024-08-16 MED ORDER — TEMAZEPAM 30 MG PO CAPS: 30.0000 mg | ORAL_CAPSULE | Freq: Every evening | ORAL | 2 refills | Status: AC | PRN

## 2024-08-16 MED ORDER — DULOXETINE HCL 30 MG PO CPEP: 90.0000 mg | ORAL_CAPSULE | ORAL | 0 refills | Status: DC

## 2024-08-16 MED ORDER — HYDROXYZINE HCL 10 MG PO TABS: 10.0000 mg | ORAL_TABLET | Freq: Every day | ORAL | 0 refills | Status: AC | PRN
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
## 2024-08-18 ENCOUNTER — Encounter: Admitting: Rehabilitative and Restorative Service Providers"

## 2024-08-18 ENCOUNTER — Other Ambulatory Visit: Payer: Self-pay | Admitting: Orthopaedic Surgery

## 2024-08-18 ENCOUNTER — Other Ambulatory Visit (HOSPITAL_COMMUNITY): Payer: Self-pay | Admitting: *Deleted

## 2024-08-18 DIAGNOSIS — F330 Major depressive disorder, recurrent, mild: Secondary | ICD-10-CM

## 2024-08-18 DIAGNOSIS — F419 Anxiety disorder, unspecified: Secondary | ICD-10-CM

## 2024-08-18 MED ORDER — DULOXETINE HCL 30 MG PO CPEP: 30.0000 mg | ORAL_CAPSULE | ORAL | 2 refills | Status: AC

## 2024-08-22 ENCOUNTER — Ambulatory Visit: Admitting: Sports Medicine

## 2024-08-22 ENCOUNTER — Encounter: Payer: Self-pay | Admitting: Sports Medicine

## 2024-08-22 DIAGNOSIS — M67951 Unspecified disorder of synovium and tendon, right thigh: Secondary | ICD-10-CM

## 2024-08-22 DIAGNOSIS — G8929 Other chronic pain: Secondary | ICD-10-CM
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
## 2024-08-22 NOTE — Progress Notes (Signed)
 Patient says that she did not notice any change or improvement after her first shockwave treatment. She denies any soreness or tenderness after her last appointment. She has one more session of physical therapy this week, and continues to do her home exercises consistently. She is here for repeat treatment today.

## 2024-08-25 ENCOUNTER — Ambulatory Visit: Admitting: Rehabilitative and Restorative Service Providers"

## 2024-08-25 ENCOUNTER — Encounter: Payer: Self-pay | Admitting: Rehabilitative and Restorative Service Providers"

## 2024-08-25 DIAGNOSIS — R600 Localized edema: Secondary | ICD-10-CM | POA: Diagnosis not present

## 2024-08-25 DIAGNOSIS — M6281 Muscle weakness (generalized): Secondary | ICD-10-CM | POA: Diagnosis not present

## 2024-08-25 DIAGNOSIS — R262 Difficulty in walking, not elsewhere classified: Secondary | ICD-10-CM

## 2024-08-25 DIAGNOSIS — M25551 Pain in right hip: Secondary | ICD-10-CM | POA: Diagnosis not present
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
## 2024-09-02 ENCOUNTER — Other Ambulatory Visit (HOSPITAL_COMMUNITY): Payer: Self-pay | Admitting: Psychiatry

## 2024-09-02 ENCOUNTER — Ambulatory Visit: Admitting: Sports Medicine

## 2024-09-02 DIAGNOSIS — M7631 Iliotibial band syndrome, right leg: Secondary | ICD-10-CM | POA: Diagnosis not present

## 2024-09-02 DIAGNOSIS — M67951 Unspecified disorder of synovium and tendon, right thigh: Secondary | ICD-10-CM

## 2024-09-02 DIAGNOSIS — M25551 Pain in right hip: Secondary | ICD-10-CM | POA: Diagnosis not present

## 2024-09-02 DIAGNOSIS — G8929 Other chronic pain: Secondary | ICD-10-CM

## 2024-09-02 DIAGNOSIS — F419 Anxiety disorder, unspecified: Secondary | ICD-10-CM
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
## 2024-09-02 NOTE — Progress Notes (Signed)
 Patient says that the second shockwave treatment seemed to be more helpful than the first. She feels about 75% improved overall. She says that she does have a twinge, but has also had pain for so long that she does not know how pain-free should feel. She is here for follow up and repeat treatment today.

## 2024-09-11 ENCOUNTER — Other Ambulatory Visit (HOSPITAL_COMMUNITY): Payer: Self-pay | Admitting: Psychiatry

## 2024-09-11 DIAGNOSIS — F419 Anxiety disorder, unspecified: Secondary | ICD-10-CM

## 2024-09-22 ENCOUNTER — Ambulatory Visit: Admitting: Sports Medicine

## 2024-09-22 ENCOUNTER — Ambulatory Visit: Admitting: Neurology

## 2024-09-29 ENCOUNTER — Encounter: Admitting: Rehabilitative and Restorative Service Providers"

## 2024-09-30 ENCOUNTER — Encounter: Payer: Self-pay | Admitting: Sports Medicine

## 2024-09-30 ENCOUNTER — Ambulatory Visit: Payer: Self-pay | Admitting: Sports Medicine

## 2024-09-30 DIAGNOSIS — M7631 Iliotibial band syndrome, right leg: Secondary | ICD-10-CM

## 2024-09-30 DIAGNOSIS — M67951 Unspecified disorder of synovium and tendon, right thigh: Secondary | ICD-10-CM

## 2024-09-30 DIAGNOSIS — M25551 Pain in right hip: Secondary | ICD-10-CM

## 2024-09-30 DIAGNOSIS — G8929 Other chronic pain: Secondary | ICD-10-CM

## 2024-09-30 NOTE — Progress Notes (Signed)
" ° °  Procedure Note  Patient: Margaret Rojas             Date of Birth: 06/29/1976           MRN: [id number]             Visit Date: 09/30/2024  Here for ECSWT #4 today. Tolerated 3 treatments well, pain is minimal but has been inactive recently 2/2 illness.  Procedures: Visit Diagnoses:  1. Chronic right hip pain   2. Tendinopathy of right gluteal region   3. It band syndrome, right    Procedure: ECSWT Indications:  GTPS, Gluteal tendinopathy   Procedure Details Consent: Risks of procedure as well as the alternatives and risks of each were explained to the patient.  Verbal consent for procedure obtained. Time Out: Verified patient identification, verified procedure, site was marked, verified correct patient position. The area was cleaned with alcohol swab.     The right greater trochanteric region/gluteal tendons and proximal ITB was targeted for Extracorporeal shockwave therapy.    Preset: Trochanteric bursitis/tendinopathy Power Level: 120 mJ Frequency: 14 Hz Impulse/cycles: 3500 Head size: Regular   Patient tolerated procedure well without immediate complications.  - return to routine activity next few weeks - continue HEP x6 weeks - will update me how she is doing, can consider additional 1-2 ECSWT if feels she needs  Lonell Sprang, DO Primary Care Sports Medicine Physician  [facility] - Orthopedics  This note was dictated using Dragon naturally speaking software and may contain errors in syntax, spelling, or content which have not been identified prior to signing this note.    "

## 2024-09-30 NOTE — Progress Notes (Signed)
 Patient says that she has not had much pain, but she has also been inactive as she has been sick. She does not feel 100% as she still feels twinges, but overall does feel better than she did at her last visit. She is here for repeat shockwave treatment today.

## 2024-10-09 ENCOUNTER — Other Ambulatory Visit: Payer: Self-pay | Admitting: Orthopaedic Surgery

## 2024-10-11 ENCOUNTER — Ambulatory Visit: Payer: Self-pay

## 2024-10-11 NOTE — Telephone Encounter (Signed)
 Spoke with pt. I offered her a video visit with Tabitha tomorrow 10/12/24, pt declined. Cough has been present for so long and has been seen at urgent care, she only wants to see Dr. Randeen in office. Pt has been scheduled to see Dr. Randeen on 10/13/24 at 0930 per her request.

## 2024-10-11 NOTE — Telephone Encounter (Signed)
 FYI Only or Action Required?: Action required by provider: request for appointment.  Patient was last seen in primary care on 05/09/2024 by Randeen Laine LABOR, MD.  Called Nurse Triage reporting No chief complaint on file..  Symptoms began several weeks ago.  Interventions attempted: Nothing.  Symptoms are: stable.  Triage Disposition: No disposition on file.  Patient/caregiver understands and will follow disposition?:   Reason for Triage: patient has been sick since around [place] and states she is still having a bad cough and lung pain. She isn't having trouble breathing but does had chest pain on and off.   Reason for Disposition  Chest pain  (Exception: MILD central chest pain, present only when coughing.)  Answer Assessment - Initial Assessment Questions Pt with left side cp, constant. Cough x 4 wks. Declines ED. No answer when tried to call [facility].   1. ONSET: When did the cough begin?      Several wks ago 2. SEVERITY: How bad is the cough today?      mild 3. SPUTUM: Describe the color of your sputum (e.g., none, dry cough; clear, white, yellow, green)      4. HEMOPTYSIS: Are you coughinReason for Triage: patient has been sick since around christmas and states she is still having a bad cough and lung pain. She isn't having trouble breathing but does had chest pain on and offg up any blood? If Yes, ask: How much? (e.g., flecks, streaks, tablespoons, etc.)      5. DIFFICULTY BREATHING: Are you having difficulty breathing? If Yes, ask: How bad is it? (e.g., mild, moderate, severe)      denies 6. FEVER: Do you have a fever? If Yes, ask: What is your temperature, how was it measured, and when did it start?     no 7. CARDIAC HISTORY: Do you have any history of heart disease? (e.g., heart attack, congestive heart failure)      no 8. LUNG HISTORY: Do you have any history of lung disease?  (e.g., pulmonary embolus, asthma, emphysema)     no 9. PE RISK FACTORS: Do  you have a history of blood clots? (or: recent major surgery, recent prolonged travel, bedridden)      10. OTHER SYMPTOMS: Do you have any other symptoms? (e.g., runny nose, wheezing, chest pain)       Left side cp, constant, worsens with deep breath and certain movements.  11. PREGNANCY: Is there any chance you are pregnant? When was your last menstrual period?        12. TRAVEL: Have you traveled out of the country in the last month? (e.g., travel history, exposures)  Protocols used: Cough - Acute Productive-A-AH

## 2024-10-13 ENCOUNTER — Encounter: Payer: Self-pay | Admitting: Family Medicine

## 2024-10-13 ENCOUNTER — Ambulatory Visit: Admitting: Family Medicine

## 2024-10-13 ENCOUNTER — Ambulatory Visit: Payer: Self-pay | Admitting: Family Medicine

## 2024-10-13 ENCOUNTER — Ambulatory Visit (INDEPENDENT_AMBULATORY_CARE_PROVIDER_SITE_OTHER)
Admission: RE | Admit: 2024-10-13 | Discharge: 2024-10-13 | Disposition: A | Discharge status: Home / Self Care | Source: Ambulatory Visit | Attending: Family Medicine | Admitting: Family Medicine

## 2024-10-13 VITALS — BP 122/56 | HR 62 | Temp 98.6°F | Ht 67.0 in | Wt 200.4 lb

## 2024-10-13 DIAGNOSIS — R0789 Other chest pain: Secondary | ICD-10-CM | POA: Insufficient documentation

## 2024-10-13 DIAGNOSIS — J069 Acute upper respiratory infection, unspecified: Secondary | ICD-10-CM | POA: Insufficient documentation

## 2024-10-13 NOTE — Assessment & Plan Note (Addendum)
 Since upper respiratory infection that began in late December Has had 2 urgent care visits, treated with prednisone  and antibiotic/Omnicef Last chest x-ray was clear Neg covid and flu tests  Since then left anterior chest wall pain is worsened, aggravated by deep breaths and cough, with a recent popping sensation when she leaned over Exam is reassuring overall Plan chest and rib film now and will reach out with results Encouraged use of warm compress, Delsym for cough Encouraged to use her Celebrex  200 mg twice daily, but avoid ibuprofen  since it is in the same class Disc symptomatic care - see instructions on AVS   I personally spent a total of 32 minutes in the care of the patient today including preparing to see the patient, getting/reviewing separately obtained history, performing a medically appropriate exam/evaluation, counseling and educating, placing orders, documenting clinical information in the EHR, and communicating results.   Call back and Er precautions noted in detail today
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
## 2024-10-13 NOTE — Patient Instructions (Signed)
 For cough suppression = try delsym over the counter   Stay hydrated  Use some warm compresses on painful area when you can    Chest xray /rib now  We will reach out with results    If symptoms suddenly worsen and you have trouble breathing, go to the ER

## 2024-10-13 NOTE — Assessment & Plan Note (Signed)
 Since late December when patient was on a cruise, with 2 urgent care visits, prednisone  and Omnicef Had normal chest x-ray Since then more left anterior chest wall pain Other symptoms are otherwise improving Chest x-ray and rib films planned today  Recommend Delsym for cough as needed

## 2024-10-20 ENCOUNTER — Encounter: Payer: Self-pay | Admitting: Family Medicine

## 2024-10-20 ENCOUNTER — Inpatient Hospital Stay: Admission: RE | Admit: 2024-10-20 | Discharge: 2024-10-20 | Attending: Family Medicine | Admitting: Family Medicine

## 2024-10-20 ENCOUNTER — Encounter: Payer: Self-pay | Admitting: Hematology and Oncology

## 2024-10-20 ENCOUNTER — Other Ambulatory Visit

## 2024-10-20 ENCOUNTER — Telehealth: Payer: Self-pay | Admitting: Hematology and Oncology

## 2024-10-20 DIAGNOSIS — R0789 Other chest pain: Secondary | ICD-10-CM

## 2024-10-20 NOTE — Telephone Encounter (Signed)
 I spoke with patient and she is aware of rescheduled MD appointment from 11/14/2024 to 11/29/2024.

## 2024-10-21 ENCOUNTER — Ambulatory Visit: Payer: Self-pay | Admitting: Family Medicine

## 2024-10-21 MED ORDER — BENZONATATE 200 MG PO CAPS: 200.0000 mg | ORAL_CAPSULE | Freq: Three times a day (TID) | ORAL | 1 refills | Status: AC | PRN

## 2024-10-22 ENCOUNTER — Inpatient Hospital Stay: Admitting: Hematology and Oncology

## 2024-11-14 ENCOUNTER — Inpatient Hospital Stay: Payer: 59 | Admitting: Hematology and Oncology

## 2024-11-15 ENCOUNTER — Telehealth (HOSPITAL_COMMUNITY): Admitting: Psychiatry

## 2024-11-29 ENCOUNTER — Inpatient Hospital Stay: Admitting: Hematology and Oncology
